# Patient Record
Sex: Female | Born: 1965 | Race: White | Hispanic: No | Marital: Single | Smoking: Never smoker
Health system: Southern US, Community
[De-identification: ages and names within clinical notes are randomized; demographics above are authoritative.]

## PROBLEM LIST (undated history)

## (undated) DIAGNOSIS — F419 Anxiety disorder, unspecified: Secondary | ICD-10-CM

## (undated) DIAGNOSIS — D649 Anemia, unspecified: Secondary | ICD-10-CM

## (undated) DIAGNOSIS — N289 Disorder of kidney and ureter, unspecified: Secondary | ICD-10-CM

## (undated) DIAGNOSIS — C539 Malignant neoplasm of cervix uteri, unspecified: Secondary | ICD-10-CM

## (undated) DIAGNOSIS — I10 Essential (primary) hypertension: Secondary | ICD-10-CM

## (undated) DIAGNOSIS — I82409 Acute embolism and thrombosis of unspecified deep veins of unspecified lower extremity: Secondary | ICD-10-CM

## (undated) DIAGNOSIS — Z87442 Personal history of urinary calculi: Secondary | ICD-10-CM

## (undated) HISTORY — PX: TUBAL LIGATION: SHX77

## (undated) HISTORY — PX: OTHER SURGICAL HISTORY: SHX169

## (undated) HISTORY — PX: TONSILLECTOMY: SUR1361

---

## 2013-01-04 DIAGNOSIS — T83098A Other mechanical complication of other indwelling urethral catheter, initial encounter: Secondary | ICD-10-CM | POA: Insufficient documentation

## 2013-01-04 DIAGNOSIS — I10 Essential (primary) hypertension: Secondary | ICD-10-CM | POA: Insufficient documentation

## 2013-01-04 DIAGNOSIS — C569 Malignant neoplasm of unspecified ovary: Secondary | ICD-10-CM | POA: Insufficient documentation

## 2013-01-04 DIAGNOSIS — N99528 Other complication of other external stoma of urinary tract: Secondary | ICD-10-CM | POA: Insufficient documentation

## 2014-01-25 DIAGNOSIS — R809 Proteinuria, unspecified: Secondary | ICD-10-CM | POA: Insufficient documentation

## 2014-12-26 DIAGNOSIS — IMO0002 Reserved for concepts with insufficient information to code with codable children: Secondary | ICD-10-CM | POA: Insufficient documentation

## 2014-12-26 DIAGNOSIS — Z01818 Encounter for other preprocedural examination: Secondary | ICD-10-CM | POA: Insufficient documentation

## 2016-12-03 ENCOUNTER — Encounter (HOSPITAL_COMMUNITY): Payer: Self-pay | Admitting: *Deleted

## 2016-12-03 ENCOUNTER — Inpatient Hospital Stay (HOSPITAL_COMMUNITY)
Admission: EM | Admit: 2016-12-03 | Discharge: 2016-12-14 | DRG: 252 | Disposition: A | Discharge status: Home / Self Care | Payer: Medicare Other | Attending: Internal Medicine | Admitting: Internal Medicine

## 2016-12-03 DIAGNOSIS — Y92239 Unspecified place in hospital as the place of occurrence of the external cause: Secondary | ICD-10-CM | POA: Diagnosis present

## 2016-12-03 DIAGNOSIS — I120 Hypertensive chronic kidney disease with stage 5 chronic kidney disease or end stage renal disease: Secondary | ICD-10-CM | POA: Diagnosis present

## 2016-12-03 DIAGNOSIS — A419 Sepsis, unspecified organism: Secondary | ICD-10-CM

## 2016-12-03 DIAGNOSIS — N99820 Postprocedural hemorrhage and hematoma of a genitourinary system organ or structure following a genitourinary system procedure: Secondary | ICD-10-CM | POA: Diagnosis present

## 2016-12-03 DIAGNOSIS — Z881 Allergy status to other antibiotic agents status: Secondary | ICD-10-CM | POA: Diagnosis not present

## 2016-12-03 DIAGNOSIS — R001 Bradycardia, unspecified: Secondary | ICD-10-CM | POA: Diagnosis present

## 2016-12-03 DIAGNOSIS — D509 Iron deficiency anemia, unspecified: Secondary | ICD-10-CM | POA: Diagnosis present

## 2016-12-03 DIAGNOSIS — T80212A Local infection due to central venous catheter, initial encounter: Principal | ICD-10-CM | POA: Diagnosis present

## 2016-12-03 DIAGNOSIS — Z923 Personal history of irradiation: Secondary | ICD-10-CM

## 2016-12-03 DIAGNOSIS — T827XXD Infection and inflammatory reaction due to other cardiac and vascular devices, implants and grafts, subsequent encounter: Secondary | ICD-10-CM | POA: Diagnosis not present

## 2016-12-03 DIAGNOSIS — E876 Hypokalemia: Secondary | ICD-10-CM | POA: Diagnosis present

## 2016-12-03 DIAGNOSIS — N2581 Secondary hyperparathyroidism of renal origin: Secondary | ICD-10-CM | POA: Diagnosis present

## 2016-12-03 DIAGNOSIS — Y832 Surgical operation with anastomosis, bypass or graft as the cause of abnormal reaction of the patient, or of later complication, without mention of misadventure at the time of the procedure: Secondary | ICD-10-CM | POA: Diagnosis not present

## 2016-12-03 DIAGNOSIS — Z8543 Personal history of malignant neoplasm of ovary: Secondary | ICD-10-CM

## 2016-12-03 DIAGNOSIS — E860 Dehydration: Secondary | ICD-10-CM | POA: Diagnosis present

## 2016-12-03 DIAGNOSIS — Z7901 Long term (current) use of anticoagulants: Secondary | ICD-10-CM | POA: Diagnosis not present

## 2016-12-03 DIAGNOSIS — R7881 Bacteremia: Secondary | ICD-10-CM

## 2016-12-03 DIAGNOSIS — D631 Anemia in chronic kidney disease: Secondary | ICD-10-CM | POA: Diagnosis present

## 2016-12-03 DIAGNOSIS — Z8614 Personal history of Methicillin resistant Staphylococcus aureus infection: Secondary | ICD-10-CM

## 2016-12-03 DIAGNOSIS — T82868A Thrombosis of vascular prosthetic devices, implants and grafts, initial encounter: Secondary | ICD-10-CM

## 2016-12-03 DIAGNOSIS — Z86718 Personal history of other venous thrombosis and embolism: Secondary | ICD-10-CM

## 2016-12-03 DIAGNOSIS — N186 End stage renal disease: Secondary | ICD-10-CM | POA: Diagnosis present

## 2016-12-03 DIAGNOSIS — Z6841 Body Mass Index (BMI) 40.0 and over, adult: Secondary | ICD-10-CM

## 2016-12-03 DIAGNOSIS — Z841 Family history of disorders of kidney and ureter: Secondary | ICD-10-CM

## 2016-12-03 DIAGNOSIS — B9562 Methicillin resistant Staphylococcus aureus infection as the cause of diseases classified elsewhere: Secondary | ICD-10-CM | POA: Diagnosis present

## 2016-12-03 DIAGNOSIS — Y712 Prosthetic and other implants, materials and accessory cardiovascular devices associated with adverse incidents: Secondary | ICD-10-CM | POA: Diagnosis not present

## 2016-12-03 DIAGNOSIS — T8579XA Infection and inflammatory reaction due to other internal prosthetic devices, implants and grafts, initial encounter: Secondary | ICD-10-CM | POA: Diagnosis present

## 2016-12-03 DIAGNOSIS — Z888 Allergy status to other drugs, medicaments and biological substances status: Secondary | ICD-10-CM

## 2016-12-03 DIAGNOSIS — Z9221 Personal history of antineoplastic chemotherapy: Secondary | ICD-10-CM | POA: Diagnosis not present

## 2016-12-03 DIAGNOSIS — L309 Dermatitis, unspecified: Secondary | ICD-10-CM | POA: Diagnosis present

## 2016-12-03 DIAGNOSIS — G8929 Other chronic pain: Secondary | ICD-10-CM | POA: Diagnosis not present

## 2016-12-03 DIAGNOSIS — E8889 Other specified metabolic disorders: Secondary | ICD-10-CM | POA: Diagnosis present

## 2016-12-03 DIAGNOSIS — K56609 Unspecified intestinal obstruction, unspecified as to partial versus complete obstruction: Secondary | ICD-10-CM | POA: Diagnosis present

## 2016-12-03 DIAGNOSIS — K435 Parastomal hernia without obstruction or  gangrene: Secondary | ICD-10-CM | POA: Diagnosis present

## 2016-12-03 DIAGNOSIS — R197 Diarrhea, unspecified: Secondary | ICD-10-CM | POA: Diagnosis present

## 2016-12-03 DIAGNOSIS — Z992 Dependence on renal dialysis: Secondary | ICD-10-CM | POA: Diagnosis not present

## 2016-12-03 DIAGNOSIS — Z5189 Encounter for other specified aftercare: Secondary | ICD-10-CM

## 2016-12-03 DIAGNOSIS — M549 Dorsalgia, unspecified: Secondary | ICD-10-CM | POA: Diagnosis not present

## 2016-12-03 HISTORY — DX: Acute embolism and thrombosis of unspecified deep veins of unspecified lower extremity: I82.409

## 2016-12-03 HISTORY — DX: Disorder of kidney and ureter, unspecified: N28.9

## 2016-12-03 HISTORY — DX: Malignant neoplasm of cervix uteri, unspecified: C53.9

## 2016-12-03 HISTORY — DX: Anxiety disorder, unspecified: F41.9

## 2016-12-03 LAB — CBC WITH DIFFERENTIAL/PLATELET
BASOS ABS: 0 10*3/uL (ref 0.0–0.1)
BASOS PCT: 0 %
Eosinophils Absolute: 0.2 10*3/uL (ref 0.0–0.7)
Eosinophils Relative: 3 %
HEMATOCRIT: 25 % — AB (ref 36.0–46.0)
HEMOGLOBIN: 8 g/dL — AB (ref 12.0–15.0)
Lymphocytes Relative: 14 %
Lymphs Abs: 1.1 10*3/uL (ref 0.7–4.0)
MCH: 31.1 pg (ref 26.0–34.0)
MCHC: 32 g/dL (ref 30.0–36.0)
MCV: 97.3 fL (ref 78.0–100.0)
MONO ABS: 0.5 10*3/uL (ref 0.1–1.0)
Monocytes Relative: 7 %
NEUTROS ABS: 5.9 10*3/uL (ref 1.7–7.7)
NEUTROS PCT: 76 %
Platelets: 314 10*3/uL (ref 150–400)
RBC: 2.57 MIL/uL — AB (ref 3.87–5.11)
RDW: 13.5 % (ref 11.5–15.5)
WBC: 7.7 10*3/uL (ref 4.0–10.5)

## 2016-12-03 LAB — I-STAT TROPONIN, ED: Troponin i, poc: 0.02 ng/mL (ref 0.00–0.08)

## 2016-12-03 LAB — COMPREHENSIVE METABOLIC PANEL
ALT: 21 U/L (ref 14–54)
ANION GAP: 16 — AB (ref 5–15)
AST: 41 U/L (ref 15–41)
Albumin: 3.1 g/dL — ABNORMAL LOW (ref 3.5–5.0)
Alkaline Phosphatase: 137 U/L — ABNORMAL HIGH (ref 38–126)
BUN: 14 mg/dL (ref 6–20)
CHLORIDE: 93 mmol/L — AB (ref 101–111)
CO2: 28 mmol/L (ref 22–32)
Calcium: 8.5 mg/dL — ABNORMAL LOW (ref 8.9–10.3)
Creatinine, Ser: 4.55 mg/dL — ABNORMAL HIGH (ref 0.44–1.00)
GFR, EST AFRICAN AMERICAN: 12 mL/min — AB (ref >60)
GFR, EST NON AFRICAN AMERICAN: 10 mL/min — AB (ref >60)
Glucose, Bld: 94 mg/dL (ref 65–99)
POTASSIUM: 2.8 mmol/L — AB (ref 3.5–5.1)
SODIUM: 137 mmol/L (ref 135–145)
Total Bilirubin: 0.5 mg/dL (ref 0.3–1.2)
Total Protein: 8.1 g/dL (ref 6.5–8.1)

## 2016-12-03 LAB — I-STAT BETA HCG BLOOD, ED (MC, WL, AP ONLY): I-stat hCG, quantitative: <5 m[IU]/mL (ref <5)

## 2016-12-03 LAB — URINALYSIS, ROUTINE W REFLEX MICROSCOPIC
Bilirubin Urine: NEGATIVE
Glucose, UA: NEGATIVE mg/dL
KETONES UR: NEGATIVE mg/dL
Nitrite: NEGATIVE
PH: 9 — AB (ref 5.0–8.0)
Protein, ur: 100 mg/dL — AB
Specific Gravity, Urine: 1.01 (ref 1.005–1.030)

## 2016-12-03 LAB — I-STAT CG4 LACTIC ACID, ED
LACTIC ACID, VENOUS: 2.77 mmol/L — AB (ref 0.5–1.9)
LACTIC ACID, VENOUS: 2.24 mmol/L — AB (ref 0.5–1.9)

## 2016-12-03 LAB — PROTIME-INR
INR: 1.14
Prothrombin Time: 14.5 seconds (ref 11.4–15.2)

## 2016-12-03 MED ORDER — SODIUM CHLORIDE 0.9 % IV BOLUS (SEPSIS)
500.0000 mL | Freq: Once | INTRAVENOUS | Status: AC
  Administered 2016-12-03: 500 mL via INTRAVENOUS

## 2016-12-03 NOTE — ED Triage Notes (Signed)
Pt had dialysis today. Sent here due to +blood cultures, +staph. Pt having fatigue and weakness x 4 days.

## 2016-12-03 NOTE — ED Notes (Signed)
Pt complaining of chest pain in waiting room.  Took back to triage for reassessment and EKG

## 2016-12-03 NOTE — ED Provider Notes (Signed)
[facility] EMERGENCY DEPARTMENT Provider Note   CSN: [id number] Arrival date & time: 12/03/16  1745     History   Chief Complaint Chief Complaint  Patient presents with  . Abnormal Lab    HPI Judy Kelley is a 51 y.o. female.  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  End-stage renal disease on dialysis.  Patient just moved here from [place].  Patient had a hemodialysis catheter placed in the right upper chest wall.  While her graft is maturing.  Her catheter recently became red, inflamed.  They drew positive blood cultures from it on 11/28, 2 days ago.  Patient has been feeling febrile, fatigued, below her dry weight for the last 5 days.  Patient appears clinically dehydrated, with signs and symptoms of infection.  Past Medical History:  Diagnosis Date  . Anxiety   . DVT (deep venous thrombosis) ([place])   . Renal disorder     There are no active problems to display for this patient.   History reviewed. No pertinent surgical history.  OB History    No data available       Home Medications    Prior to Admission medications   Medication Sig Start Date End Date Taking? Authorizing Provider  acetaminophen (TYLENOL) 500 MG tablet Take 1,000 mg by mouth every 6 (six) hours as needed for mild pain.   Yes [provider]  apixaban (ELIQUIS) 5 MG TABS tablet Take 5 mg by mouth 2 (two) times daily.   Yes [provider]  calcium acetate (PHOSLO) 667 MG capsule Take 667-1,334 mg by mouth See admin instructions. Takes 1334 with meals and 667 with snacks   Yes [provider]  cinacalcet (SENSIPAR) 90 MG tablet Take 90 mg by mouth daily.   Yes [provider]  oxycodone (OXY-IR) 5 MG capsule Take 5 mg by mouth every 4 (four) hours as needed.   Yes [provider]  promethazine (PHENERGAN) 25 MG tablet Take 25  mg by mouth every 6 (six) hours as needed for nausea or vomiting.   Yes [provider]  sertraline (ZOLOFT) 50 MG tablet Take 50 mg by mouth daily.   Yes [provider]    Family History History reviewed. No pertinent family history.  Social History Social History   Tobacco Use  . Smoking status: Never Smoker  Substance Use Topics  . Alcohol use: No    Frequency: Never  . Drug use: No     Allergies   Erythromycin; Ciprofloxacin; Chlorhexidine; and Other   Review of Systems Review of Systems  Constitutional: Positive for activity change and fatigue.  Respiratory: Negative for shortness of breath.   Cardiovascular: Negative for chest pain.  Gastrointestinal: Negative for abdominal pain.     Physical Exam Updated Vital Signs BP 135/75   Pulse 74   Temp 99.2 F (37.3 C) (Oral)   Resp 12   Ht 5\' 7"  (1.702 m)   Wt 116 kg (255 lb 11.7 oz)   SpO2 100%   BMI 40.05 kg/m   Physical Exam  Constitutional: She is oriented to person, place, and time. She appears well-developed.  HENT:  Head: Normocephalic and atraumatic.  Patient has very dry mucous membranes.  Eyes: Right eye exhibits no discharge. Left eye exhibits no discharge.  Cardiovascular: Normal rate and regular rhythm.  Pulmonary/Chest: Effort normal and breath sounds normal.  She has no wheezes. She has no rales.  Site of previous catheter withdrawal in the chest right chest upper wall  Abdominal: Soft. She exhibits no distension. There is no tenderness.  Musculoskeletal:  Graft left upper arm.  Neurological: She is oriented to person, place, and time.  Skin: Skin is warm and dry. She is not diaphoretic.  Psychiatric: She has a normal mood and affect.  Nursing note and vitals reviewed.    ED Treatments / Results  Labs (all labs ordered are listed, but only abnormal results are displayed) Labs Reviewed  COMPREHENSIVE METABOLIC PANEL - Abnormal; Notable for the following components:       Result Value   Potassium 2.8 (*)    Chloride 93 (*)    Creatinine, Ser 4.55 (*)    Calcium 8.5 (*)    Albumin 3.1 (*)    Alkaline Phosphatase 137 (*)    GFR calc non Af Amer 10 (*)    GFR calc Af Amer 12 (*)    Anion gap 16 (*)    All other components within normal limits  CBC WITH DIFFERENTIAL/PLATELET - Abnormal; Notable for the following components:   RBC 2.57 (*)    Hemoglobin 8.0 (*)    HCT 25.0 (*)    All other components within normal limits  URINALYSIS, ROUTINE W REFLEX MICROSCOPIC - Abnormal; Notable for the following components:   APPearance HAZY (*)    pH 9.0 (*)    Hgb urine dipstick SMALL (*)    Protein, ur 100 (*)    Leukocytes, UA LARGE (*)    Bacteria, UA RARE (*)    Squamous Epithelial / LPF 0-5 (*)    Non Squamous Epithelial 0-5 (*)    All other components within normal limits  I-STAT CG4 LACTIC ACID, ED - Abnormal; Notable for the following components:   Lactic Acid, Venous 2.77 (*)    All other components within normal limits  I-STAT CG4 LACTIC ACID, ED - Abnormal; Notable for the following components:   Lactic Acid, Venous 2.24 (*)    All other components within normal limits  CULTURE, BLOOD (ROUTINE X 2)  CULTURE, BLOOD (ROUTINE X 2)  PROTIME-INR  I-STAT BETA HCG BLOOD, ED ([facility], [facility], [facility] ONLY)  I-STAT TROPONIN, ED    EKG  EKG Interpretation None       Radiology No results found.  Procedures Procedures (including critical care time)  Medications Ordered in ED Medications - No data to display   Initial Impression / Assessment and Plan / ED Course  I have reviewed the triage vital signs and the nursing notes.  Pertinent labs & imaging results that were available during my care of the patient were reviewed by me and considered in my medical decision making (see chart for details).     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  End-stage renal  disease on dialysis.  Patient just moved here from [place].  Patient had a hemodialysis catheter placed in the right upper chest wall.  While her graft is maturing.  Her catheter recently became red, inflamed.  They drew positive blood cultures from it on 11/28, 2 days ago.  Patient has been feeling febrile, fatigued, below her dry weight for the last 5 days.  Patient appears clinically dehydrated, with signs and symptoms of infection.  11:13 PM Essentially patient was sent here by Veneta Penton from [facility] for hospitalizations  for positive cultures taken off a catheter 1128.  Blood cultures were redrawn today.  Patient has received 2 doses of Vancomycin (11/27 loading dose, 1,000 mg on 11/30)  but patient is having extreme fatigue, and decreased dry weight.  We will give gentle fluid bolus, 500 cc.  Admit.  Final Clinical Impressions(s) / ED Diagnoses   Final diagnoses:  None    ED Discharge Orders    None       Macarthur Critchley, MD 12/04/16 [phone]

## 2016-12-03 NOTE — ED Notes (Signed)
Santiago Glad @ [facility] notified of abnormal CG-4

## 2016-12-04 ENCOUNTER — Encounter (HOSPITAL_COMMUNITY): Payer: Self-pay | Admitting: Internal Medicine

## 2016-12-04 ENCOUNTER — Other Ambulatory Visit: Payer: Self-pay

## 2016-12-04 DIAGNOSIS — Z8543 Personal history of malignant neoplasm of ovary: Secondary | ICD-10-CM

## 2016-12-04 DIAGNOSIS — Z9221 Personal history of antineoplastic chemotherapy: Secondary | ICD-10-CM

## 2016-12-04 DIAGNOSIS — E876 Hypokalemia: Secondary | ICD-10-CM | POA: Diagnosis present

## 2016-12-04 DIAGNOSIS — N186 End stage renal disease: Secondary | ICD-10-CM

## 2016-12-04 DIAGNOSIS — Z833 Family history of diabetes mellitus: Secondary | ICD-10-CM

## 2016-12-04 DIAGNOSIS — Z992 Dependence on renal dialysis: Secondary | ICD-10-CM

## 2016-12-04 DIAGNOSIS — Z888 Allergy status to other drugs, medicaments and biological substances status: Secondary | ICD-10-CM

## 2016-12-04 DIAGNOSIS — Z841 Family history of disorders of kidney and ureter: Secondary | ICD-10-CM

## 2016-12-04 DIAGNOSIS — G8929 Other chronic pain: Secondary | ICD-10-CM

## 2016-12-04 DIAGNOSIS — Z881 Allergy status to other antibiotic agents status: Secondary | ICD-10-CM

## 2016-12-04 DIAGNOSIS — M549 Dorsalgia, unspecified: Secondary | ICD-10-CM

## 2016-12-04 DIAGNOSIS — Z8041 Family history of malignant neoplasm of ovary: Secondary | ICD-10-CM

## 2016-12-04 LAB — C DIFFICILE QUICK SCREEN W PCR REFLEX
C DIFFICILE (CDIFF) INTERP: NOT DETECTED
C DIFFICILE (CDIFF) TOXIN: NEGATIVE
C Diff antigen: NEGATIVE

## 2016-12-04 LAB — CBC
HCT: 22.1 % — ABNORMAL LOW (ref 36.0–46.0)
Hemoglobin: 7.1 g/dL — ABNORMAL LOW (ref 12.0–15.0)
MCH: 31.4 pg (ref 26.0–34.0)
MCHC: 32.1 g/dL (ref 30.0–36.0)
MCV: 97.8 fL (ref 78.0–100.0)
PLATELETS: 226 10*3/uL (ref 150–400)
RBC: 2.26 MIL/uL — ABNORMAL LOW (ref 3.87–5.11)
RDW: 13.7 % (ref 11.5–15.5)
WBC: 5.4 10*3/uL (ref 4.0–10.5)

## 2016-12-04 LAB — BASIC METABOLIC PANEL
Anion gap: 14 (ref 5–15)
BUN: 20 mg/dL (ref 6–20)
CALCIUM: 8.2 mg/dL — AB (ref 8.9–10.3)
CO2: 28 mmol/L (ref 22–32)
CREATININE: 6.24 mg/dL — AB (ref 0.44–1.00)
Chloride: 96 mmol/L — ABNORMAL LOW (ref 101–111)
GFR calc Af Amer: 8 mL/min — ABNORMAL LOW (ref >60)
GFR, EST NON AFRICAN AMERICAN: 7 mL/min — AB (ref >60)
GLUCOSE: 94 mg/dL (ref 65–99)
Potassium: 3 mmol/L — ABNORMAL LOW (ref 3.5–5.1)
SODIUM: 138 mmol/L (ref 135–145)

## 2016-12-04 LAB — HIV ANTIBODY (ROUTINE TESTING W REFLEX): HIV SCREEN 4TH GENERATION: NONREACTIVE

## 2016-12-04 LAB — VANCOMYCIN, RANDOM: VANCOMYCIN RM: 20

## 2016-12-04 LAB — MRSA PCR SCREENING: MRSA BY PCR: POSITIVE — AB

## 2016-12-04 LAB — VANCOMYCIN, TROUGH: VANCOMYCIN TR: 19 ug/mL (ref 15–20)

## 2016-12-04 MED ORDER — CALCIUM ACETATE (PHOS BINDER) 667 MG PO CAPS: 667.0000 mg | ORAL_CAPSULE | ORAL | Status: DC | PRN

## 2016-12-04 MED ORDER — MUPIROCIN 2 % EX OINT
1.0000 "application " | TOPICAL_OINTMENT | Freq: Two times a day (BID) | CUTANEOUS | Status: AC
  Administered 2016-12-04 – 2016-12-08 (×10): 1 via NASAL
  Filled 2016-12-04 (×3): qty 22

## 2016-12-04 MED ORDER — PROMETHAZINE HCL 25 MG PO TABS: 25.0000 mg | ORAL_TABLET | Freq: Four times a day (QID) | ORAL | Status: DC | PRN

## 2016-12-04 MED ORDER — APIXABAN 5 MG PO TABS
5.0000 mg | ORAL_TABLET | Freq: Two times a day (BID) | ORAL | Status: DC
  Administered 2016-12-04 – 2016-12-05 (×4): 5 mg via ORAL
  Filled 2016-12-04 (×5): qty 1

## 2016-12-04 MED ORDER — OXYCODONE HCL 5 MG PO TABS
5.0000 mg | ORAL_TABLET | ORAL | Status: DC | PRN
  Administered 2016-12-04 – 2016-12-14 (×25): 5 mg via ORAL
  Filled 2016-12-04 (×25): qty 1

## 2016-12-04 MED ORDER — CALCITRIOL 0.5 MCG PO CAPS: 1.0000 ug | ORAL_CAPSULE | ORAL | Status: DC

## 2016-12-04 MED ORDER — CALCITRIOL 0.5 MCG PO CAPS
1.0000 ug | ORAL_CAPSULE | ORAL | Status: DC
  Administered 2016-12-06 – 2016-12-10 (×3): 1 ug via ORAL
  Filled 2016-12-04: qty 2

## 2016-12-04 MED ORDER — ACETAMINOPHEN 325 MG PO TABS
650.0000 mg | ORAL_TABLET | Freq: Four times a day (QID) | ORAL | Status: DC | PRN
  Filled 2016-12-04: qty 2

## 2016-12-04 MED ORDER — CALCIUM ACETATE (PHOS BINDER) 667 MG PO CAPS
1334.0000 mg | ORAL_CAPSULE | Freq: Three times a day (TID) | ORAL | Status: DC
  Administered 2016-12-04 – 2016-12-14 (×19): 1334 mg via ORAL
  Filled 2016-12-04 (×21): qty 2

## 2016-12-04 MED ORDER — ONDANSETRON HCL 4 MG PO TABS
4.0000 mg | ORAL_TABLET | Freq: Four times a day (QID) | ORAL | Status: DC | PRN
  Administered 2016-12-05 – 2016-12-12 (×2): 4 mg via ORAL
  Filled 2016-12-04 (×2): qty 1

## 2016-12-04 MED ORDER — RENA-VITE PO TABS
1.0000 | ORAL_TABLET | Freq: Every day | ORAL | Status: DC
  Administered 2016-12-04 – 2016-12-13 (×10): 1 via ORAL
  Filled 2016-12-04 (×10): qty 1

## 2016-12-04 MED ORDER — POTASSIUM CHLORIDE CRYS ER 20 MEQ PO TBCR
40.0000 meq | EXTENDED_RELEASE_TABLET | Freq: Once | ORAL | Status: AC
  Administered 2016-12-04: 40 meq via ORAL
  Filled 2016-12-04: qty 2

## 2016-12-04 MED ORDER — VANCOMYCIN HCL IN DEXTROSE 1-5 GM/200ML-% IV SOLN
1000.0000 mg | INTRAVENOUS | Status: DC
  Administered 2016-12-06: 1000 mg via INTRAVENOUS
  Filled 2016-12-04 (×2): qty 200

## 2016-12-04 MED ORDER — ACETAMINOPHEN 650 MG RE SUPP: 650.0000 mg | Freq: Four times a day (QID) | RECTAL | Status: DC | PRN

## 2016-12-04 MED ORDER — CINACALCET HCL 30 MG PO TABS
90.0000 mg | ORAL_TABLET | Freq: Every day | ORAL | Status: DC
  Administered 2016-12-04 – 2016-12-14 (×10): 90 mg via ORAL
  Filled 2016-12-04 (×11): qty 3

## 2016-12-04 MED ORDER — SERTRALINE HCL 50 MG PO TABS
50.0000 mg | ORAL_TABLET | Freq: Every day | ORAL | Status: DC
  Administered 2016-12-04 – 2016-12-14 (×11): 50 mg via ORAL
  Filled 2016-12-04 (×11): qty 1

## 2016-12-04 MED ORDER — ONDANSETRON HCL 4 MG/2ML IJ SOLN
4.0000 mg | Freq: Four times a day (QID) | INTRAMUSCULAR | Status: DC | PRN
Start: 2016-12-04 — End: 2016-12-14
  Administered 2016-12-04 – 2016-12-11 (×4): 4 mg via INTRAVENOUS
  Filled 2016-12-04 (×4): qty 2

## 2016-12-04 NOTE — Progress Notes (Signed)
Pt is draining a moderate amount of blood from abdomen incision where there is a "non-functioning drain" per pt. Dressing has been changed twice.   Eleanora Neighbor, RN

## 2016-12-04 NOTE — Progress Notes (Signed)
Initial Nutrition Assessment  DOCUMENTATION CODES:   Morbid obesity  INTERVENTION:  - Continue to encourage PO intakes. - RD will continue to monitor for needs and will provide interventions, if warranted, at follow-up.   NUTRITION DIAGNOSIS:   Inadequate oral intake related to acute illness, nausea, vomiting, diarrhea as evidenced by per patient/family report, meal completion < 25%.  GOAL:   Patient will meet greater than or equal to 90% of their needs  MONITOR:   PO intake, Weight trends, Labs, Skin  REASON FOR ASSESSMENT:   Malnutrition Screening Tool  ASSESSMENT:   51 y.o. female with history of ESRD on hemodialysis, history of DVT, previous history of ovarian cancer in remission who had just recently moved from [place] was found to have redness around her right subclavian dialysis catheter 3 days ago and had associated fever and chills.  Blood cultures were obtained at dialysis center and patient was empirically started on vancomycin.  Patient had received her second dose of vancomycin today at dialysis center.  Cultures came back as staph aureus today and was instructed to come to the ER.  Over the last 2 days patient also has been having poor appetite with nausea vomiting and diarrhea.  Denies any abdominal pain but has chronic pain around the right flank area where her nephrostomy tube is.  Patient's dialysis catheter was removed 2 days ago by vascular surgeon.  BMI indicates morbid obesity. No intakes documented since admission. Breakfast tray at bedside and pt had consumed ~50% of a small cup of grits and other items untouched. Pt reports that for the past 1 week she has felt unwell and that she has felt worse each day until admission. Because of this, she had a decreased appetite and intakes (RD unable to elicit meal or snack intakes PTA at this time). Pt states that she is beginning to feel better and is not experiencing nausea this AM. She wants to "go slow" and not push  herself with eating, but is confident that she will soon be eating at baseline as she feels that antibiotic is working very well for her.  Physical assessment showed no signs of muscle or fat wasting. Pt reports usual dry weight of 120 kg and she feels she last weighed this 1 week PTA. No previous weight hx available in the chart. Per pt report and current weight, she has lost 4 kg/9 lbs (3.4% body weight) in the past 1 week. This is significant for time frame. Unable to state malnutrition at this time as unable to confirm a second criterion for identification.  Medications reviewed; Phoslo TID with meals and PRN with snacks, 90 mg Sensipar with breakfast.  Labs reviewed; K: 3 mmol/L, Cl: 96 mmol/L, creatinine: 6.24 mg/dL, Ca: 8.2 mg/dL, GFR: 7 mL/min.     NUTRITION - FOCUSED PHYSICAL EXAM:  Completed; no findings of muscle or fat wasting at this time.   Diet Order:  Diet renal with fluid restriction Fluid restriction: 1200 mL Fluid; Room service appropriate? Yes; Fluid consistency: Thin  EDUCATION NEEDS:   No education needs have been identified at this time  Skin:  Skin Assessment: Skin Integrity Issues: Skin Integrity Issues:: Incisions Incisions: R abdominal incision 11/30  Last BM:  11/30  Height:   Ht Readings from Last 1 Encounters:  12/04/16 5\' 7"  (1.702 m)    Weight:   Wt Readings from Last 1 Encounters:  12/04/16 256 lb 2.8 oz (116.2 kg)    Ideal Body Weight:  61.36 kg  BMI:  Body mass index is 40.12 kg/m.  Estimated Nutritional Needs:   Kcal:  2100-2300  Protein:  115-130 grams  Fluid:  1 L + UOP/day     Jarome Matin, MS, RD, LDN, [facility] Inpatient Clinical Dietitian Pager # [phone] After hours/weekend pager # [phone]

## 2016-12-04 NOTE — Progress Notes (Signed)
PROGRESS NOTE    Judy Kelley  BJS:[id number] DOB: 24-Mar-1965 DOA: 12/03/2016 PCP: System, Pcp Not In   Chief Complaint  Patient presents with  . Abnormal Lab    Brief Narrative:  HPI on 12/04/2016 by Dr. Gean Birchwood Judy Kelley is a 51 y.o. female with history of ESRD on hemodialysis, history of DVT, previous history of ovarian cancer in remission who had just recently moved from [place] was found to have redness around her right subclavian dialysis catheter 3 days ago and had associated fever and chills.  Blood cultures were obtained at dialysis center and patient was empirically started on vancomycin.  Patient had received her second dose of vancomycin today at dialysis center.  Cultures came back as staph aureus today and was instructed to come to the ER.  Over the last 2 days patient also has been having poor appetite with nausea vomiting and diarrhea.  Denies any abdominal pain but has chronic pain around the right flank area where her nephrostomy tube is.  Patient's dialysis catheter was removed 2 days ago by vascular surgeon.  Assessment & Plan   MRSA bacteremia -Likely secondary to dialysis catheter (right subclavian) that was removed a few days ago prior to admission -Placed on vancomycin -Initial blood cultures obtained as an outpatient, per nephrology showed to be MRSA -Infectious disease consulted and appreciated -Echocardiogram pending  ESRD -Nephrology consulted and appreciated  Hypokalemia -Likely secondary to GI losses, patient did have diarrhea prior to admission -Continue to replace and monitor BMP -will obtain magnesium level  Diarrhea -Subsided prior to admission -C. difficile negative  DVT -Continue Eliquis  History of ovarian cancer -Currently in remission  Normocytic normochromic anemia/anemia of chronic renal disease -Hemoglobin currently 7.1, will transfuse one PRBC  -Patient did have some blood loss from abdominal wound  -continue to  monitor CBC  DVT Prophylaxis  Eliquis  Code Status: Full  Family Communication: None at bedside  Disposition Plan: admitted  Consultants Nephrology Infectious disease  Procedures  None  Antibiotics   Anti-infectives (From admission, onward)   None      Subjective:   Judy Kelley seen and examined today.  Has no complaints today. Denies any further diarrhea. Denies any current chest pain, shortness breath, abdominal pain, nausea or vomiting, dizziness or headache.  Objective:   Vitals:   12/04/16 0015 12/04/16 0030 12/04/16 0117 12/04/16 0925  BP:  112/70 133/86 (!) 104/52  Pulse: 76 78 80 69  Resp: 14 11 19 18   Temp:   98.4 F (36.9 C) 98.6 F (37 C)  TempSrc:   Oral Oral  SpO2: 95% 92% 98% 96%  Weight:   116.2 kg (256 lb 2.8 oz)   Height:   5\' 7"  (1.702 m)     Intake/Output Summary (Last 24 hours) at 12/04/2016 1542 Last data filed at 12/04/2016 1437 Gross per 24 hour  Intake 720 ml  Output 750 ml  Net -30 ml   Filed Weights   12/03/16 2255 12/04/16 0117  Weight: 116 kg (255 lb 11.7 oz) 116.2 kg (256 lb 2.8 oz)    Exam  General: Well developed, well nourished, NAD, appears stated age  HEENT: NCAT, mucous membranes moist.   Cardiovascular: S1 S2 auscultated, no rubs, murmurs or gallops. Regular rate and rhythm.  Respiratory: Clear to auscultation bilaterally with equal chest rise  Abdomen: Soft, obese, nontender, nondistended, + bowel sounds, abdominal wound with dressing in place  Extremities: warm dry without cyanosis clubbing or edema  Neuro: AAOx3,  nonfocal  Psych: Normal affect and demeanor with intact judgement and insight   Data Reviewed: I have personally reviewed following labs and imaging studies  CBC: Recent Labs  Lab 12/03/16 1818 12/04/16 0611  WBC 7.7 5.4  NEUTROABS 5.9  --   HGB 8.0* 7.1*  HCT 25.0* 22.1*  MCV 97.3 97.8  PLT 314 175   Basic Metabolic Panel: Recent Labs  Lab 12/03/16 1818 12/04/16 0611  NA 137  138  K 2.8* 3.0*  CL 93* 96*  CO2 28 28  GLUCOSE 94 94  BUN 14 20  CREATININE 4.55* 6.24*  CALCIUM 8.5* 8.2*   GFR: Estimated Creatinine Clearance: 13.9 mL/min (A) (by C-G formula based on SCr of 6.24 mg/dL (H)). Liver Function Tests: Recent Labs  Lab 12/03/16 1818  AST 41  ALT 21  ALKPHOS 137*  BILITOT 0.5  PROT 8.1  ALBUMIN 3.1*   No results for input(s): LIPASE, AMYLASE in the last 168 hours. No results for input(s): AMMONIA in the last 168 hours. Coagulation Profile: Recent Labs  Lab 12/03/16 1818  INR 1.14   Cardiac Enzymes: No results for input(s): CKTOTAL, CKMB, CKMBINDEX, TROPONINI in the last 168 hours. BNP (last 3 results) No results for input(s): PROBNP in the last 8760 hours. HbA1C: No results for input(s): HGBA1C in the last 72 hours. CBG: No results for input(s): GLUCAP in the last 168 hours. Lipid Profile: No results for input(s): CHOL, HDL, LDLCALC, TRIG, CHOLHDL, LDLDIRECT in the last 72 hours. Thyroid Function Tests: No results for input(s): TSH, T4TOTAL, FREET4, T3FREE, THYROIDAB in the last 72 hours. Anemia Panel: No results for input(s): VITAMINB12, FOLATE, FERRITIN, TIBC, IRON, RETICCTPCT in the last 72 hours. Urine analysis:    Component Value Date/Time   COLORURINE YELLOW 12/03/2016 1955   APPEARANCEUR HAZY (A) 12/03/2016 1955   LABSPEC 1.010 12/03/2016 1955   PHURINE 9.0 (H) 12/03/2016 1955   GLUCOSEU NEGATIVE 12/03/2016 1955   HGBUR SMALL (A) 12/03/2016 [place] NEGATIVE 12/03/2016 [place] NEGATIVE 12/03/2016 1955   PROTEINUR 100 (A) 12/03/2016 1955   NITRITE NEGATIVE 12/03/2016 1955   LEUKOCYTESUR LARGE (A) 12/03/2016 1955   Sepsis Labs: @LABRCNTIP (procalcitonin:4,lacticidven:4)  ) Recent Results (from the past 240 hour(s))  Culture, blood (Routine x 2)     Status: None (Preliminary result)   Collection Time: 12/03/16  6:20 PM  Result Value Ref Range Status   Specimen Description BLOOD LEFT HAND  Final    Special Requests   Final    Blood Culture adequate volume BOTTLES DRAWN AEROBIC AND ANAEROBIC   Culture NO GROWTH < 24 HOURS  Final   Report Status PENDING  Incomplete  Culture, blood (Routine x 2)     Status: None (Preliminary result)   Collection Time: 12/03/16  9:28 PM  Result Value Ref Range Status   Specimen Description BLOOD LEFT ANTECUBITAL  Final   Special Requests   Final    Blood Culture adequate volume BOTTLES DRAWN AEROBIC ONLY   Culture NO GROWTH < 24 HOURS  Final   Report Status PENDING  Incomplete  MRSA PCR Screening     Status: Abnormal   Collection Time: 12/04/16  1:51 AM  Result Value Ref Range Status   MRSA by PCR POSITIVE (A) NEGATIVE Final    Comment:        The GeneXpert MRSA Assay (FDA approved for NASAL specimens only), is one component of a comprehensive MRSA colonization surveillance program. It is not intended to diagnose  MRSA infection nor to guide or monitor treatment for MRSA infections. RESULT CALLED TO, READ BACK BY AND VERIFIED WITH: Fulton Reek RN [phone] 12/04/16 A BROWNING   C difficile quick scan w PCR reflex     Status: None   Collection Time: 12/04/16 10:45 AM  Result Value Ref Range Status   C Diff antigen NEGATIVE NEGATIVE Final   C Diff toxin NEGATIVE NEGATIVE Final   C Diff interpretation No C. difficile detected.  Final      Radiology Studies: No results found.   Scheduled Meds: . apixaban  5 mg Oral BID  . [START ON 12/07/2016] calcitRIOL  1 mcg Oral Q T,Th,Sa-HD  . calcium acetate  1,334 mg Oral TID WC  . cinacalcet  90 mg Oral Q breakfast  . multivitamin  1 tablet Oral QHS  . mupirocin ointment  1 application Nasal BID  . sertraline  50 mg Oral Daily   Continuous Infusions:   LOS: 1 day   Time Spent in minutes   30 minutes  Alizee Maple D.O. on 12/04/2016 at 3:42 PM  Between 7am to 7pm - Pager - [phone]  After 7pm go to [email or web address] - password [email or web address]  And look for the night coverage person covering for me after  hours  [facility] Office  [phone]

## 2016-12-04 NOTE — Progress Notes (Signed)
Pt is positive for MRSA in the nares. Did not order CHG wipes d/t an allergy. Did put in order for Bactroban per positive standing orders.   Eleanora Neighbor, RN

## 2016-12-04 NOTE — Progress Notes (Signed)
New Admission Note:  Arrival Method: By bed from ED around 0130 Mental Orientation: Alert and oriented Telemetry: Box 08, CCMD notified Assessment: Completed Skin: Completed, refer to flowsheets IV: Right AC Pain: 8/10, right lower back pain (chronic) Tubes: Right Lower back nephrostomy, Right chest HD cath Safety Measures: Safety Fall Prevention Plan was given, discussed  Admission: Completed [facility] Orientation: Patient has been orientated to the room, unit and the staff. Family: None  Orders have been reviewed and implemented. Will continue to monitor the patient. Call light has been placed within reach.  Perry Mount, RN  Phone Number: [phone]

## 2016-12-04 NOTE — Consult Note (Signed)
[place] for Infectious Disease       Reason for Consult: MRSA bacteremia    Referring Physician: Dr. Ree Kida  Principal Problem:   Bacteremia Active Problems:   ESRD on dialysis [facility])   Hypokalemia   History of ovarian cancer   . apixaban  5 mg Oral BID  . calcium acetate  1,334 mg Oral TID WC  . cinacalcet  90 mg Oral Q breakfast  . mupirocin ointment  1 application Nasal BID  . sertraline  50 mg Oral Daily    Recommendations: Continue vancomycin with dialysis for 6 weeks through January 10th TTE (ordered) Repeat blood cultures (ordered)  Assessment: She has a report of MRSA blood cultures from the dialysis unit and had a right subclavian dialysis catheter that was removed due to redness, positive blood cultures and she is in a line holiday now.  Graft on right arm without erythema, warmth.    Antibiotics: Vancomycin day 2  HPI: Judy Kelley is a 51 y.o. female with ESRD from reflux disease following remote chemotherapy for ovarian cancer who was sent from dialysis center for above.  Was having fever, fatigue, chills.  Catheter removed.  Poor po, n/v.  No new pain.  Has chronic back pain.  No fever since admission.  WBC wnl.  No associated rash.    Review of Systems:  Constitutional: negative for chills Gastrointestinal: negative for diarrhea Integument/breast: negative for rash All other systems reviewed and are negative    Past Medical History:  Diagnosis Date  . Anxiety   . DVT (deep venous thrombosis) ([facility])   . Renal disorder     Social History   Tobacco Use  . Smoking status: Never Smoker  . Smokeless tobacco: Never Used  Substance Use Topics  . Alcohol use: No    Frequency: Never  . Drug use: No    Family History  Problem Relation Age of Onset  . Diabetes Mellitus II Mother   . Kidney disease Mother   . Diabetes Mellitus II Father   . Ovarian cancer Maternal Aunt     Allergies  Allergen Reactions  . Erythromycin Anaphylaxis  .  Ciprofloxacin     IV only-burning to her veins, can take PO  . Chlorhexidine Rash  . Other Rash    chloraprep    Physical Exam: Constitutional: in no apparent distress and alert  Vitals:   12/04/16 0117 12/04/16 0925  BP: 133/86 (!) 104/52  Pulse: 80 69  Resp: 19 18  Temp: 98.4 F (36.9 C) 98.6 F (37 C)  SpO2: 98% 96%   EYES: anicteric ENMT: no thrush Cardiovascular: Cor RRR Respiratory: CTA B; normal respiratory effort GI: Bowel sounds are normal, liver is not enlarged, spleen is not enlarged Musculoskeletal: no pedal edema noted Skin: negatives: no rash Hematologic: no cervical lad GU: + nephrostomy tubes  Lab Results  Component Value Date   WBC 5.4 12/04/2016   HGB 7.1 (L) 12/04/2016   HCT 22.1 (L) 12/04/2016   MCV 97.8 12/04/2016   PLT 226 12/04/2016    Lab Results  Component Value Date   CREATININE 6.24 (H) 12/04/2016   BUN 20 12/04/2016   NA 138 12/04/2016   K 3.0 (L) 12/04/2016   CL 96 (L) 12/04/2016   CO2 28 12/04/2016    Lab Results  Component Value Date   ALT 21 12/03/2016   AST 41 12/03/2016   ALKPHOS 137 (H) 12/03/2016     Microbiology: Recent Results (from the  past 240 hour(s))  Culture, blood (Routine x 2)     Status: None (Preliminary result)   Collection Time: 12/03/16  6:20 PM  Result Value Ref Range Status   Specimen Description BLOOD LEFT HAND  Final   Special Requests   Final    Blood Culture adequate volume BOTTLES DRAWN AEROBIC AND ANAEROBIC   Culture NO GROWTH < 24 HOURS  Final   Report Status PENDING  Incomplete  Culture, blood (Routine x 2)     Status: None (Preliminary result)   Collection Time: 12/03/16  9:28 PM  Result Value Ref Range Status   Specimen Description BLOOD LEFT ANTECUBITAL  Final   Special Requests   Final    Blood Culture adequate volume BOTTLES DRAWN AEROBIC ONLY   Culture NO GROWTH < 24 HOURS  Final   Report Status PENDING  Incomplete  MRSA PCR Screening     Status: Abnormal   Collection Time:  12/04/16  1:51 AM  Result Value Ref Range Status   MRSA by PCR POSITIVE (A) NEGATIVE Final    Comment:        The GeneXpert MRSA Assay (FDA approved for NASAL specimens only), is one component of a comprehensive MRSA colonization surveillance program. It is not intended to diagnose MRSA infection nor to guide or monitor treatment for MRSA infections. RESULT CALLED TO, READ BACK BY AND VERIFIED WITH: Fulton Reek RN [phone] 12/04/16 A BROWNING   C difficile quick scan w PCR reflex     Status: None   Collection Time: 12/04/16 10:45 AM  Result Value Ref Range Status   C Diff antigen NEGATIVE NEGATIVE Final   C Diff toxin NEGATIVE NEGATIVE Final   C Diff interpretation No C. difficile detected.  Final    Antawan Mchugh, Herbie [place], [place] for Infectious Disease [place] [email or web address] [phone] pager  [phone] cell 12/04/2016, 2:23 PM

## 2016-12-04 NOTE — H&P (Signed)
History and Physical    [name] IBB:[id number] DOB: 1965/03/30 DOA: 12/03/2016  PCP: System, Pcp Not In  Patient coming from: Home.  Chief Complaint: Blood cultures were positive.  HPI: Judy Kelley is a 51 y.o. female with history of ESRD on hemodialysis, history of DVT, previous history of ovarian cancer in remission who had just recently moved from [place] was found to have redness around her right subclavian dialysis catheter 3 days ago and had associated fever and chills.  Blood cultures were obtained at dialysis center and patient was empirically started on vancomycin.  Patient had received her second dose of vancomycin today at dialysis center.  Cultures came back as staph aureus today and was instructed to come to the ER.  Over the last 2 days patient also has been having poor appetite with nausea vomiting and diarrhea.  Denies any abdominal pain but has chronic pain around the right flank area where her nephrostomy tube is.  Patient's dialysis catheter was removed 2 days ago by vascular surgeon.  ED Course: In the ER patient had repeat labs done including blood cultures.  Patient had brought a paper from her dialysis center stating that her culture has grown staph aureus.  Labs revealed hypokalemia and normocytic normochromic anemia.  No other labs to compare.  Patient was started on vancomycin and admitted for further management of staph aureus bacteremia.  Review of Systems: As per HPI, rest all negative.   Past Medical History:  Diagnosis Date  . Anxiety   . DVT (deep venous thrombosis) ([place])   . Renal disorder     Past Surgical History:  Procedure Laterality Date  . ileal conduit    . nephrosotomy    . TONSILLECTOMY    . TUBAL LIGATION       reports that  has never smoked. she has never used smokeless tobacco. She reports that she does not drink alcohol or use drugs.  Allergies  Allergen Reactions  . Erythromycin Anaphylaxis  . Ciprofloxacin     IV  only-burning to her veins, can take PO  . Chlorhexidine Rash  . Other Rash    chloraprep    Family History  Problem Relation Age of Onset  . Diabetes Mellitus II Mother   . Kidney disease Mother   . Diabetes Mellitus II Father   . Ovarian cancer Maternal Aunt     Prior to Admission medications   Medication Sig Start Date End Date Taking? Authorizing Provider  acetaminophen (TYLENOL) 500 MG tablet Take 1,000 mg by mouth every 6 (six) hours as needed for mild pain.   Yes [provider]  apixaban (ELIQUIS) 5 MG TABS tablet Take 5 mg by mouth 2 (two) times daily.   Yes [provider]  calcium acetate (PHOSLO) 667 MG capsule Take 667-1,334 mg by mouth See admin instructions. Takes 1334 with meals and 667 with snacks   Yes [provider]  cinacalcet (SENSIPAR) 90 MG tablet Take 90 mg by mouth daily.   Yes [provider]  oxycodone (OXY-IR) 5 MG capsule Take 5 mg by mouth every 4 (four) hours as needed.   Yes [provider]  promethazine (PHENERGAN) 25 MG tablet Take 25 mg by mouth every 6 (six) hours as needed for nausea or vomiting.   Yes [provider]  sertraline (ZOLOFT) 50 MG tablet Take 50 mg by mouth daily.   Yes [provider]    Physical Exam: Vitals:   12/03/16 2241 12/03/16 2252  12/03/16 2254 12/03/16 2255  BP:  135/75    Pulse: 82 74    Resp: 19 12    Temp:      TempSrc:      SpO2: 95% 93% 100%   Weight:    116 kg (255 lb 11.7 oz)  Height:    5\' 7"  (1.702 m)      Constitutional: Moderately built and nourished. Vitals:   12/03/16 2241 12/03/16 2252 12/03/16 2254 12/03/16 2255  BP:  135/75    Pulse: 82 74    Resp: 19 12    Temp:      TempSrc:      SpO2: 95% 93% 100%   Weight:    116 kg (255 lb 11.7 oz)  Height:    5\' 7"  (1.702 m)   Eyes: Anicteric no pallor. ENMT: No discharge from the ears eyes nose or mouth. Neck: No mass felt.  No neck rigidity.  No JVD appreciated. Respiratory: No  rhonchi or crepitations. Cardiovascular: S1-S2 heard no murmurs appreciated. Abdomen: Soft nontender bowel sounds present. Musculoskeletal: No edema.  No joint effusion. Skin: No rash.  Skin appears warm. Neurologic: Alert awake oriented to time place and person.  Moves all extremities. Psychiatric: Appears normal.  Normal affect.   Labs on Admission: I have personally reviewed following labs and imaging studies  CBC: Recent Labs  Lab 12/03/16 1818  WBC 7.7  NEUTROABS 5.9  HGB 8.0*  HCT 25.0*  MCV 97.3  PLT 782   Basic Metabolic Panel: Recent Labs  Lab 12/03/16 1818  NA 137  K 2.8*  CL 93*  CO2 28  GLUCOSE 94  BUN 14  CREATININE 4.55*  CALCIUM 8.5*   GFR: Estimated Creatinine Clearance: 19 mL/min (A) (by C-G formula based on SCr of 4.55 mg/dL (H)). Liver Function Tests: Recent Labs  Lab 12/03/16 1818  AST 41  ALT 21  ALKPHOS 137*  BILITOT 0.5  PROT 8.1  ALBUMIN 3.1*   No results for input(s): LIPASE, AMYLASE in the last 168 hours. No results for input(s): AMMONIA in the last 168 hours. Coagulation Profile: Recent Labs  Lab 12/03/16 1818  INR 1.14   Cardiac Enzymes: No results for input(s): CKTOTAL, CKMB, CKMBINDEX, TROPONINI in the last 168 hours. BNP (last 3 results) No results for input(s): PROBNP in the last 8760 hours. HbA1C: No results for input(s): HGBA1C in the last 72 hours. CBG: No results for input(s): GLUCAP in the last 168 hours. Lipid Profile: No results for input(s): CHOL, HDL, LDLCALC, TRIG, CHOLHDL, LDLDIRECT in the last 72 hours. Thyroid Function Tests: No results for input(s): TSH, T4TOTAL, FREET4, T3FREE, THYROIDAB in the last 72 hours. Anemia Panel: No results for input(s): VITAMINB12, FOLATE, FERRITIN, TIBC, IRON, RETICCTPCT in the last 72 hours. Urine analysis:    Component Value Date/Time   COLORURINE YELLOW 12/03/2016 1955   APPEARANCEUR HAZY (A) 12/03/2016 1955   LABSPEC 1.010 12/03/2016 1955   PHURINE 9.0 (H)  12/03/2016 1955   GLUCOSEU NEGATIVE 12/03/2016 1955   HGBUR SMALL (A) 12/03/2016 [place] NEGATIVE 12/03/2016 [place] NEGATIVE 12/03/2016 1955   PROTEINUR 100 (A) 12/03/2016 1955   NITRITE NEGATIVE 12/03/2016 1955   LEUKOCYTESUR LARGE (A) 12/03/2016 1955   Sepsis Labs: @LABRCNTIP (procalcitonin:4,lacticidven:4) )No results found for this or any previous visit (from the past 240 hour(s)).   Radiological Exams on Admission: No results found.  EKG: Independently reviewed.  Normal sinus rhythm.  Assessment/Plan Principal Problem:   Bacteremia Active Problems:  ESRD on dialysis ([place])   Hypokalemia   History of ovarian cancer    1. Staph aureus bacteremia -likely source could be dialysis catheter.  That has been already removed.  Patient is presently on vancomycin.  Check 2D echo.  Repeat blood cultures have been obtained.  Consult infectious disease in a.m. 2. ESRD on hemodialysis on Monday Wednesday and Friday has had dialysis today.  Consult nephrology for dialysis. 3. Hypokalemia likely from diarrhea -follow metabolic panel. 4. Diarrhea -patient has had diarrhea with poor appetite last 2 days.  Abdomen appears benign.  Check stool for C. Difficile. 5. History of DVT on apixaban. 6. History of ovarian cancer in remission. 7. Normocytic normochromic anemia likely from renal disease no old labs to compare follow CBC.   DVT prophylaxis: Apixaban. Code Status: Full code. Family Communication: Discussed with patient. Disposition Plan: Home. Consults called: None. Admission status: Inpatient.   Rise Patience MD [facility] Pager [phone].  If 7PM-7AM, please contact night-coverage [email or web address] Password [email or web address]  12/04/2016, 12:07 AM

## 2016-12-04 NOTE — Consult Note (Signed)
[facility] Renal Consultation Note    Indication for Consultation:  Management of ESRD/hemodialysis; anemia, hypertension/volume and secondary hyperparathyroidism Referring physician: [facility]  HPI: Judy Kelley is a 51 y.o. 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  She moved to [place] last month from [place] and has been dialyzing at [place].  Her PMHx is significatn for HTN, obesity, hx ileal conduit 2002, right neprhostomy tube, left neprectomy/cystectomy, CAD, prior DVT on Eliquis, hx ovarian cancer and radiation who has been receiving Vancomycin at her outpatient HD unit for MRSA bacteremia 11/27 culture  thought secondary to catheter sepsis.  The catheter has been removed (had pus (also MRSA +) coming from the exit and the staff has been dialyzing with her [place].  She missed 11/29 HD due to feeling badly and came 11/30.  She was below her EDW and felt pre-syncopal/dizzy.Her catheter was removed 11/28.  She was kept even on HD 11/30 .  Her post wt was 116 (EDW 117.5).  EDW had been lowered from 120 11/27.  She was 99.5 pre HD and ran her full treatment. She had been on chronic Bactrim DS 1/2 tab per day for UTI suppression.  She is still anorexic with nausea and diarrhea.  There is no SOB, CP, chills.  She has had bloody drainage from old nonfunctional ileal conduit site that is nonfunctional. States this comes and goes.  She  Also has a ventral hernia.  She has a right nephrostomy tube and has an initial visit scheduled with urology 12/5 (her tube was being changed every two weeks. She tells me she had MRSA about 9 years ago but does not recall the source.  This was prior to starting HD.  Evaluation since admissions showed neg C diff Hgb 7.1 Vanc 19, MRSA pcr + urine culture pending WBC 7.1 with normal diff, PLTS 226, K 3 this am (had HD yesterday).  Past Medical History:  Diagnosis Date  . Anxiety   . DVT (deep  venous thrombosis) ([place])   . Renal disorder    Past Surgical History:  Procedure Laterality Date  . ileal conduit    . nephrosotomy    . TONSILLECTOMY    . TUBAL LIGATION     Family History  Problem Relation Age of Onset  . Diabetes Mellitus II Mother   . Kidney disease Mother   . Diabetes Mellitus II Father   . Ovarian cancer Maternal Aunt    Social History:  reports that  has never smoked. she has never used smokeless tobacco. She reports that she does not drink alcohol or use drugs. Allergies  Allergen Reactions  . Erythromycin Anaphylaxis  . Ciprofloxacin     IV only-burning to her veins, can take PO  . Chlorhexidine Rash  . Other Rash    chloraprep   Prior to Admission medications   Medication Sig Start Date End Date Taking? Authorizing Provider  acetaminophen (TYLENOL) 500 MG tablet Take 1,000 mg by mouth every 6 (six) hours as needed for mild pain.   Yes [provider]  apixaban (ELIQUIS) 5 MG TABS tablet Take 5 mg by mouth 2 (two) times daily.   Yes [provider]  calcium acetate (PHOSLO) 667 MG capsule Take 667-1,334 mg by mouth See admin instructions. Takes 1334 with meals and 667 with snacks   Yes [provider]  cinacalcet (SENSIPAR) 90 MG tablet Take 90 mg by mouth daily.   Yes [provider]  oxycodone (OXY-IR) 5 MG capsule Take 5 mg by mouth every 4 (four) hours as needed.   Yes [provider]  promethazine (PHENERGAN) 25 MG tablet Take 25 mg by mouth every 6 (six) hours as needed for nausea or vomiting.   Yes [provider]  sertraline (ZOLOFT) 50 MG tablet Take 50 mg by mouth daily.   Yes [provider]   Current Facility-Administered Medications  Medication Dose Route Frequency Provider Last Rate Last Dose  . acetaminophen (TYLENOL) tablet 650 mg  650 mg Oral Q6H PRN Rise Patience, MD       Or  . acetaminophen (TYLENOL) suppository 650 mg  650 mg Rectal Q6H PRN Rise Patience, MD      . apixaban Arne [facility]) tablet 5 mg  5 mg Oral BID Rise Patience, MD   5 mg at 12/04/16 1039  . calcium acetate (PHOSLO) capsule 1,334 mg  1,334 mg Oral TID WC Rise Patience, MD      . calcium acetate (PHOSLO) capsule 667 mg  667 mg Oral PRN Rise Patience, MD      . cinacalcet (SENSIPAR) tablet 90 mg  90 mg Oral Q breakfast Rise Patience, MD   90 mg at 12/04/16 1039  . mupirocin ointment (BACTROBAN) 2 % 1 application  1 application Nasal BID Rise Patience, MD   1 application at 57/84/69 1117  . ondansetron (ZOFRAN) tablet 4 mg  4 mg Oral Q6H PRN Rise Patience, MD       Or  . ondansetron [facility]) injection 4 mg  4 mg Intravenous Q6H PRN Rise Patience, MD   4 mg at 12/04/16 1322  . oxyCODONE (Oxy IR/ROXICODONE) immediate release tablet 5 mg  5 mg Oral Q4H PRN Rise Patience, MD   5 mg at 12/04/16 1116  . promethazine (PHENERGAN) tablet 25 mg  25 mg Oral Q6H PRN Rise Patience, MD      . sertraline (ZOLOFT) tablet 50 mg  50 mg Oral Daily Rise Patience, MD   50 mg at 12/04/16 1039   Labs: Basic Metabolic Panel: Recent Labs  Lab 12/03/16 1818 12/04/16 0611  NA 137 138  K 2.8* 3.0*  CL 93* 96*  CO2 28 28  GLUCOSE 94 94  BUN 14 20  CREATININE 4.55* 6.24*  CALCIUM 8.5* 8.2*   Liver Function Tests: Recent Labs  Lab 12/03/16 1818  AST 41  ALT 21  ALKPHOS 137*  BILITOT 0.5  PROT 8.1  ALBUMIN 3.1*   CBC: Recent Labs  Lab 12/03/16 1818 12/04/16 0611  WBC 7.7 5.4  NEUTROABS 5.9  --   HGB 8.0* 7.1*  HCT 25.0* 22.1*  MCV 97.3 97.8  PLT 314 226    ROS: As per HPI otherwise negative.   Physical Exam: Vitals:   12/04/16 0015 12/04/16 0030 12/04/16 0117 12/04/16 0925  BP:  112/70 133/86 (!) 104/52  Pulse: 76 78 80 69  Resp: 14 11 19 18   Temp:   98.4 F (36.9 C) 98.6 F (37 C)  TempSrc:   Oral Oral  SpO2: 95% 92% 98% 96%  Weight:   116.2 kg (256 lb 2.8 oz)   Height:   5\' 7"  (1.702 m)       General: pale WF obese NAD  Head: NCAT sclera not icteric MMM Neck: Supple. Old right IJ exit site - healing well Lungs: CTA bilaterally without wheezes, rales, or rhonchi. Breathing is unlabored. Heart:  RRR no rub Back: right nephrostomy tube - no drainage or eythema at exit Abdomen: soft NT + BS; Mid abdominal dressing with bloody drainage from old wound site;ventral hernia Lower extremities:without edema or ischemic changes, no open wounds  Neuro: A & O  X 3. Moves all extremities spontaneously. Psych:  Responds to questions appropriately with a normal affect. Dialysis Access:right upper AVGG + bruit no evidence of infection  Dialysis Orders: [facility] 4 hr EDW 117.5 2 K 2 Ca AVGG heparin 5000 venofer 50 mircera 100 q 2 weeks - last dose 11/27 - calcitriol 1- no Fe right upper AVGG Recent labs: hgb 7.7 dropping 11% sat, no ferritin, no K ipTH 461  Assessment/Plan: 1. MRSA bacteremia thought secondary to catheter sepsis (+ BC and cath exit drainage cultures 11/27).  Received 1.5 gm Vanc post HD 11/27 and another 1 gm 11/30. Vanc level today 19. ID has seen TTE ordered + repeat BC; pharmacy to dose Vanc 2. ESRD -  TTS  K 3 post HD - intake has been poor- losing weight- can defer next HD until Monday and then get back on schedule next week- hold heparin due to bloody abd drainage 3. Hypertension/volume  - not on BP meds - not an issues - losing weight 4. Anemia  - hgb 7.1 Fe def anemia - tsat 11% hgb 10 when she [facility] at [facility] down to 9.2 then 7.7-Mircera not started unit 100 11/27 - hgb likely low due to Fe and ESA deficit. Check FOBT. given bacteremia - likely will need transfusion and then repletion of IV Fe CBC in am 5. Metabolic bone disease -  Continue calcitriol, sensipar 90, calcium acetate  6. Nutrition - add multivitamin - hold on nepro due to diarrhea - not eating much alb 3.1 7. Hx DVT - on Eliquis- she is not sure why she is on this vs coumadin - had been on coumadin a long time  ago. 8. Abdominal wound - old ileal conduit site with blood drainage; also source for bacteria-  eval at least by wound care- have ordered 9. Chronic R nephrostomy tube- has appt with urology 12/5 to establish a relationship since newly moved to the area 10. Hx ovarian cancer s/p prior chemo/radiation/surgery  Myriam Jacobson, PA-C Meredith Kilbride [phone] 12/04/2016, 2:57 PM   Pt seen, examined and agree w A/P as above.  Kelly Splinter MD [employer] pager [phone]   12/04/2016, 4:20 PM

## 2016-12-04 NOTE — Progress Notes (Signed)
Pharmacy Antibiotic Note  Judy Kelley is a 51 y.o. female admitted on 12/03/2016 with bacteremia.  Pharmacy has been consulted for vancomycin dosing.  Patient is ESRD on HD TuThSat. Per written records patient brought from HD: blood cx drawn on 11/27 and catheter tip culture from 11/28 + for staph aureus.   Patient received 1.5g IV 11/27 with HD and 1g IV with HD on 11/30.   Tmax 99.2, WBC 7.7, and LA 2.24. Patient reports her HD sessions have been full sessions.   Plan: Vancomycin random in AM Future doses based on random vancomycin level  Vancomycin trough at SS and PRN (goal pre-HD 15-25 mcg/mL and post-HD 5-15 mcg/mL) Monitor culture data and clinical picture F/u HD schedule and tolerance   Height: 5\' 7"  (170.2 cm) Weight: 255 lb 11.7 oz (116 kg) IBW/kg (Calculated) : 61.6  Temp (24hrs), Avg:98.7 F (37.1 C), Min:98.1 F (36.7 C), Max:99.2 F (37.3 C)  Recent Labs  Lab 12/03/16 1818 12/03/16 1843 12/03/16 2138  WBC 7.7  --   --   CREATININE 4.55*  --   --   LATICACIDVEN  --  2.77* 2.24*    Estimated Creatinine Clearance: 19 mL/min (A) (by C-G formula based on SCr of 4.55 mg/dL (H)).    Allergies  Allergen Reactions  . Erythromycin Anaphylaxis  . Ciprofloxacin     IV only-burning to her veins, can take PO  . Chlorhexidine Rash  . Other Rash    chloraprep    Antimicrobials this admission: 11/27 Vanc >>  Microbiology results: 11/27 (OSH) BCx: GPCs in clusters/staph aureus  11/28 (OSH) cath tip culture: MRSA   Lavonda Jumbo, PharmD Clinical Pharmacist 12/04/16 1:12 AM

## 2016-12-05 ENCOUNTER — Encounter (HOSPITAL_COMMUNITY): Payer: Self-pay | Admitting: Urology

## 2016-12-05 ENCOUNTER — Other Ambulatory Visit (HOSPITAL_COMMUNITY): Payer: Medicare Other

## 2016-12-05 ENCOUNTER — Inpatient Hospital Stay (HOSPITAL_COMMUNITY): Payer: Medicare Other

## 2016-12-05 DIAGNOSIS — Z992 Dependence on renal dialysis: Secondary | ICD-10-CM

## 2016-12-05 LAB — CBC
HCT: 21.4 % — ABNORMAL LOW (ref 36.0–46.0)
HEMOGLOBIN: 6.8 g/dL — AB (ref 12.0–15.0)
MCH: 31.2 pg (ref 26.0–34.0)
MCHC: 31.8 g/dL (ref 30.0–36.0)
MCV: 98.2 fL (ref 78.0–100.0)
Platelets: 336 10*3/uL (ref 150–400)
RBC: 2.18 MIL/uL — AB (ref 3.87–5.11)
RDW: 13.6 % (ref 11.5–15.5)
WBC: 5.6 10*3/uL (ref 4.0–10.5)

## 2016-12-05 LAB — HEPARIN LEVEL (UNFRACTIONATED)

## 2016-12-05 LAB — URINE CULTURE: Culture: NO GROWTH

## 2016-12-05 LAB — BASIC METABOLIC PANEL
Anion gap: 13 (ref 5–15)
BUN: 32 mg/dL — AB (ref 6–20)
CO2: 27 mmol/L (ref 22–32)
Calcium: 8.7 mg/dL — ABNORMAL LOW (ref 8.9–10.3)
Chloride: 97 mmol/L — ABNORMAL LOW (ref 101–111)
Creatinine, Ser: 7.95 mg/dL — ABNORMAL HIGH (ref 0.44–1.00)
GFR calc Af Amer: 6 mL/min — ABNORMAL LOW (ref >60)
GFR calc non Af Amer: 5 mL/min — ABNORMAL LOW (ref >60)
Glucose, Bld: 79 mg/dL (ref 65–99)
POTASSIUM: 3.8 mmol/L (ref 3.5–5.1)
SODIUM: 137 mmol/L (ref 135–145)

## 2016-12-05 LAB — PREPARE RBC (CROSSMATCH)

## 2016-12-05 LAB — MAGNESIUM: Magnesium: 2.3 mg/dL (ref 1.7–2.4)

## 2016-12-05 LAB — APTT: APTT: 40 s — AB (ref 24–36)

## 2016-12-05 LAB — ABO/RH: ABO/RH(D): A POS

## 2016-12-05 MED ORDER — IOPAMIDOL (ISOVUE-300) INJECTION 61%
INTRAVENOUS | Status: AC
  Administered 2016-12-05: 100 mL
  Filled 2016-12-05: qty 100

## 2016-12-05 MED ORDER — SODIUM CHLORIDE 0.9 % IV SOLN
Freq: Once | INTRAVENOUS | Status: AC
  Administered 2016-12-05: 11:00:00 via INTRAVENOUS

## 2016-12-05 MED ORDER — HEPARIN (PORCINE) IN NACL 100-0.45 UNIT/ML-% IJ SOLN
1900.0000 [IU]/h | INTRAMUSCULAR | Status: DC
  Administered 2016-12-05: 1400 [IU]/h via INTRAVENOUS
  Administered 2016-12-06: 1550 [IU]/h via INTRAVENOUS
  Administered 2016-12-07 – 2016-12-08 (×2): 1900 [IU]/h via INTRAVENOUS
  Filled 2016-12-05 (×4): qty 250

## 2016-12-05 MED ORDER — DIPHENHYDRAMINE HCL 25 MG PO CAPS
25.0000 mg | ORAL_CAPSULE | Freq: Four times a day (QID) | ORAL | Status: DC | PRN
  Administered 2016-12-06 – 2016-12-10 (×2): 25 mg via ORAL
  Filled 2016-12-05 (×2): qty 1

## 2016-12-05 NOTE — Consult Note (Signed)
Subjective: CC: Bleeding from conduit.  Hx: Ms. Fogal is a 51 yo WF who I was asked to see in consultation by Dr. 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   The bleeding started about 5 days and was associated with onset of symptoms from an infected dialysis catheter.   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   She had ureteral fibrosis following treatment and intially was treated with an ileal conduit diversion but 11 years ago she was converted to bilateral percutaneous nephrostomy tubes.   She progressed to ESRD and has been on dialysis for 3 years.   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    She recently moved to the area from [place].   ROS:  Review of Systems  Constitutional: Negative for fever.  Gastrointestinal: Negative for abdominal pain.  All other systems reviewed and are negative.   Allergies  Allergen Reactions  . Erythromycin Anaphylaxis  . Ciprofloxacin     IV only-burning to her veins, can take PO  . Chlorhexidine Rash  . Other Rash    chloraprep    Past Medical History:  Diagnosis Date  . Anxiety   . DVT (deep venous thrombosis) ([place])   . Renal disorder     Past Surgical History:  Procedure Laterality Date  . ileal conduit    . nephrosotomy    . TONSILLECTOMY    . TUBAL LIGATION      Social History   Socioeconomic History  . Marital status: Single    Spouse name: Not on file  . Number of children: Not on file  . Years of education: Not on file  . Highest education level: Not on file  Social Needs  . Financial resource strain: Not on file  . Food insecurity - worry: Not on file  . Food insecurity - inability: Not on file  . Transportation needs - medical: Not on file  . Transportation needs - non-medical: Not on file  Occupational History  . Not on file  Tobacco Use  . Smoking status: Never Smoker  . Smokeless tobacco: Never Used  Substance  and Sexual Activity  . Alcohol use: No    Frequency: Never  . Drug use: No  . Sexual activity: Not on file  Other Topics Concern  . Not on file  Social History Narrative  . Not on file    Family History  Problem Relation Age of Onset  . Diabetes Mellitus II Mother   . Kidney disease Mother   . Diabetes Mellitus II Father   . Ovarian cancer Maternal Aunt     Anti-infectives: Anti-infectives (From admission, onward)   Start     Dose/Rate Route Frequency Ordered Stop   12/06/16 1200  vancomycin (VANCOCIN) IVPB 1000 mg/200 mL premix     1,000 mg 200 mL/hr over 60 Minutes Intravenous Every M-W-F (Hemodialysis) 12/04/16 1708        Current Facility-Administered Medications  Medication Dose Route Frequency Provider Last Rate Last Dose  . 0.9 %  sodium chloride infusion   Intravenous Once Cristal Ford, DO      . acetaminophen (TYLENOL) tablet 650 mg  650 mg Oral Q6H PRN Rise Patience, MD       Or  . acetaminophen (TYLENOL) suppository 650 mg  650 mg Rectal Q6H PRN Rise Patience, MD      . apixaban [facility]) tablet 5 mg  5 mg Oral BID Rise Patience, MD   5 mg at 12/05/16 0941  . [START ON 12/06/2016] calcitRIOL (ROCALTROL) capsule 1 mcg  1 mcg Oral Q M,W,F-HD Mikhail, Velta Addison, DO      . calcium acetate (PHOSLO) capsule 1,334 mg  1,334 mg Oral TID WC Rise Patience, MD   1,334 mg at 12/05/16 0941  . calcium acetate (PHOSLO) capsule 667 mg  667 mg Oral PRN Rise Patience, MD      . cinacalcet [facility]) tablet 90 mg  90 mg Oral Q breakfast Rise Patience, MD   90 mg at 12/05/16 0940  . multivitamin (RENA-VIT) tablet 1 tablet  1 tablet Oral QHS Alric Seton, PA-C   1 tablet at 12/04/16 2102  . mupirocin ointment (BACTROBAN) 2 % 1 application  1 application Nasal BID Rise Patience, MD   1 application at 29/47/65 [phone]  . ondansetron (ZOFRAN) tablet 4 mg  4 mg Oral Q6H PRN Rise Patience, MD   4 mg at 12/05/16 3546   Or  .  ondansetron (ZOFRAN) injection 4 mg  4 mg Intravenous Q6H PRN Rise Patience, MD   4 mg at 12/04/16 1322  . oxyCODONE (Oxy IR/ROXICODONE) immediate release tablet 5 mg  5 mg Oral Q4H PRN Rise Patience, MD   5 mg at 12/05/16 5681  . promethazine (PHENERGAN) tablet 25 mg  25 mg Oral Q6H PRN Rise Patience, MD      . sertraline (ZOLOFT) tablet 50 mg  50 mg Oral Daily Rise Patience, MD   50 mg at 12/05/16 0941  . [START ON 12/06/2016] vancomycin (VANCOCIN) IVPB 1000 mg/200 mL premix  1,000 mg Intravenous Q M,W,F-HD Mikhail, Velta Addison, DO         Objective: Vital signs in last 24 hours: Temp:  [97.5 F (36.4 C)-98.4 F (36.9 C)] 97.5 F (36.4 C) (12/02 0910) Pulse Rate:  [55-60] 55 (12/02 0910) Resp:  [14-18] 16 (12/02 0910) BP: (96-116)/(52-61) 98/61 (12/02 0910) SpO2:  [91 %-97 %] 96 % (12/02 0910)  Intake/Output from previous day: 12/01 0701 - 12/02 0700 In: 1320 [P.O.:1320] Out: 700 [Urine:300; Stool:400] Intake/Output this shift: No intake/output data recorded.   Physical Exam  Constitutional: She is oriented to person, place, and time.  Cardiovascular: Normal rate and regular rhythm.  Pulmonary/Chest: Effort normal. No respiratory distress.  Abdominal: Soft. There is no tenderness.  RLQ stoma with stenosis and small amount of fresh blood on a covering pad.   Musculoskeletal: Normal range of motion. She exhibits no edema.  Neurological: She is alert and oriented to person, place, and time.  Skin: Skin is warm and dry.  Psychiatric: Mood and affect normal.  Vitals reviewed.   Lab Results:  Recent Labs    12/04/16 0611 12/05/16 0502  WBC 5.4 5.6  HGB 7.1* 6.8*  HCT 22.1* 21.4*  PLT 226 336   BMET Recent Labs    12/04/16 0611 12/05/16 0502  NA 138 137  K 3.0* 3.8  CL 96* 97*  CO2 28 27  GLUCOSE 94 79  BUN 20 32*  CREATININE 6.24* 7.95*  CALCIUM 8.2* 8.7*   PT/INR Recent Labs    12/03/16 1818  LABPROT 14.5  INR 1.14   ABG No  results for input(s): PHART, HCO3 in the last 72 hours.  Invalid input(s): PCO2, PO2  Studies/Results: No results found.   Assessment: Hemorrhage from defunctionalized ileal conduit.  This could be related to her recent  infection but she needs more complete evaluation.  She needs a CT AP w/wo IV contrast and my need endoscopy of the conduit.   It would be best to hold Eliquis until the bleeding stops.    We discussed possible removal of the conduit but I don't think that is indicated urgently and can be readdress after further evaluation.  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      CC: Dr. Rhodia Albright 12/05/2016 [phone]

## 2016-12-05 NOTE — Progress Notes (Addendum)
[facility] for Infectious Disease   Reason for visit: Follow up on MRSA bacteremia  Interval History: repeat blood cultures ngtd. WBC wnl, no new issues.  Day 2 vancomycin  Physical Exam: Constitutional:  Vitals:   12/05/16 1045 12/05/16 1100  BP: 99/63 (!) 100/59  Pulse: 60 60  Resp: 16 16  Temp: (!) 97.5 F (36.4 C) (!) 97.4 F (36.3 C)  SpO2: 95% 92%   patient appears in NAD Respiratory: Normal respiratory effort; CTA B Cardiovascular: RRR GI: soft, nt, nd  Review of Systems: Constitutional: negative for fevers and chills Gastrointestinal: negative for diarrhea  Lab Results  Component Value Date   WBC 5.6 12/05/2016   HGB 6.8 (LL) 12/05/2016   HCT 21.4 (L) 12/05/2016   MCV 98.2 12/05/2016   PLT 336 12/05/2016    Lab Results  Component Value Date   CREATININE 7.95 (H) 12/05/2016   BUN 32 (H) 12/05/2016   NA 137 12/05/2016   K 3.8 12/05/2016   CL 97 (L) 12/05/2016   CO2 27 12/05/2016    Lab Results  Component Value Date   ALT 21 12/03/2016   AST 41 12/03/2016   ALKPHOS 137 (H) 12/03/2016     Microbiology: Recent Results (from the past 240 hour(s))  Culture, blood (Routine x 2)     Status: None (Preliminary result)   Collection Time: 12/03/16  6:20 PM  Result Value Ref Range Status   Specimen Description BLOOD LEFT HAND  Final   Special Requests   Final    Blood Culture adequate volume BOTTLES DRAWN AEROBIC AND ANAEROBIC   Culture NO GROWTH < 24 HOURS  Final   Report Status PENDING  Incomplete  Culture, blood (Routine x 2)     Status: None (Preliminary result)   Collection Time: 12/03/16  9:28 PM  Result Value Ref Range Status   Specimen Description BLOOD LEFT ANTECUBITAL  Final   Special Requests   Final    Blood Culture adequate volume BOTTLES DRAWN AEROBIC ONLY   Culture NO GROWTH < 24 HOURS  Final   Report Status PENDING  Incomplete  MRSA PCR Screening     Status: Abnormal   Collection Time: 12/04/16  1:51 AM  Result Value Ref Range  Status   MRSA by PCR POSITIVE (A) NEGATIVE Final    Comment:        The GeneXpert MRSA Assay (FDA approved for NASAL specimens only), is one component of a comprehensive MRSA colonization surveillance program. It is not intended to diagnose MRSA infection nor to guide or monitor treatment for MRSA infections. RESULT CALLED TO, READ BACK BY AND VERIFIED WITH: Fulton Reek RN [phone] 12/04/16 A BROWNING   C difficile quick scan w PCR reflex     Status: None   Collection Time: 12/04/16 10:45 AM  Result Value Ref Range Status   C Diff antigen NEGATIVE NEGATIVE Final   C Diff toxin NEGATIVE NEGATIVE Final   C Diff interpretation No C. difficile detected.  Final  Culture, Urine     Status: None   Collection Time: 12/04/16 10:46 AM  Result Value Ref Range Status   Specimen Description URINE, RANDOM  Final   Special Requests NONE  Final   Culture NO GROWTH  Final   Report Status 12/05/2016 FINAL  Final    Impression/Plan:  1. MRSA bacteremia - continue vancomycin after dialysis.  TTE pending.  2. Line - line holiday now, dialysis to resume tomorrow.    Dr.  Hatcher to follow up tomorrow.

## 2016-12-05 NOTE — Progress Notes (Signed)
[facility] for heparin while apixaban on hold  Indication: hx of DVT  Allergies  Allergen Reactions  . Erythromycin Anaphylaxis  . Ciprofloxacin     IV only-burning to her veins, can take PO  . Chlorhexidine Rash  . Other Rash    chloraprep    Patient Measurements: Heparin Dosing Weight: 88 kg  Vital Signs: Temp: 97.4 F (36.3 C) (12/02 1100) Temp Source: Oral (12/02 1100) BP: 100/59 (12/02 1100) Pulse Rate: 60 (12/02 1100)  Labs: Recent Labs    12/03/16 1818 12/04/16 0611 12/05/16 0502  HGB 8.0* 7.1* 6.8*  HCT 25.0* 22.1* 21.4*  PLT 314 226 336  LABPROT 14.5  --   --   INR 1.14  --   --   CREATININE 4.55* 6.24* 7.95*     Medical History: Past Medical History:  Diagnosis Date  . Anxiety   . Cervical cancer ([place])   . DVT (deep venous thrombosis) ([place])   . Renal disorder     Assessment: 51 yo F on apixaban PTA for hx of DVT admitted 12/1 with MRSA bacteremia and concern for bleeding/hemorrhage from defunctionalized ileal conduit. Transitioning to heparin gtt while w/u ongoing. Last dose of apixaban 12/2 am. Hgb 6.8, PLTc 336. Will use aPTT to guide therapy due to apixaban's lab interference with heparin levels.     Goal of Therapy:  Heparin level 0.3-0.7 units/ml aPTT 66-102 seconds Monitor platelets by anticoagulation protocol: Yes   Plan:  1. Begin heparin infusion at 1400 units/hr ar 22:00 this evening (12 hours after previous apixaban dose) 2. Obtain aPTT and heparin level 8 hours after starting heparin infusion  3. Await workup for bleeding and switch back to oral a/c when feasible   Vincenza Hews, PharmD, BCPS 12/05/2016, 11:23 AM

## 2016-12-05 NOTE — Progress Notes (Signed)
This nurse noticed a rash on both sides of pt's abdomen. Left side of abdomen is worse than the right. Pt states that it itches a little bit. Messaged NP on call to see if we can get an order for Benadryl.

## 2016-12-05 NOTE — Progress Notes (Signed)
CRITICAL VALUE ALERT  Critical Value:  HGB 6.8  Date & Time Notied:  0644  12/05/2016  Provider Notified: Kennon Holter  Orders Received/Actions taken: Received orders to transfuse 1 unit PRBC. On coming RN made aware. Type and Screen was needed, order was put in.

## 2016-12-05 NOTE — Progress Notes (Addendum)
[facility] Progress Note   Dialysis Orders: [facility] 4 hr EDW 117.5 2 K 2 Ca AVGG heparin 5000 venofer 50 mircera 100 q 2 weeks - last dose 11/27 - calcitriol 1- no Fe right upper AVGG Recent labs: hgb 7.7 dropping 11% sat, no ferritin, no K ipTH 461   Assessment/Plan: 1. MRSA bacteremia thought secondary to catheter sepsis (+ BC and cath exit drainage cultures 11/27).  Received 1.5 gm Vanc post HD 11/27 and another 1 gm 11/30. Vanc level 12/1 19. ID has seen TTE ordered + repeat BC no growth pending; pharmacy to dose Vanc  2. ESRD -  TTS  K 3> 3.8  - intake has been poor- losing weight- can defer next HD until Monday and then get back on schedule- hold heparin due to bloody abd drainage 3. Hypertension/volume  - not on BP meds - not an issue - losing weight 4. Anemia  - hgb 7.1 > 6.8  Fe def anemia - tsat 11% hgb 10 when she started at [facility] down to 9.2 then 7.7-Mircera last dose 100 11/27 - hgb likely low due to Fe and ESA deficit. Check FOBT. given bacteremia - Plan transfusion and then repletion of IV Fe  5. Metabolic bone disease -  Continue calcitriol, sensipar 90, calcium acetate  6. Nutrition - add multivitamin - hold on nepro due to diarrhea - not eating much alb 3.1 7. Hx DVT - on Eliquis- she is not sure why she is on this vs coumadin - had been on coumadin a long time ago. 8. Abdominal wound - old ileal conduit site with blood drainage; also source for bacteria-  Dr. Jeffie Pollock rec holding Eliquis and getting an abdominal CT 9. Chronic R nephrostomy tube- has appt with urology 12/5 to establish a relationship since newly moved to [place] 10. Hx ovarian vs cervical cancer s/p prior chemo/radiation but not surgery- "cancer free since 2000" no records available 11. Nausea - ? Etiology vague on onset - somewhat worrisome secondary to prior ovarian cancer history and unintentional weight loss; medical care has been somewhat disjointed. Abd CT to be done   Myriam Jacobson,  PA-C [facility] Beeper [phone] 12/05/2016,10:24 AM  LOS: 2 days   Pt seen, examined and agree w A/P as above.  Kelly Splinter MD [employer] pager [phone]   12/05/2016, 2:12 PM    Subjective:   For transfusion of 1 unit today. Abdominal bleeding better, but still nausea and diarrhea "when she eats" trying to nibble on food  Objective Vitals:   12/04/16 1816 12/04/16 2203 12/05/16 0548 12/05/16 0910  BP: 116/60 (!) 105/59 (!) 96/52 98/61  Pulse: (!) 57 60 (!) 57 (!) 55  Resp: 18 14 16 16   Temp: 98.4 F (36.9 C) 98.3 F (36.8 C) 97.9 F (36.6 C) (!) 97.5 F (36.4 C)  TempSrc: Oral Oral Oral Oral  SpO2: 97% 91% 95% 96%  Weight:      Height:       Physical Exam General: NAD  Heart: RRR Lungs: no rales Abdomen: soft nt obese less bleeding from mid abd wound Back: right nephrostomy tube Extremities: no sig edema Dialysis Access:  Right upper AVGG + bruit   Additional Objective Labs: Basic Metabolic Panel: Recent Labs  Lab 12/03/16 1818 12/04/16 0611 12/05/16 0502  NA 137 138 137  K 2.8* 3.0* 3.8  CL 93* 96* 97*  CO2 28 28 27   GLUCOSE 94 94 79  BUN 14 20  32*  CREATININE 4.55* 6.24* 7.95*  CALCIUM 8.5* 8.2* 8.7*   Liver Function Tests: Recent Labs  Lab 12/03/16 1818  AST 41  ALT 21  ALKPHOS 137*  BILITOT 0.5  PROT 8.1  ALBUMIN 3.1*   No results for input(s): LIPASE, AMYLASE in the last 168 hours. CBC: Recent Labs  Lab 12/03/16 1818 12/04/16 0611 12/05/16 0502  WBC 7.7 5.4 5.6  NEUTROABS 5.9  --   --   HGB 8.0* 7.1* 6.8*  HCT 25.0* 22.1* 21.4*  MCV 97.3 97.8 98.2  PLT 314 226 336   Blood Culture    Component Value Date/Time   SDES URINE, RANDOM 12/04/2016 1046   SPECREQUEST NONE 12/04/2016 1046   CULT NO GROWTH 12/04/2016 1046   REPTSTATUS 12/05/2016 FINAL 12/04/2016 1046    Cardiac Enzymes: No results for input(s): CKTOTAL, CKMB, CKMBINDEX, TROPONINI in the last 168 hours. CBG: No results for  input(s): GLUCAP in the last 168 hours. Iron Studies: No results for input(s): IRON, TIBC, TRANSFERRIN, FERRITIN in the last 72 hours. Lab Results  Component Value Date   INR 1.14 12/03/2016   Studies/Results: No results found. Medications: . sodium chloride    . [START ON 12/06/2016] vancomycin     . apixaban  5 mg Oral BID  . [START ON 12/06/2016] calcitRIOL  1 mcg Oral Q M,W,F-HD  . calcium acetate  1,334 mg Oral TID WC  . cinacalcet  90 mg Oral Q breakfast  . multivitamin  1 tablet Oral QHS  . mupirocin ointment  1 application Nasal BID  . sertraline  50 mg Oral Daily

## 2016-12-05 NOTE — Progress Notes (Addendum)
PROGRESS NOTE    Judy Kelley  TTS:[id number] DOB: 27-Feb-1965 DOA: 12/03/2016 PCP: System, Pcp Not In   Chief Complaint  Patient presents with  . Abnormal Lab    Brief Narrative:  HPI on 12/04/2016 by Dr. Gean Birchwood Brit Carbonell is a 51 y.o. female with history of ESRD on hemodialysis, history of DVT, previous history of ovarian cancer in remission who had just recently moved from [place] was found to have redness around her right subclavian dialysis catheter 3 days ago and had associated fever and chills.  Blood cultures were obtained at dialysis center and patient was empirically started on vancomycin.  Patient had received her second dose of vancomycin today at dialysis center.  Cultures came back as staph aureus today and was instructed to come to the ER.  Over the last 2 days patient also has been having poor appetite with nausea vomiting and diarrhea.  Denies any abdominal pain but has chronic pain around the right flank area where her nephrostomy tube is.  Patient's dialysis catheter was removed 2 days ago by vascular surgeon.  Assessment & Plan   MRSA bacteremia -Likely secondary to dialysis catheter (right subclavian) that was removed a few days ago prior to admission -Placed on vancomycin -Initial blood cultures obtained as an outpatient, per nephrology showed to be MRSA -blood cultures (12/03/2016) currently show no growth to date -Infectious disease consulted and appreciated -Echocardiogram pending  ESRD -Nephrology consulted and appreciated  Hypokalemia -Resolved with replacement -Likely secondary to GI losses, patient did have diarrhea prior to admission -Continue to monitor BMP -Magnesium 2.3  Diarrhea -Subsided prior to admission -C. 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 (back in early 2000s)- currently with bleeding from stoma -urology consulted and appreciated, recommended CT abd/pelvis w and w/o contrast  DVT Prophylaxis  Eliquis  Code Status: Full  Family Communication: None at bedside  Disposition Plan: admitted  Consultants Nephrology Infectious disease Urology  Procedures  None  Antibiotics   Anti-infectives (From admission, onward)   Start     Dose/Rate Route Frequency Ordered Stop   12/06/16 1200  vancomycin (VANCOCIN) IVPB 1000 mg/200 mL premix     1,000 mg 200 mL/hr over 60 Minutes Intravenous Every M-W-F (Hemodialysis) 12/04/16 1708        Subjective:   Jannet Askew seen and examined today.  Denies chest pain, shortness of breath, abdominal pain, current nausea, vomiting. Continues to have some bleeding from stoma, has slowed down.   Objective:   Vitals:   12/04/16 1816 12/04/16 2203 12/05/16 0548 12/05/16 0910  BP: 116/60 (!) 105/59 (!) 96/52 98/61  Pulse: (!) 57 60 (!) 57 (!) 55  Resp: 18 14 16 16   Temp: 98.4 F (36.9 C) 98.3 F (36.8 C) 97.9 F (36.6 C) (!) 97.5 F (36.4 C)  TempSrc: Oral Oral Oral Oral  SpO2: 97% 91% 95% 96%  Weight:      Height:        Intake/Output Summary (Last 24 hours) at 12/05/2016 1031 Last data filed at 12/05/2016 0923 Gross per 24 hour  Intake 1080 ml  Output 550 ml  Net 530 ml   [employer]  12/03/16 2255 12/04/16 0117  Weight: 116 kg (255 lb 11.7 oz) 116.2 kg (256 lb 2.8 oz)   Exam  General: Well developed, well nourished, NAD, appears stated age  HEENT: NCAT,  mucous membranes moist. Poor dentition  Cardiovascular: S1 S2 auscultated, no rubs, murmurs or gallops. Regular rate and rhythm.  Respiratory: Clear to auscultation bilaterally with equal chest  rise  Abdomen: Soft, nontender, nondistended, + bowel sounds, Left sided nephrostomy tube. Bleeding from stoma.  Extremities: warm dry without cyanosis clubbing or edema  Neuro: AAOx3, nonfocal  Psych: Normal affect and demeanor   Data Reviewed: I have personally reviewed following labs and imaging studies  CBC: Recent Labs  Lab 12/03/16 1818 12/04/16 0611 12/05/16 0502  WBC 7.7 5.4 5.6  NEUTROABS 5.9  --   --   HGB 8.0* 7.1* 6.8*  HCT 25.0* 22.1* 21.4*  MCV 97.3 97.8 98.2  PLT 314 226 160   Basic Metabolic Panel: Recent Labs  Lab 12/03/16 1818 12/04/16 0611 12/05/16 0502  NA 137 138 137  K 2.8* 3.0* 3.8  CL 93* 96* 97*  CO2 28 28 27   GLUCOSE 94 94 79  BUN 14 20 32*  CREATININE 4.55* 6.24* 7.95*  CALCIUM 8.5* 8.2* 8.7*  MG  --   --  2.3   GFR: Estimated Creatinine Clearance: 10.9 mL/min (A) (by C-G formula based on SCr of 7.95 mg/dL (H)). Liver Function Tests: Recent Labs  Lab 12/03/16 1818  AST 41  ALT 21  ALKPHOS 137*  BILITOT 0.5  PROT 8.1  ALBUMIN 3.1*   No results for input(s): LIPASE, AMYLASE in the last 168 hours. No results for input(s): AMMONIA in the last 168 hours. Coagulation Profile: Recent Labs  Lab 12/03/16 1818  INR 1.14   Cardiac Enzymes: No results for input(s): CKTOTAL, CKMB, CKMBINDEX, TROPONINI in the last 168 hours. BNP (last 3 results) No results for input(s): PROBNP in the last 8760 hours. HbA1C: No results for input(s): HGBA1C in the last 72 hours. CBG: No results for input(s): GLUCAP in the last 168 hours. Lipid Profile: No results for input(s): CHOL, HDL, LDLCALC, TRIG, CHOLHDL, LDLDIRECT in the last 72 hours. Thyroid Function Tests: No results for input(s): TSH, T4TOTAL, FREET4, T3FREE, THYROIDAB in the last 72 hours. Anemia Panel: No results for input(s): VITAMINB12, FOLATE, FERRITIN, TIBC, IRON, RETICCTPCT in the last 72 hours. Urine analysis:    Component Value Date/Time   COLORURINE YELLOW 12/03/2016 1955    APPEARANCEUR HAZY (A) 12/03/2016 1955   LABSPEC 1.010 12/03/2016 1955   PHURINE 9.0 (H) 12/03/2016 1955   GLUCOSEU NEGATIVE 12/03/2016 1955   HGBUR SMALL (A) 12/03/2016 [place] NEGATIVE 12/03/2016 [place] NEGATIVE 12/03/2016 1955   PROTEINUR 100 (A) 12/03/2016 1955   NITRITE NEGATIVE 12/03/2016 1955   LEUKOCYTESUR LARGE (A) 12/03/2016 1955   Sepsis Labs: @LABRCNTIP (procalcitonin:4,lacticidven:4)  ) Recent Results (from the past 240 hour(s))  Culture, blood (Routine x 2)     Status: None (Preliminary result)   Collection Time: 12/03/16  6:20 PM  Result Value Ref Range Status   Specimen Description BLOOD LEFT HAND  Final   Special Requests   Final    Blood Culture adequate volume BOTTLES DRAWN AEROBIC AND ANAEROBIC   Culture NO GROWTH < 24 HOURS  Final   Report Status PENDING  Incomplete  Culture, blood (Routine x 2)     Status: None (Preliminary result)   Collection Time: 12/03/16  9:28 PM  Result Value Ref Range  Status   Specimen Description BLOOD LEFT ANTECUBITAL  Final   Special Requests   Final    Blood Culture adequate volume BOTTLES DRAWN AEROBIC ONLY   Culture NO GROWTH < 24 HOURS  Final   Report Status PENDING  Incomplete  MRSA PCR Screening     Status: Abnormal   Collection Time: 12/04/16  1:51 AM  Result Value Ref Range Status   MRSA by PCR POSITIVE (A) NEGATIVE Final    Comment:        The GeneXpert MRSA Assay (FDA approved for NASAL specimens only), is one component of a comprehensive MRSA colonization surveillance program. It is not intended to diagnose MRSA infection nor to guide or monitor treatment for MRSA infections. RESULT CALLED TO, READ BACK BY AND VERIFIED WITH: Fulton Reek RN [phone] 12/04/16 A BROWNING   C difficile quick scan w PCR reflex     Status: None   Collection Time: 12/04/16 10:45 AM  Result Value Ref Range Status   C Diff antigen NEGATIVE NEGATIVE Final   C Diff toxin NEGATIVE NEGATIVE Final   C Diff interpretation  No C. difficile detected.  Final  Culture, Urine     Status: None   Collection Time: 12/04/16 10:46 AM  Result Value Ref Range Status   Specimen Description URINE, RANDOM  Final   Special Requests NONE  Final   Culture NO GROWTH  Final   Report Status 12/05/2016 FINAL  Final      Radiology Studies: No results found.   Scheduled Meds: . apixaban  5 mg Oral BID  . [START ON 12/06/2016] calcitRIOL  1 mcg Oral Q M,W,F-HD  . calcium acetate  1,334 mg Oral TID WC  . cinacalcet  90 mg Oral Q breakfast  . multivitamin  1 tablet Oral QHS  . mupirocin ointment  1 application Nasal BID  . sertraline  50 mg Oral Daily   Continuous Infusions: . sodium chloride    . [START ON 12/06/2016] vancomycin       LOS: 2 days   Time Spent in minutes   45 minutes  Kortney Potvin D.O. on 12/05/2016 at 10:31 AM  Between 7am to 7pm - Pager - [phone]  After 7pm go to [email or web address] - password [email or web address]  And look for the night coverage person covering for me after hours  [facility] Office  [phone]

## 2016-12-06 ENCOUNTER — Inpatient Hospital Stay (HOSPITAL_COMMUNITY): Payer: Medicare Other

## 2016-12-06 DIAGNOSIS — R7881 Bacteremia: Secondary | ICD-10-CM

## 2016-12-06 DIAGNOSIS — Z86718 Personal history of other venous thrombosis and embolism: Secondary | ICD-10-CM

## 2016-12-06 DIAGNOSIS — Z8543 Personal history of malignant neoplasm of ovary: Secondary | ICD-10-CM

## 2016-12-06 DIAGNOSIS — Y712 Prosthetic and other implants, materials and accessory cardiovascular devices associated with adverse incidents: Secondary | ICD-10-CM

## 2016-12-06 DIAGNOSIS — N186 End stage renal disease: Secondary | ICD-10-CM

## 2016-12-06 DIAGNOSIS — T827XXD Infection and inflammatory reaction due to other cardiac and vascular devices, implants and grafts, subsequent encounter: Secondary | ICD-10-CM

## 2016-12-06 LAB — CBC
HEMATOCRIT: 23.9 % — AB (ref 36.0–46.0)
HEMOGLOBIN: 7.6 g/dL — AB (ref 12.0–15.0)
MCH: 31 pg (ref 26.0–34.0)
MCHC: 31.8 g/dL (ref 30.0–36.0)
MCV: 97.6 fL (ref 78.0–100.0)
Platelets: 290 10*3/uL (ref 150–400)
RBC: 2.45 MIL/uL — AB (ref 3.87–5.11)
RDW: 14.6 % (ref 11.5–15.5)
WBC: 5.2 10*3/uL (ref 4.0–10.5)

## 2016-12-06 LAB — BASIC METABOLIC PANEL
ANION GAP: 13 (ref 5–15)
BUN: 41 mg/dL — ABNORMAL HIGH (ref 6–20)
CALCIUM: 8.2 mg/dL — AB (ref 8.9–10.3)
CHLORIDE: 97 mmol/L — AB (ref 101–111)
CO2: 26 mmol/L (ref 22–32)
Creatinine, Ser: 9.58 mg/dL — ABNORMAL HIGH (ref 0.44–1.00)
GFR calc non Af Amer: 4 mL/min — ABNORMAL LOW (ref >60)
GFR, EST AFRICAN AMERICAN: 5 mL/min — AB (ref >60)
GLUCOSE: 82 mg/dL (ref 65–99)
POTASSIUM: 3.5 mmol/L (ref 3.5–5.1)
Sodium: 136 mmol/L (ref 135–145)

## 2016-12-06 LAB — APTT
APTT: 39 s — AB (ref 24–36)
aPTT: 40 seconds — ABNORMAL HIGH (ref 24–36)

## 2016-12-06 LAB — BPAM RBC
BLOOD PRODUCT EXPIRATION DATE: 201812222359
ISSUE DATE / TIME: 201812021043
UNIT TYPE AND RH: 6200

## 2016-12-06 LAB — TYPE AND SCREEN
ABO/RH(D): A POS
Antibody Screen: NEGATIVE
Unit division: 0

## 2016-12-06 LAB — HEPATITIS B SURFACE ANTIGEN: HEP B S AG: NEGATIVE

## 2016-12-06 LAB — ECHOCARDIOGRAM COMPLETE
Height: 67 in
Weight: 4098.79 oz

## 2016-12-06 LAB — HEPARIN LEVEL (UNFRACTIONATED)

## 2016-12-06 MED ORDER — SODIUM CHLORIDE 0.9 % IV SOLN: 100.0000 mL | INTRAVENOUS | Status: DC | PRN

## 2016-12-06 MED ORDER — HEPARIN SODIUM (PORCINE) 1000 UNIT/ML DIALYSIS: 20.0000 [IU]/kg | INTRAMUSCULAR | Status: DC | PRN

## 2016-12-06 MED ORDER — LIDOCAINE-PRILOCAINE 2.5-2.5 % EX CREA: 1.0000 "application " | TOPICAL_CREAM | CUTANEOUS | Status: DC | PRN

## 2016-12-06 MED ORDER — CALCITRIOL 0.5 MCG PO CAPS
ORAL_CAPSULE | ORAL | Status: AC
Start: 2016-12-06 — End: 2016-12-07
  Filled 2016-12-06: qty 2

## 2016-12-06 MED ORDER — ALTEPLASE 2 MG IJ SOLR: 2.0000 mg | Freq: Once | INTRAMUSCULAR | Status: DC | PRN

## 2016-12-06 MED ORDER — LIDOCAINE HCL (PF) 1 % IJ SOLN: 5.0000 mL | INTRAMUSCULAR | Status: DC | PRN

## 2016-12-06 MED ORDER — PENTAFLUOROPROP-TETRAFLUOROETH EX AERO: 1.0000 "application " | INHALATION_SPRAY | CUTANEOUS | Status: DC | PRN

## 2016-12-06 MED ORDER — VANCOMYCIN HCL IN DEXTROSE 1-5 GM/200ML-% IV SOLN
INTRAVENOUS | Status: AC
  Filled 2016-12-06: qty 200

## 2016-12-06 MED ORDER — NYSTATIN 100000 UNIT/GM EX POWD
Freq: Three times a day (TID) | CUTANEOUS | Status: DC
  Administered 2016-12-06 – 2016-12-13 (×16): via TOPICAL
  Filled 2016-12-06: qty 15

## 2016-12-06 MED ORDER — HEPARIN SODIUM (PORCINE) 1000 UNIT/ML DIALYSIS: 1000.0000 [IU] | INTRAMUSCULAR | Status: DC | PRN

## 2016-12-06 NOTE — Progress Notes (Signed)
Assessment and Plan: Bleeding from stoma:  Bleeding has improved.  CT shows an atretic conduit without obvious mass, but the final read wasn't back.   She has a large parastomal hernia.   She will need to have the conduit scoped at some point as an outpatient.    Subjective: Judy Kelley reports reduced bleeding from the stoma and has no associated signs or symptoms.   ROS:  Review of Systems  Constitutional: Negative for fever.  Gastrointestinal: Negative for abdominal pain.    Anti-infectives: Anti-infectives (From admission, onward)   Start     Dose/Rate Route Frequency Ordered Stop   12/06/16 1200  vancomycin (VANCOCIN) IVPB 1000 mg/200 mL premix     1,000 mg 200 mL/hr over 60 Minutes Intravenous Every M-W-F (Hemodialysis) 12/04/16 1708        Current Facility-Administered Medications  Medication Dose Route Frequency Provider Last Rate Last Dose  . acetaminophen (TYLENOL) tablet 650 mg  650 mg Oral Q6H PRN Rise Patience, MD       Or  . acetaminophen (TYLENOL) suppository 650 mg  650 mg Rectal Q6H PRN Rise Patience, MD      . calcitRIOL (ROCALTROL) capsule 1 mcg  1 mcg Oral Q M,W,F-HD Mikhail, Nambe, DO      . calcium acetate (PHOSLO) capsule 1,334 mg  1,334 mg Oral TID WC Rise Patience, MD   1,334 mg at 12/05/16 1737  . calcium acetate (PHOSLO) capsule 667 mg  667 mg Oral PRN Rise Patience, MD      . cinacalcet [facility]) tablet 90 mg  90 mg Oral Q breakfast Rise Patience, MD   90 mg at 12/05/16 0940  . diphenhydrAMINE (BENADRYL) capsule 25 mg  25 mg Oral Q6H PRN Lovey Newcomer T, NP   25 mg at 12/06/16 0012  . heparin ADULT infusion 100 units/mL (25000 units/247mL sodium chloride 0.45%)  1,400 Units/hr Intravenous Continuous Cristal Ford, DO 14 mL/hr at 12/05/16 2237 1,400 Units/hr at 12/05/16 2237  . multivitamin (RENA-VIT) tablet 1 tablet  1 tablet Oral QHS Alric Seton, PA-C   1 tablet at 12/05/16 2237  . mupirocin ointment  (BACTROBAN) 2 % 1 application  1 application Nasal BID Rise Patience, MD   1 application at 16/10/96 2237  . ondansetron (ZOFRAN) tablet 4 mg  4 mg Oral Q6H PRN Rise Patience, MD   4 mg at 12/05/16 0454   Or  . ondansetron (ZOFRAN) injection 4 mg  4 mg Intravenous Q6H PRN Rise Patience, MD   4 mg at 12/04/16 1322  . oxyCODONE (Oxy IR/ROXICODONE) immediate release tablet 5 mg  5 mg Oral Q4H PRN Rise Patience, MD   5 mg at 12/06/16 0012  . promethazine (PHENERGAN) tablet 25 mg  25 mg Oral Q6H PRN Rise Patience, MD      . sertraline (ZOLOFT) tablet 50 mg  50 mg Oral Daily Rise Patience, MD   50 mg at 12/05/16 0941  . vancomycin (VANCOCIN) IVPB 1000 mg/200 mL premix  1,000 mg Intravenous Q M,W,F-HD Mikhail, Velta Addison, DO         Objective: Vital signs in last 24 hours: Temp:  [97.4 F (36.3 C)-98.1 F (36.7 C)] 97.4 F (36.3 C) (12/03 0515) Pulse Rate:  [51-60] 55 (12/03 0515) Resp:  [16-18] 18 (12/03 0515) BP: (98-130)/(59-66) 102/64 (12/03 0515) SpO2:  [92 %-100 %] 98 % (12/03 0515)  Intake/Output from previous day: 12/02 0701 - 12/03 0700 In:  1185 [P.O.:850; Blood:335] Out: 250 [Urine:250] Intake/Output this shift: Total I/O In: 120 [P.O.:120] Out: -    Physical Exam  Lab Results:  Recent Labs    12/05/16 0502 12/06/16 0612  WBC 5.6 5.2  HGB 6.8* 7.6*  HCT 21.4* 23.9*  PLT 336 290   BMET Recent Labs    12/04/16 0611 12/05/16 0502  NA 138 137  K 3.0* 3.8  CL 96* 97*  CO2 28 27  GLUCOSE 94 79  BUN 20 32*  CREATININE 6.24* 7.95*  CALCIUM 8.2* 8.7*   PT/INR Recent Labs    12/03/16 1818  LABPROT 14.5  INR 1.14   ABG No results for input(s): PHART, HCO3 in the last 72 hours.  Invalid input(s): PCO2, PO2  Studies/Results: No results found.   CT films reviewed.       LOS: 3 days    Irine Seal 12/06/2016 [phone] ID: Jannet Askew, female   DOB: Aug 11, 1965, 51 y.o.   MRN: [id number]

## 2016-12-06 NOTE — Progress Notes (Signed)
Subjective:  Just finished TTE , Feel better than past few days Not back 100% , for hd today then tomor  Back on schedule   Objective Vital signs in last 24 hours: Vitals:   12/05/16 1711 12/05/16 2212 12/06/16 0515 12/06/16 1002  BP: 113/60 111/62 102/64 (!) 107/53  Pulse: (!) 51 (!) 57 (!) 55 (!) 50  Resp: 16 17 18 18   Temp: (!) 97.5 F (36.4 C) 97.9 F (36.6 C) (!) 97.4 F (36.3 C) 97.6 F (36.4 C)  TempSrc: Oral Oral Oral Oral  SpO2: 94% 100% 98% 95%  Weight:      Height:       Weight change:   Physical Exam: General: alert , pleasant Obese , wf  ,OX3  Heart: RRR ,no m,r,g Lungs: CTA Abdomen: Obese , Bs +, soft < NT, ND , Left sided Nephrostomy tube dried blood at site Extremities: Dialysis Access: Pos bruit RUA AVGG  Dialysis Orders: [facility] [place] 4 hr EDW 117.5 2 K 2 Ca AVGG heparin 5000 venofer 50 mircera 100 q 2 weeks - last dose 11/27 - calcitriol 1- no Fe right upper AVGG Recent labs: hgb 7.7 dropping 11% sat, no ferritin, no K ipTH 461   Problem/Plan: 1. MRSA bacteremia thought secondary to catheter sepsis (+ BC and cath exit drainage cultures 11/27). R IJ Perm cath removed 11/30/1/8  / On IV Vanco. ID seeing has  TTE ordered ( just done)+ repeat BC no growth pending. 2. ESRD- TTS K 3> 3.8>3.5   - intake has been poor- losing weight- deferred SatHD her  Last  HD until  Today and then back on schedule- hold HD  heparin due to bloody abd drainage 3. Hypertension/volume- BP 102/64 this am  not on BP meds  - losing weight/ last wt 116.2  On 12/04/16  4. Anemia- hgb 7.1 > 6.8 > yester. 1 u PRBCS 7.6 this am / Fe def anemia - tsat 11% OP  hgb 10 when she started at [place] last dose 100 11/27 - hgb likely low due to Fe and ESA deficit. Check FOBT./ hold fe load given bacteremia -  5. Metabolic bone disease- Continue calcitriol, sensipar 90, calcium acetate 6. Nutrition- alb 3.1 old on nepro due to diarrhea - not eating much  But asking for tray now  7. Hx DVT  - on Eliquis on admit / had been on coumadin in past Abdominal wound- old ileal conduit site with blood drainage; also source for bacteria-  Dr. Jeffie Pollock seeing rec holding Eliquis/  abdominal CT 8. Chronic R nephrostomy tube- has appt with urology 12/5 to establish a relationship since newly moved to [place] 9. Hx ovarian vs cervical cancers/p prior chemo/radiation( In [place]. ) but not surgery- "cancer free since 2000" no records available   Ernest Haber, PA-C [facility] Beeper [phone] 12/06/2016,11:45 AM  LOS: 3 days   Labs: Basic Metabolic Panel: Recent Labs  Lab 12/04/16 0611 12/05/16 0502 12/06/16 0612  NA 138 137 136  K 3.0* 3.8 3.5  CL 96* 97* 97*  CO2 28 27 26   GLUCOSE 94 79 82  BUN 20 32* 41*  CREATININE 6.24* 7.95* 9.58*  CALCIUM 8.2* 8.7* 8.2*   Liver Function Tests: Recent Labs  Lab 12/03/16 1818  AST 41  ALT 21  ALKPHOS 137*  BILITOT 0.5  PROT 8.1  ALBUMIN 3.1*   CBC: Recent Labs  Lab 12/03/16 1818 12/04/16 0611 12/05/16 0502 12/06/16 0612  WBC 7.7 5.4 5.6 5.2  NEUTROABS 5.9  --   --   --   HGB 8.0* 7.1* 6.8* 7.6*  HCT 25.0* 22.1* 21.4* 23.9*  MCV 97.3 97.8 98.2 97.6  PLT 314 226 336 290    Medications: . heparin 1,550 Units/hr (12/06/16 1000)  . vancomycin     . calcitRIOL  1 mcg Oral Q M,W,F-HD  . calcium acetate  1,334 mg Oral TID WC  . cinacalcet  90 mg Oral Q breakfast  . multivitamin  1 tablet Oral QHS  . mupirocin ointment  1 application Nasal BID  . nystatin   Topical TID  . sertraline  50 mg Oral Daily

## 2016-12-06 NOTE — Progress Notes (Signed)
  Echocardiogram 2D Echocardiogram has been performed.  Judy Kelley 12/06/2016, 11:49 AM

## 2016-12-06 NOTE — Progress Notes (Signed)
PROGRESS NOTE    Judy Kelley  XLK:[id number] DOB: 19-Sep-1965 DOA: 12/03/2016 PCP: System, Pcp Not In   Chief Complaint  Patient presents with  . Abnormal Lab    Brief Narrative:  HPI on 12/04/2016 by Dr. Gean Birchwood Judy Kelley is a 51 y.o. female with history of ESRD on hemodialysis, history of DVT, previous history of ovarian cancer in remission who had just recently moved from [place] was found to have redness around her right subclavian dialysis catheter 3 days ago and had associated fever and chills.  Blood cultures were obtained at dialysis center and patient was empirically started on vancomycin.  Patient had received her second dose of vancomycin today at dialysis center.  Cultures came back as staph aureus today and was instructed to come to the ER.  Over the last 2 days patient also has been having poor appetite with nausea vomiting and diarrhea.  Denies any abdominal pain but has chronic pain around the right flank area where her nephrostomy tube is.  Patient's dialysis catheter was removed 2 days ago by vascular surgeon.  Assessment & Plan   MRSA bacteremia -Likely secondary to dialysis catheter (right subclavian) that was removed a few days ago prior to admission -Placed on vancomycin -Initial blood cultures obtained as an outpatient, per nephrology showed to be MRSA -blood cultures (12/03/2016) currently show no growth to date -Infectious disease consulted and appreciated -Echocardiogram pending  ESRD -Nephrology consulted and appreciated -dialyzes TTS  Hypokalemia -Resolved with replacement -Likely secondary to GI losses, patient did have diarrhea prior to admission -Continue to monitor BMP -Magnesium 2.3  Diarrhea -Subsided prior to admission -C. 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 (back in early 2000s)- currently with bleeding from stoma -urology consulted and appreciated, recommended CT abd/pelvis w and w/o contrast -CT showed right nephrostomy in place without hydronephrosis  Dermatitis/rash -appears to be more fungal, only on flanks and B/L -will add on nystatin powder and continue to monitor   Morbid obesity  -BMI 40 -will need to discuss weight management with PCP on discharge  DVT Prophylaxis  Eliquis --> heparin  Code Status: Full  Family Communication: None at bedside  Disposition Plan: admitted  Consultants Nephrology Infectious disease Urology  Procedures  None  Antibiotics   Anti-infectives (From admission, onward)   Start     Dose/Rate Route Frequency Ordered Stop   12/06/16 1200  vancomycin (VANCOCIN) IVPB 1000 mg/200 mL premix     1,000 mg 200 mL/hr over 60 Minutes Intravenous Every M-W-F (Hemodialysis) 12/04/16 1708        Subjective:   Judy Kelley seen and examined today.  Denies chest pain, shortness of breath, abdominal pain, current nausea, vomiting. Feels bleeding from her stoma has slowed down.  Objective:   Vitals:   12/05/16 1711 12/05/16 2212 12/06/16 0515 12/06/16 1002  BP: 113/60 111/62 102/64 (!) 107/53  Pulse: (!) 51 (!) 57 (!) 55 (!) 50  Resp: 16 17 18 18   Temp: (!) 97.5 F (36.4 C) 97.9 F (  36.6 C) (!) 97.4 F (36.3 C) 97.6 F (36.4 C)  TempSrc: Oral Oral Oral Oral  SpO2: 94% 100% 98% 95%  Weight:      Height:        Intake/Output Summary (Last 24 hours) at 12/06/2016 1340 Last data filed at 12/06/2016 1149 Gross per 24  hour  Intake 1185 ml  Output 250 ml  Net 935 ml   Filed Weights   12/03/16 2255 12/04/16 0117  Weight: 116 kg (255 lb 11.7 oz) 116.2 kg (256 lb 2.8 oz)   Exam  General: Well developed, well nourished, NAD, appears stated age  HEENT: NCAT,  mucous membranes moist. Poor dentition  Cardiovascular: S1 S2 auscultated, RRR, no murmurs  Respiratory: Clear to auscultation bilaterally with equal chest rise  Abdomen: Soft, obese, nontender, nondistended, + bowel sounds, Left sided nephrostomy tube. Bleeding from stoma- slowed  Extremities: warm dry without cyanosis clubbing or edema  Neuro: AAOx3, nonfocal  Psych: Normal affect and demeanor, pleasant    Data Reviewed: I have personally reviewed following labs and imaging studies  CBC: Recent Labs  Lab 12/03/16 1818 12/04/16 0611 12/05/16 0502 12/06/16 0612  WBC 7.7 5.4 5.6 5.2  NEUTROABS 5.9  --   --   --   HGB 8.0* 7.1* 6.8* 7.6*  HCT 25.0* 22.1* 21.4* 23.9*  MCV 97.3 97.8 98.2 97.6  PLT 314 226 336 671   Basic Metabolic Panel: Recent Labs  Lab 12/03/16 1818 12/04/16 0611 12/05/16 0502 12/06/16 0612  NA 137 138 137 136  K 2.8* 3.0* 3.8 3.5  CL 93* 96* 97* 97*  CO2 28 28 27 26   GLUCOSE 94 94 79 82  BUN 14 20 32* 41*  CREATININE 4.55* 6.24* 7.95* 9.58*  CALCIUM 8.5* 8.2* 8.7* 8.2*  MG  --   --  2.3  --    GFR: Estimated Creatinine Clearance: 9 mL/min (A) (by C-G formula based on SCr of 9.58 mg/dL (H)). Liver Function Tests: Recent Labs  Lab 12/03/16 1818  AST 41  ALT 21  ALKPHOS 137*  BILITOT 0.5  PROT 8.1  ALBUMIN 3.1*   No results for input(s): LIPASE, AMYLASE in the last 168 hours. No results for input(s): AMMONIA in the last 168 hours. Coagulation Profile: Recent Labs  Lab 12/03/16 1818  INR 1.14   Cardiac Enzymes: No results for input(s): CKTOTAL, CKMB, CKMBINDEX, TROPONINI in the last 168 hours. BNP (last 3 results) No results for input(s): PROBNP in the last 8760 hours. HbA1C: No  results for input(s): HGBA1C in the last 72 hours. CBG: No results for input(s): GLUCAP in the last 168 hours. Lipid Profile: No results for input(s): CHOL, HDL, LDLCALC, TRIG, CHOLHDL, LDLDIRECT in the last 72 hours. Thyroid Function Tests: No results for input(s): TSH, T4TOTAL, FREET4, T3FREE, THYROIDAB in the last 72 hours. Anemia Panel: No results for input(s): VITAMINB12, FOLATE, FERRITIN, TIBC, IRON, RETICCTPCT in the last 72 hours. Urine analysis:    Component Value Date/Time   COLORURINE YELLOW 12/03/2016 1955   APPEARANCEUR HAZY (A) 12/03/2016 1955   LABSPEC 1.010 12/03/2016 1955   PHURINE 9.0 (H) 12/03/2016 1955   GLUCOSEU NEGATIVE 12/03/2016 1955   HGBUR SMALL (A) 12/03/2016 [place] NEGATIVE 12/03/2016 [place] NEGATIVE 12/03/2016 1955   PROTEINUR 100 (A) 12/03/2016 1955   NITRITE NEGATIVE 12/03/2016 1955   LEUKOCYTESUR LARGE (A) 12/03/2016 1955   Sepsis Labs: @LABRCNTIP (procalcitonin:4,lacticidven:4)  ) Recent Results (from the past 240 hour(s))  Culture, blood (Routine x  2)     Status: None (Preliminary result)   Collection Time: 12/03/16  6:20 PM  Result Value Ref Range Status   Specimen Description BLOOD LEFT HAND  Final   Special Requests   Final    Blood Culture adequate volume BOTTLES DRAWN AEROBIC AND ANAEROBIC   Culture NO GROWTH 3 DAYS  Final   Report Status PENDING  Incomplete  Culture, blood (Routine x 2)     Status: None (Preliminary result)   Collection Time: 12/03/16  9:28 PM  Result Value Ref Range Status   Specimen Description BLOOD LEFT ANTECUBITAL  Final   Special Requests   Final    Blood Culture adequate volume BOTTLES DRAWN AEROBIC ONLY   Culture NO GROWTH 3 DAYS  Final   Report Status PENDING  Incomplete  MRSA PCR Screening     Status: Abnormal   Collection Time: 12/04/16  1:51 AM  Result Value Ref Range Status   MRSA by PCR POSITIVE (A) NEGATIVE Final    Comment:        The GeneXpert MRSA Assay (FDA approved for  NASAL specimens only), is one component of a comprehensive MRSA colonization surveillance program. It is not intended to diagnose MRSA infection nor to guide or monitor treatment for MRSA infections. RESULT CALLED TO, READ BACK BY AND VERIFIED WITH: Fulton Reek RN [phone] 12/04/16 A BROWNING   C difficile quick scan w PCR reflex     Status: None   Collection Time: 12/04/16 10:45 AM  Result Value Ref Range Status   C Diff antigen NEGATIVE NEGATIVE Final   C Diff toxin NEGATIVE NEGATIVE Final   C Diff interpretation No C. difficile detected.  Final  Culture, Urine     Status: None   Collection Time: 12/04/16 10:46 AM  Result Value Ref Range Status   Specimen Description URINE, RANDOM  Final   Special Requests NONE  Final   Culture NO GROWTH  Final   Report Status 12/05/2016 FINAL  Final      Radiology Studies: Ct Abdomen Pelvis W Wo Contrast  Result Date: 12/06/2016 CLINICAL DATA:  Bladder disorder. EXAM: CT ABDOMEN AND PELVIS WITHOUT AND WITH CONTRAST TECHNIQUE: Multidetector CT imaging of the abdomen and pelvis was performed following the standard protocol before and following the bolus administration of intravenous contrast. CONTRAST:  157mL ISOVUE-300 IOPAMIDOL (ISOVUE-300) INJECTION 61% COMPARISON:  None. FINDINGS: Lower chest: Lung bases show dependent atelectasis bilaterally. Heart is mildly enlarged. No pericardial or pleural effusion. Distal periesophageal lymph nodes are subcentimeter in short axis size. Distal esophagus is grossly unremarkable. Hepatobiliary: Subcentimeter low-attenuation lesion in the posterior right hepatic lobe is too small to characterize. Liver and gallbladder are otherwise unremarkable. No biliary ductal dilatation. Pancreas: Negative. Spleen: Negative. Adrenals/Urinary Tract: Adrenal glands are unremarkable. No urinary stones. Percutaneous nephrostomy tube in the right kidney which appears somewhat atrophic and scarred. No right hydronephrosis. No residual left  renal parenchyma visualized. Residual dilated collecting system in the left renal fossa measures 5.1 x 6.3 cm. Minimal excretion of contrast from the right kidney. Patient reportedly has an ileal conduit. Stomach/Bowel: Stomach is unremarkable. Postoperative changes in the distal small bowel. Large midline ventral abdominal wall hernia contains unobstructed colon. Colon is otherwise unremarkable. Vascular/Lymphatic: Atherosclerotic calcification of the arterial vasculature without abdominal aortic aneurysm. No pathologically enlarged lymph nodes. Reproductive: Uterus is visualized.  No adnexal mass. Other: Presacral thickening. No pleural fluid. There are 2 midline ventral abdominal wall hernias, a smaller and more superiorly located hernia  measures 4.1 x 5.8 cm and contains only fat, and a larger hernia that measures 8.4 x 17.8 cm and contains unobstructed colon. Musculoskeletal: No worrisome lytic or sclerotic lesions. Degenerative changes in the spine. IMPRESSION: 1. Right nephrostomy in place without hydronephrosis. No visible left renal parenchyma with a residual dilated collecting system in the left renal fossa. Patient reportedly has an ileal conduit. 2. Midline ventral abdominal wall hernias, the largest of which contains unobstructed colon. 3.  Aortic atherosclerosis (ICD10-170.0). Electronically Signed   By: Lorin Picket M.D.   On: 12/06/2016 08:21     Scheduled Meds: . calcitRIOL  1 mcg Oral Q M,W,F-HD  . calcium acetate  1,334 mg Oral TID WC  . cinacalcet  90 mg Oral Q breakfast  . multivitamin  1 tablet Oral QHS  . mupirocin ointment  1 application Nasal BID  . nystatin   Topical TID  . sertraline  50 mg Oral Daily   Continuous Infusions: . heparin 1,550 Units/hr (12/06/16 1000)  . vancomycin       LOS: 3 days   Time Spent in minutes   45 minutes  Caitland Porchia D.O. on 12/06/2016 at 1:40 PM  Between 7am to 7pm - Pager - [phone]  After 7pm go to [email or web address] - password  [email or web address]  And look for the night coverage person covering for me after hours  [facility] Office  [phone]

## 2016-12-06 NOTE — Procedures (Signed)
Patient seen and examined on Hemodialysis. QB 300 mL/ min LUE AVG, UF goal 1.5L.  Tolerating treatment well.  Treatment adjusted as needed.  Madelon Lips MD [facility] pgr [phone] 4:46 PM

## 2016-12-06 NOTE — Consult Note (Addendum)
[place] Nurse wound consult note Reason for Consult: Consult requested for previous urostomy site to right abd.  Refer to consult note from urology today for assessment and plan of care. Wound type: Previous stoma site is a full thickness wound; approx 1X1X.1cm, pink and moist.  Pt had mod amt bleeding this weekend until earlier this am.  Currently, there is a small amt old dried blood on the dressing since blood thinners were stopped. Dressing procedure/placement/frequency: Continue present plan of care with gauze dressing and ABD pad to protect from further injury and promote healing. Pt plans to follow-up with urology for a scope procedure after discharge, according to the EMR. Please re-consult if further assistance is needed.  Thank-you,  Julien Girt MSN, RN, CWOCN, CWCN-AP, CNS

## 2016-12-06 NOTE — Progress Notes (Signed)
[place] for heparin while apixaban on hold  Indication: hx of DVT  Allergies  Allergen Reactions  . Erythromycin Anaphylaxis  . Ciprofloxacin     IV only-burning to her veins, can take PO  . Chlorhexidine Rash  . Other Rash    chloraprep    Patient Measurements: Heparin Dosing Weight: 88 kg  Vital Signs: Temp: 97.4 F (36.3 C) (12/03 0515) Temp Source: Oral (12/03 0515) BP: 102/64 (12/03 0515) Pulse Rate: 55 (12/03 0515)  Labs: Recent Labs    12/03/16 1818 12/04/16 0611 12/05/16 0502 12/05/16 1749 12/06/16 0612  HGB 8.0* 7.1* 6.8*  --  7.6*  HCT 25.0* 22.1* 21.4*  --  23.9*  PLT 314 226 336  --  290  APTT  --   --   --  40* 40*  LABPROT 14.5  --   --   --   --   INR 1.14  --   --   --   --   HEPARINUNFRC  --   --   --  >2.20* >2.20*  CREATININE 4.55* 6.24* 7.95*  --  9.58*    Assessment: 51 yo F on apixaban PTA for hx of DVT admitted 12/1 with MRSA bacteremia and concern for bleeding/hemorrhage from defunctionalized ileal conduit. Transitioning to heparin gtt while w/u ongoing. Last dose of apixaban 12/2AM.  Heparin level this morning >2.2 (reflective of Eliquis) and aPTT low at 40 seconds. No bleeding/oozing noticed by RN.  Hgb 7.6, plts 290.  Goal of Therapy:  Heparin level 0.3-0.7 units/ml aPTT 66-102 seconds Monitor platelets by anticoagulation protocol: Yes   Plan:  Increase heparin to 1550 units/hr APTT in 8h after increase Daily heparin level and aPTT until correlating; daily CBC Follow  workup for bleeding and switch back to oral agent when feasible   Kaysea Raya D. Detrick Dani, PharmD, BCPS Clinical Pharmacist Clinical Phone for 12/06/2016 until 3:30pm: [phone] If after 3:30pm, please call main pharmacy at [phone] 12/06/2016 9:59 AM

## 2016-12-06 NOTE — Progress Notes (Addendum)
INFECTIOUS DISEASE PROGRESS NOTE  ID: Judy Kelley is a 51 y.o. female with  Principal Problem:   Bacteremia Active Problems:   ESRD on dialysis ([place])   Hypokalemia   History of ovarian cancer  Subjective: Without complaints On HD  Abtx:  Anti-infectives (From admission, onward)   Start     Dose/Rate Route Frequency Ordered Stop   12/06/16 1200  vancomycin (VANCOCIN) IVPB 1000 mg/200 mL premix     1,000 mg 200 mL/hr over 60 Minutes Intravenous Every M-W-F (Hemodialysis) 12/04/16 1708        Medications:  Scheduled: . calcitRIOL  1 mcg Oral Q M,W,F-HD  . calcium acetate  1,334 mg Oral TID WC  . cinacalcet  90 mg Oral Q breakfast  . multivitamin  1 tablet Oral QHS  . mupirocin ointment  1 application Nasal BID  . nystatin   Topical TID  . sertraline  50 mg Oral Daily    Objective: Vital signs in last 24 hours: Temp:  [97.4 F (36.3 C)-97.9 F (36.6 C)] 97.6 F (36.4 C) (12/03 1002) Pulse Rate:  [50-57] 50 (12/03 1002) Resp:  [16-18] 18 (12/03 1002) BP: (102-113)/(53-64) 107/53 (12/03 1002) SpO2:  [94 %-100 %] 95 % (12/03 1002)   General appearance: alert, cooperative and no distress Resp: clear to auscultation bilaterally Cardio: regular rate and rhythm GI: normal findings: bowel sounds normal and soft, non-tender Extremities: AVG RUE  Lab Results Recent Labs    12/05/16 0502 12/06/16 0612  WBC 5.6 5.2  HGB 6.8* 7.6*  HCT 21.4* 23.9*  NA 137 136  K 3.8 3.5  CL 97* 97*  CO2 27 26  BUN 32* 41*  CREATININE 7.95* 9.58*   Liver Panel Recent Labs    12/03/16 1818  PROT 8.1  ALBUMIN 3.1*  AST 41  ALT 21  ALKPHOS 137*  BILITOT 0.5   Sedimentation Rate No results for input(s): ESRSEDRATE in the last 72 hours. C-Reactive Protein No results for input(s): CRP in the last 72 hours.  Microbiology: Recent Results (from the past 240 hour(s))  Culture, blood (Routine x 2)     Status: None (Preliminary result)   Collection Time: 12/03/16  6:20 PM    Result Value Ref Range Status   Specimen Description BLOOD LEFT HAND  Final   Special Requests   Final    Blood Culture adequate volume BOTTLES DRAWN AEROBIC AND ANAEROBIC   Culture NO GROWTH 3 DAYS  Final   Report Status PENDING  Incomplete  Culture, blood (Routine x 2)     Status: None (Preliminary result)   Collection Time: 12/03/16  9:28 PM  Result Value Ref Range Status   Specimen Description BLOOD LEFT ANTECUBITAL  Final   Special Requests   Final    Blood Culture adequate volume BOTTLES DRAWN AEROBIC ONLY   Culture NO GROWTH 3 DAYS  Final   Report Status PENDING  Incomplete  MRSA PCR Screening     Status: Abnormal   Collection Time: 12/04/16  1:51 AM  Result Value Ref Range Status   MRSA by PCR POSITIVE (A) NEGATIVE Final    Comment:        The GeneXpert MRSA Assay (FDA approved for NASAL specimens only), is one component of a comprehensive MRSA colonization surveillance program. It is not intended to diagnose MRSA infection nor to guide or monitor treatment for MRSA infections. RESULT CALLED TO, READ BACK BY AND VERIFIED WITH: Fulton Reek RN [phone] 12/04/16 A BROWNING  C difficile quick scan w PCR reflex     Status: None   Collection Time: 12/04/16 10:45 AM  Result Value Ref Range Status   C Diff antigen NEGATIVE NEGATIVE Final   C Diff toxin NEGATIVE NEGATIVE Final   C Diff interpretation No C. difficile detected.  Final  Culture, Urine     Status: None   Collection Time: 12/04/16 10:46 AM  Result Value Ref Range Status   Specimen Description URINE, RANDOM  Final   Special Requests NONE  Final   Culture NO GROWTH  Final   Report Status 12/05/2016 FINAL  Final    Studies/Results: Ct Abdomen Pelvis W Wo Contrast  Result Date: 12/06/2016 CLINICAL DATA:  Bladder disorder. EXAM: CT ABDOMEN AND PELVIS WITHOUT AND WITH CONTRAST TECHNIQUE: Multidetector CT imaging of the abdomen and pelvis was performed following the standard protocol before and following the bolus  administration of intravenous contrast. CONTRAST:  153mL ISOVUE-300 IOPAMIDOL (ISOVUE-300) INJECTION 61% COMPARISON:  None. FINDINGS: Lower chest: Lung bases show dependent atelectasis bilaterally. Heart is mildly enlarged. No pericardial or pleural effusion. Distal periesophageal lymph nodes are subcentimeter in short axis size. Distal esophagus is grossly unremarkable. Hepatobiliary: Subcentimeter low-attenuation lesion in the posterior right hepatic lobe is too small to characterize. Liver and gallbladder are otherwise unremarkable. No biliary ductal dilatation. Pancreas: Negative. Spleen: Negative. Adrenals/Urinary Tract: Adrenal glands are unremarkable. No urinary stones. Percutaneous nephrostomy tube in the right kidney which appears somewhat atrophic and scarred. No right hydronephrosis. No residual left renal parenchyma visualized. Residual dilated collecting system in the left renal fossa measures 5.1 x 6.3 cm. Minimal excretion of contrast from the right kidney. Patient reportedly has an ileal conduit. Stomach/Bowel: Stomach is unremarkable. Postoperative changes in the distal small bowel. Large midline ventral abdominal wall hernia contains unobstructed colon. Colon is otherwise unremarkable. Vascular/Lymphatic: Atherosclerotic calcification of the arterial vasculature without abdominal aortic aneurysm. No pathologically enlarged lymph nodes. Reproductive: Uterus is visualized.  No adnexal mass. Other: Presacral thickening. No pleural fluid. There are 2 midline ventral abdominal wall hernias, a smaller and more superiorly located hernia measures 4.1 x 5.8 cm and contains only fat, and a larger hernia that measures 8.4 x 17.8 cm and contains unobstructed colon. Musculoskeletal: No worrisome lytic or sclerotic lesions. Degenerative changes in the spine. IMPRESSION: 1. Right nephrostomy in place without hydronephrosis. No visible left renal parenchyma with a residual dilated collecting system in the left  renal fossa. Patient reportedly has an ileal conduit. 2. Midline ventral abdominal wall hernias, the largest of which contains unobstructed colon. 3.  Aortic atherosclerosis (ICD10-170.0). Electronically Signed   By: Lorin Picket M.D.   On: 12/06/2016 08:21     Assessment/Plan: MRSA bacteremia HD line infection ESRD due to  DVT Previous Ovarian CA  Total days of antibiotics: 3 vanco  Repeat BCx 11-30 pending/ngtd tte pending, ngtd.  BCx previously done at [facility] HD [phone])   11-27 MRSA- 2/2 (S- vanco/tet/clinda /gent/rifampin/linezolid/tygacil) Provided her TTE is negative and her repeat BCx are negative, would recc 2 weeks of vanco with HD. Re-implant HD line, if needed, when repeat BCx are negative.     Bobby Rumpf MD, FACP Infectious Diseases (pager) [phone] [email or web address] 12/06/2016, 4:31 PM  LOS: 3 days

## 2016-12-07 ENCOUNTER — Inpatient Hospital Stay (HOSPITAL_COMMUNITY): Payer: Medicare Other

## 2016-12-07 DIAGNOSIS — A419 Sepsis, unspecified organism: Secondary | ICD-10-CM

## 2016-12-07 DIAGNOSIS — E876 Hypokalemia: Secondary | ICD-10-CM

## 2016-12-07 LAB — BASIC METABOLIC PANEL WITH GFR
Anion gap: 12 (ref 5–15)
BUN: 18 mg/dL (ref 6–20)
CO2: 26 mmol/L (ref 22–32)
Calcium: 8.2 mg/dL — ABNORMAL LOW (ref 8.9–10.3)
Chloride: 98 mmol/L — ABNORMAL LOW (ref 101–111)
Creatinine, Ser: 5.81 mg/dL — ABNORMAL HIGH (ref 0.44–1.00)
GFR calc Af Amer: 9 mL/min — ABNORMAL LOW
GFR calc non Af Amer: 8 mL/min — ABNORMAL LOW
Glucose, Bld: 85 mg/dL (ref 65–99)
Potassium: 3.8 mmol/L (ref 3.5–5.1)
Sodium: 136 mmol/L (ref 135–145)

## 2016-12-07 LAB — CBC
HCT: 27.1 % — ABNORMAL LOW (ref 36.0–46.0)
Hemoglobin: 8.7 g/dL — ABNORMAL LOW (ref 12.0–15.0)
MCH: 31.8 pg (ref 26.0–34.0)
MCHC: 32.1 g/dL (ref 30.0–36.0)
MCV: 98.9 fL (ref 78.0–100.0)
Platelets: 343 10*3/uL (ref 150–400)
RBC: 2.74 MIL/uL — ABNORMAL LOW (ref 3.87–5.11)
RDW: 14.8 % (ref 11.5–15.5)
WBC: 7.6 10*3/uL (ref 4.0–10.5)

## 2016-12-07 LAB — APTT: aPTT: 79 s — ABNORMAL HIGH (ref 24–36)

## 2016-12-07 LAB — HEPARIN LEVEL (UNFRACTIONATED): Heparin Unfractionated: 1.48 [IU]/mL — ABNORMAL HIGH (ref 0.30–0.70)

## 2016-12-07 LAB — HEPATITIS B SURFACE ANTIGEN: Hepatitis B Surface Ag: NEGATIVE

## 2016-12-07 MED ORDER — CAMPHOR-MENTHOL 0.5-0.5 % EX LOTN
TOPICAL_LOTION | CUTANEOUS | Status: DC | PRN
  Filled 2016-12-07: qty 222

## 2016-12-07 NOTE — Progress Notes (Signed)
Subjective:  No cos , tolerated HD yest   Objective Vital signs in last 24 hours: Vitals:   12/06/16 2040 12/06/16 2318 12/07/16 0524 12/07/16 1002  BP: 103/60 (!) 109/50 (!) 95/51 (!) 96/51  Pulse: (!) 52 62 (!) 58 (!) 57  Resp: 18 18 18 18   Temp: 98 F (36.7 C) 97.8 F (36.6 C) 98.4 F (36.9 C) 97.6 F (36.4 C)  TempSrc: Oral Oral Oral Oral  SpO2: 98% 100% 96% 95%  Weight: 115.2 kg (253 lb 15.5 oz) 115.2 kg (253 lb 15.5 oz)    Height:       Weight change:   Physical Exam: General: alert , pleasant Obese  ,OX3  Heart: RRR ,no m,r,g Lungs: CTA Abdomen: Obese , Bs +, soft < NT, ND , Left sided Nephrostomy tube site with  dried blood at site Extremities: Dialysis Access: Pos bruit RUA AVGG  Dialysis Orders: [facility] [place] 4 hr EDW 117.5 2 K 2 Ca AVGG heparin 5000 venofer 50 mircera 100 q 2 weeks - last dose 11/27 - calcitriol 1- no Fe right upper AVGG Recent labs: hgb 7.7 dropping 11% sat, no ferritin, no K ipTH 461   Problem/Plan: 1. MRSA bacteremia thought secondary to catheter sepsis (+ BC and perm cath exit drainage cultures 11/27). R IJ Perm cath removed 11/30/1/8  / On IV Vanco. ID seeing has  TTE yest prelim  No vegs  Seen  /repeat BC no growth so far. 2. ESRD- TTS K 3> 3.8>3.5> 3.8 this am  - intake improving  losing weight- deferred SatHD her / Had hd yest. and back on schedule-today  hold HD  heparin due to bloody abd drainage 3. Hypertension/volume- BP 96/51 asymp  this am  not on BP meds  - losing weight/ last wt 115.2    4. Anemia- hgb 7.1> 6.8> 7.6>8.7  12/2given. 1 u PRBCS /Fe def anemia - tsat 11% OP hold fe lod sec Bacteremia  -Mircera last dose100 11/30/16  - next would be scheduled 12/14/16 5. Metabolic bone disease- Continue calcitriol, sensipar 90, calcium acetate 6. Nutrition- alb 3.1 holding  nepro due to diarrhea - now eating  Better   7. 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 Jeffie Pollock seeing rec holding Eliquis/ noted "for Endoscopy of conduit this week if possible " 9. Hx ovarianvs cervicalcancers/p prior chemo/radiation( In [place]. )but not surgery- "cancer free since 2000" no records available   Ernest Haber, PA-C [facility] Beeper [phone] 12/07/2016,11:39 AM  LOS: 4 days   Labs: Basic Metabolic Panel: Recent Labs  Lab 12/05/16 0502 12/06/16 0612 12/07/16 0509  NA 137 136 136  K 3.8 3.5 3.8  CL 97* 97* 98*  CO2 27 26 26   GLUCOSE 79 82 85  BUN 32* 41* 18  CREATININE 7.95* 9.58* 5.81*  CALCIUM 8.7* 8.2* 8.2*   Liver Function Tests: Recent Labs  Lab 12/03/16 1818  AST 41  ALT 21  ALKPHOS 137*  BILITOT 0.5  PROT 8.1  ALBUMIN 3.1*   CBC: Recent Labs  Lab 12/03/16 1818 12/04/16 0611 12/05/16 0502 12/06/16 0612 12/07/16 0509  WBC 7.7 5.4 5.6 5.2 7.6  NEUTROABS 5.9  --   --   --   --   HGB 8.0* 7.1* 6.8* 7.6* 8.7*  HCT  25.0* 22.1* 21.4* 23.9* 27.1*  MCV 97.3 97.8 98.2 97.6 98.9  PLT 314 226 336 290 343    Medications: . heparin 1,900 Units/hr (12/07/16 0928)  . vancomycin Stopped (12/06/16 2311)   . calcitRIOL  1 mcg Oral Q M,W,F-HD  . calcium acetate  1,334 mg Oral TID WC  . cinacalcet  90 mg Oral Q breakfast  . multivitamin  1 tablet Oral QHS  . mupirocin ointment  1 application Nasal BID  . nystatin   Topical TID  . sertraline  50 mg Oral Daily

## 2016-12-07 NOTE — Progress Notes (Signed)
[place] for heparin while apixaban on hold  Indication: hx of DVT  Allergies  Allergen Reactions  . Erythromycin Anaphylaxis  . Ciprofloxacin     IV only-burning to her veins, can take PO  . Chlorhexidine Rash  . Other Rash    chloraprep    Patient Measurements: Heparin Dosing Weight: 88 kg  Vital Signs: Temp: 97.6 F (36.4 C) (12/04 1002) Temp Source: Oral (12/04 1002) BP: 96/51 (12/04 1002) Pulse Rate: 57 (12/04 1002)  Labs: Recent Labs    12/05/16 0502  12/05/16 1749 12/06/16 0612 12/06/16 2305 12/07/16 0509 12/07/16 0814  HGB 6.8*  --   --  7.6*  --  8.7*  --   HCT 21.4*  --   --  23.9*  --  27.1*  --   PLT 336  --   --  290  --  343  --   APTT  --    < > 40* 40* 39*  --  79*  HEPARINUNFRC  --   --  >2.20* >2.20*  --   --   --   CREATININE 7.95*  --   --  9.58*  --  5.81*  --    < > = values in this interval not displayed.    Assessment: 51 yo F on apixaban PTA for hx of DVT admitted 12/1 with MRSA bacteremia and concern for bleeding/hemorrhage from defunctionalized ileal conduit. Transitioning to heparin gtt while w/u ongoing. Last dose of apixaban 12/2AM.  Heparin level this morning still elevated at 1.48units/mL (reflective of Eliquis) and aPTT in range at 79 seconds- not yet correlating, will continue to use aPTTs to adjust heparin rate. No bleeding/oozing noticed.  Hgb 8.7, plts 343- no bleeding noted.  Noted plans by urology to potentially take for endoscopy of conduit this week.   Goal of Therapy:  Heparin level 0.3-0.7 units/ml aPTT 66-102 seconds Monitor platelets by anticoagulation protocol: Yes   Plan:  Continue heparin at 1900 units/hr Daily heparin level and aPTT until correlating; daily CBC Follow switch back to oral agent when feasible   Aleiya Rye D. Tilman Mcclaren, PharmD, BCPS Clinical Pharmacist Clinical Phone for 12/07/2016 until 3:30pm: [phone] If after 3:30pm, please call main pharmacy at  [place] 12/07/2016 10:12 AM

## 2016-12-07 NOTE — Procedures (Signed)
Patient seen and examined on Hemodialysis. QB 400 R UE AVG UF goal 1.5L.   Tolerating treatment well.  No complaints today.  Treatment adjusted as needed.  Madelon Lips MD [facility] pgr [phone] 3:41 PM

## 2016-12-07 NOTE — Progress Notes (Signed)
INFECTIOUS DISEASE PROGRESS NOTE  ID: Judy Kelley is a 51 y.o. female with  Principal Problem:   Bacteremia Active Problems:   ESRD on dialysis ([place])   Hypokalemia   History of ovarian cancer   MRSA bacteremia  Subjective: Complains of back pain. She atttributes this to her nephrostomy. It is unchanged recently.   Abtx:  Anti-infectives (From admission, onward)   Start     Dose/Rate Route Frequency Ordered Stop   12/06/16 1736  vancomycin (VANCOCIN) 1-5 GM/200ML-% IVPB    Comments:  Cherylann Banas   : cabinet override      12/06/16 1736 12/07/16 0544   12/06/16 1200  vancomycin (VANCOCIN) IVPB 1000 mg/200 mL premix     1,000 mg 200 mL/hr over 60 Minutes Intravenous Every M-W-F (Hemodialysis) 12/04/16 1708        Medications:  Scheduled: . calcitRIOL  1 mcg Oral Q M,W,F-HD  . calcium acetate  1,334 mg Oral TID WC  . cinacalcet  90 mg Oral Q breakfast  . multivitamin  1 tablet Oral QHS  . mupirocin ointment  1 application Nasal BID  . nystatin   Topical TID  . sertraline  50 mg Oral Daily    Objective: Vital signs in last 24 hours: Temp:  [97.6 F (36.4 C)-98.4 F (36.9 C)] 97.6 F (36.4 C) (12/04 1002) Pulse Rate:  [46-62] 57 (12/04 1002) Resp:  [18] 18 (12/04 1002) BP: (84-109)/(50-63) 96/51 (12/04 1002) SpO2:  [95 %-100 %] 95 % (12/04 1002) Weight:  [115.2 kg (253 lb 15.5 oz)-116.2 kg (256 lb 2.8 oz)] 115.2 kg (253 lb 15.5 oz) (12/03 2318)   General appearance: alert, cooperative and no distress Resp: clear to auscultation bilaterally Chest wall: no tenderness, induration at previous line site.  Cardio: regular rate and rhythm GI: normal findings: bowel sounds normal and soft, non-tender Extremities: edema trace BLE  Lab Results Recent Labs    12/06/16 0612 12/07/16 0509  WBC 5.2 7.6  HGB 7.6* 8.7*  HCT 23.9* 27.1*  NA 136 136  K 3.5 3.8  CL 97* 98*  CO2 26 26  BUN 41* 18  CREATININE 9.58* 5.81*   Liver Panel No results for input(s): PROT,  ALBUMIN, AST, ALT, ALKPHOS, BILITOT, BILIDIR, IBILI in the last 72 hours. Sedimentation Rate No results for input(s): ESRSEDRATE in the last 72 hours. C-Reactive Protein No results for input(s): CRP in the last 72 hours.  Microbiology: Recent Results (from the past 240 hour(s))  Culture, blood (Routine x 2)     Status: None (Preliminary result)   Collection Time: 12/03/16  6:20 PM  Result Value Ref Range Status   Specimen Description BLOOD LEFT HAND  Final   Special Requests   Final    Blood Culture adequate volume BOTTLES DRAWN AEROBIC AND ANAEROBIC   Culture NO GROWTH 4 DAYS  Final   Report Status PENDING  Incomplete  Culture, blood (Routine x 2)     Status: None (Preliminary result)   Collection Time: 12/03/16  9:28 PM  Result Value Ref Range Status   Specimen Description BLOOD LEFT ANTECUBITAL  Final   Special Requests   Final    Blood Culture adequate volume BOTTLES DRAWN AEROBIC ONLY   Culture NO GROWTH 4 DAYS  Final   Report Status PENDING  Incomplete  MRSA PCR Screening     Status: Abnormal   Collection Time: 12/04/16  1:51 AM  Result Value Ref Range Status   MRSA by PCR POSITIVE (A) NEGATIVE  Final    Comment:        The GeneXpert MRSA Assay (FDA approved for NASAL specimens only), is one component of a comprehensive MRSA colonization surveillance program. It is not intended to diagnose MRSA infection nor to guide or monitor treatment for MRSA infections. RESULT CALLED TO, READ BACK BY AND VERIFIED WITH: Fulton Reek RN [phone] 12/04/16 A BROWNING   C difficile quick scan w PCR reflex     Status: None   Collection Time: 12/04/16 10:45 AM  Result Value Ref Range Status   C Diff antigen NEGATIVE NEGATIVE Final   C Diff toxin NEGATIVE NEGATIVE Final   C Diff interpretation No C. difficile detected.  Final  Culture, Urine     Status: None   Collection Time: 12/04/16 10:46 AM  Result Value Ref Range Status   Specimen Description URINE, RANDOM  Final   Special Requests  NONE  Final   Culture NO GROWTH  Final   Report Status 12/05/2016 FINAL  Final    Studies/Results: Ct Abdomen Pelvis W Wo Contrast  Result Date: 12/06/2016 CLINICAL DATA:  Bladder disorder. EXAM: CT ABDOMEN AND PELVIS WITHOUT AND WITH CONTRAST TECHNIQUE: Multidetector CT imaging of the abdomen and pelvis was performed following the standard protocol before and following the bolus administration of intravenous contrast. CONTRAST:  153mL ISOVUE-300 IOPAMIDOL (ISOVUE-300) INJECTION 61% COMPARISON:  None. FINDINGS: Lower chest: Lung bases show dependent atelectasis bilaterally. Heart is mildly enlarged. No pericardial or pleural effusion. Distal periesophageal lymph nodes are subcentimeter in short axis size. Distal esophagus is grossly unremarkable. Hepatobiliary: Subcentimeter low-attenuation lesion in the posterior right hepatic lobe is too small to characterize. Liver and gallbladder are otherwise unremarkable. No biliary ductal dilatation. Pancreas: Negative. Spleen: Negative. Adrenals/Urinary Tract: Adrenal glands are unremarkable. No urinary stones. Percutaneous nephrostomy tube in the right kidney which appears somewhat atrophic and scarred. No right hydronephrosis. No residual left renal parenchyma visualized. Residual dilated collecting system in the left renal fossa measures 5.1 x 6.3 cm. Minimal excretion of contrast from the right kidney. Patient reportedly has an ileal conduit. Stomach/Bowel: Stomach is unremarkable. Postoperative changes in the distal small bowel. Large midline ventral abdominal wall hernia contains unobstructed colon. Colon is otherwise unremarkable. Vascular/Lymphatic: Atherosclerotic calcification of the arterial vasculature without abdominal aortic aneurysm. No pathologically enlarged lymph nodes. Reproductive: Uterus is visualized.  No adnexal mass. Other: Presacral thickening. No pleural fluid. There are 2 midline ventral abdominal wall hernias, a smaller and more superiorly  located hernia measures 4.1 x 5.8 cm and contains only fat, and a larger hernia that measures 8.4 x 17.8 cm and contains unobstructed colon. Musculoskeletal: No worrisome lytic or sclerotic lesions. Degenerative changes in the spine. IMPRESSION: 1. Right nephrostomy in place without hydronephrosis. No visible left renal parenchyma with a residual dilated collecting system in the left renal fossa. Patient reportedly has an ileal conduit. 2. Midline ventral abdominal wall hernias, the largest of which contains unobstructed colon. 3.  Aortic atherosclerosis (ICD10-170.0). Electronically Signed   By: Lorin Picket M.D.   On: 12/06/2016 08:21     Assessment/Plan: MRSA bacteremia HD line infection ESRD due to  DVT Previous Ovarian CA  Total days of antibiotics: 4 vanco  tte does not mention valves  Repeat BCx (11-30) ngtd.  BCx previously done at [facility] HD [phone])              11-27 MRSA- 2/2 (S- vanco/tet/clinda /gent/rifampin/linezolid/tygacil) Would u/s her chest wall  Provided her TTE is negative and  her repeat BCx are negative, would recc 2 weeks of vanco with HD. Re-implant HD line, if needed, when repeat BCx are negative.            Bobby Rumpf MD, FACP Infectious Diseases (pager) [phone] [email or web address] 12/07/2016, 12:50 PM  LOS: 4 days

## 2016-12-07 NOTE — Progress Notes (Signed)
PROGRESS NOTE    Judy Kelley  GMW:[id number] DOB: [birth year] DOA: 12/03/2016 PCP: System, Pcp Not In   Chief Complaint  Patient presents with  . Abnormal Lab    Brief Narrative:  HPI on 12/04/2016 by Dr. Gean Birchwood Judy Kelley is a 51 y.o. female with history of ESRD on hemodialysis, history of DVT, previous history of ovarian cancer in remission who had just recently moved from [place] was found to have redness around her right subclavian dialysis catheter 3 days ago and had associated fever and chills.  Blood cultures were obtained at dialysis center and patient was empirically started on vancomycin.  Patient had received her second dose of vancomycin today at dialysis center.  Cultures came back as staph aureus today and was instructed to come to the ER.  Over the last 2 days patient also has been having poor appetite with nausea vomiting and diarrhea.  Denies any abdominal pain but has chronic pain around the right flank area where her nephrostomy tube is.  Patient's dialysis catheter was removed 2 days ago by vascular surgeon.  Assessment & Plan   MRSA bacteremia -Likely secondary to dialysis catheter (right subclavian) that was removed a few days ago prior to admission -Placed on vancomycin -Initial blood cultures obtained as an outpatient, per nephrology showed to be MRSA -blood cultures (12/03/2016) currently show no growth to date -Infectious disease consulted and appreciated- recommended 2 weeks of IV vanc with HD -Echocardiogram EF 60-65%, grade 2DD.   ESRD -Nephrology consulted and appreciated -dialyzes TTS  Hypokalemia -Resolved with replacement -Likely secondary to GI losses, patient did have diarrhea prior to admission -Continue to monitor BMP -Magnesium 2.3  Diarrhea -Subsided prior to admission -C. 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 (back in early 2000s)- currently with bleeding from stoma -urology consulted and appreciated, plan for endoscopy of the conduit this week if possible -CT showed right nephrostomy in place without hydronephrosis  Dermatitis/rash -appears to be more fungal vs pityriasis rosea (this would be self limited with only symptomatic treatment), only on flanks and B/L -Continue nystatin powder, sarna and continue to monitor   Morbid obesity  -BMI 40 -will need to discuss weight management with PCP on discharge  DVT Prophylaxis  Eliquis --> heparin  Code Status: Full  Family Communication: None at bedside  Disposition Plan: admitted  Consultants Nephrology Infectious disease Urology  Procedures  None  Antibiotics   Anti-infectives (From admission, onward)   Start     Dose/Rate Route Frequency Ordered Stop   12/06/16 1736  vancomycin (VANCOCIN) 1-5 GM/200ML-% IVPB    Comments:  Cherylann Banas   : cabinet override      12/06/16 1736 12/07/16 0544   12/06/16 1200  vancomycin (VANCOCIN) IVPB 1000 mg/200 mL premix     1,000 mg 200 mL/hr over 60 Minutes Intravenous Every M-W-F (Hemodialysis) 12/04/16 1708        Subjective:   Judy Kelley seen and examined today.  Complains of itchiness on her flank and back. Denies  chest pain, shortness of breath, abdominal pain, current nausea, vomiting. Feels bleeding from her stoma has slowed down.  Objective:   Vitals:   12/06/16 2040 12/06/16 2318 12/07/16 0524 12/07/16 1002  BP: 103/60 (!) 109/50 (!) 95/51 (!) 96/51   Pulse: (!) 52 62 (!) 58 (!) 57  Resp: 18 18 18 18   Temp: 98 F (36.7 C) 97.8 F (36.6 C) 98.4 F (36.9 C) 97.6 F (36.4 C)  TempSrc: Oral Oral Oral Oral  SpO2: 98% 100% 96% 95%  Weight: 115.2 kg (253 lb 15.5 oz) 115.2 kg (253 lb 15.5 oz)    Height:        Intake/Output Summary (Last 24 hours) at 12/07/2016 1316 Last data filed at 12/07/2016 0600 Gross per 24 hour  Intake 1167.8 ml  Output 1325 ml  Net -157.2 ml   Filed Weights   12/06/16 1635 12/06/16 2040 12/06/16 2318  Weight: 116.2 kg (256 lb 2.8 oz) 115.2 kg (253 lb 15.5 oz) 115.2 kg (253 lb 15.5 oz)   Exam  General: Well developed, well nourished, NAD, appears stated age  HEENT: NCAT, mucous membranes moist. Poor dentition  Cardiovascular: S1 S2 auscultated, RRR, no murmurs  Respiratory: Clear to auscultation bilaterally with equal chest rise  Abdomen: Soft, obese, nontender, nondistended, + bowel sounds. Stoma with old bood  Extremities: warm dry without cyanosis clubbing. Trace LE edema.  Neuro: AAOx3, nonfocal  Skin: dermatitis- scaly type rash  Psych: Normal affect and demeanor, pleasant  Data Reviewed: I have personally reviewed following labs and imaging studies  CBC: Recent Labs  Lab 12/03/16 1818 12/04/16 0611 12/05/16 0502 12/06/16 0612 12/07/16 0509  WBC 7.7 5.4 5.6 5.2 7.6  NEUTROABS 5.9  --   --   --   --   HGB 8.0* 7.1* 6.8* 7.6* 8.7*  HCT 25.0* 22.1* 21.4* 23.9* 27.1*  MCV 97.3 97.8 98.2 97.6 98.9  PLT 314 226 336 290 294   Basic Metabolic Panel: Recent Labs  Lab 12/03/16 1818 12/04/16 0611 12/05/16 0502 12/06/16 0612 12/07/16 0509  NA 137 138 137 136 136  K 2.8* 3.0* 3.8 3.5 3.8  CL 93* 96* 97* 97* 98*  CO2 28 28 27 26 26   GLUCOSE 94 94 79 82 85  BUN 14 20 32* 41* 18  CREATININE 4.55* 6.24* 7.95* 9.58* 5.81*  CALCIUM 8.5* 8.2* 8.7* 8.2* 8.2*  MG  --   --  2.3  --   --    GFR: Estimated Creatinine Clearance: 14.8 mL/min (A) (by C-G formula based on SCr of 5.81 mg/dL  (H)). Liver Function Tests: Recent Labs  Lab 12/03/16 1818  AST 41  ALT 21  ALKPHOS 137*  BILITOT 0.5  PROT 8.1  ALBUMIN 3.1*   No results for input(s): LIPASE, AMYLASE in the last 168 hours. No results for input(s): AMMONIA in the last 168 hours. Coagulation Profile: Recent Labs  Lab 12/03/16 1818  INR 1.14   Cardiac Enzymes: No results for input(s): CKTOTAL, CKMB, CKMBINDEX, TROPONINI in the last 168 hours. BNP (last 3 results) No results for input(s): PROBNP in the last 8760 hours. HbA1C: No results for input(s): HGBA1C in the last 72 hours. CBG: No results for input(s): GLUCAP in the last 168 hours. Lipid Profile: No results for input(s): CHOL, HDL, LDLCALC, TRIG, CHOLHDL, LDLDIRECT in the last 72 hours. Thyroid Function Tests: No results for input(s): TSH, T4TOTAL, FREET4, T3FREE, THYROIDAB in the last 72 hours. Anemia Panel: No results for input(s): VITAMINB12, FOLATE, FERRITIN, TIBC,  IRON, RETICCTPCT in the last 72 hours. Urine analysis:    Component Value Date/Time   COLORURINE YELLOW 12/03/2016 1955   APPEARANCEUR HAZY (A) 12/03/2016 1955   LABSPEC 1.010 12/03/2016 1955   PHURINE 9.0 (H) 12/03/2016 [place] NEGATIVE 12/03/2016 1955   HGBUR SMALL (A) 12/03/2016 [place] NEGATIVE 12/03/2016 [place] NEGATIVE 12/03/2016 1955   PROTEINUR 100 (A) 12/03/2016 1955   NITRITE NEGATIVE 12/03/2016 1955   LEUKOCYTESUR LARGE (A) 12/03/2016 1955   Sepsis Labs: @LABRCNTIP (procalcitonin:4,lacticidven:4)  ) Recent Results (from the past 240 hour(s))  Culture, blood (Routine x 2)     Status: None (Preliminary result)   Collection Time: 12/03/16  6:20 PM  Result Value Ref Range Status   Specimen Description BLOOD LEFT HAND  Final   Special Requests   Final    Blood Culture adequate volume BOTTLES DRAWN AEROBIC AND ANAEROBIC   Culture NO GROWTH 4 DAYS  Final   Report Status PENDING  Incomplete  Culture, blood (Routine x 2)     Status: None  (Preliminary result)   Collection Time: 12/03/16  9:28 PM  Result Value Ref Range Status   Specimen Description BLOOD LEFT ANTECUBITAL  Final   Special Requests   Final    Blood Culture adequate volume BOTTLES DRAWN AEROBIC ONLY   Culture NO GROWTH 4 DAYS  Final   Report Status PENDING  Incomplete  MRSA PCR Screening     Status: Abnormal   Collection Time: 12/04/16  1:51 AM  Result Value Ref Range Status   MRSA by PCR POSITIVE (A) NEGATIVE Final    Comment:        The GeneXpert MRSA Assay (FDA approved for NASAL specimens only), is one component of a comprehensive MRSA colonization surveillance program. It is not intended to diagnose MRSA infection nor to guide or monitor treatment for MRSA infections. RESULT CALLED TO, READ BACK BY AND VERIFIED WITH: Fulton Reek RN [phone] 12/04/16 A BROWNING   C difficile quick scan w PCR reflex     Status: None   Collection Time: 12/04/16 10:45 AM  Result Value Ref Range Status   C Diff antigen NEGATIVE NEGATIVE Final   C Diff toxin NEGATIVE NEGATIVE Final   C Diff interpretation No C. difficile detected.  Final  Culture, Urine     Status: None   Collection Time: 12/04/16 10:46 AM  Result Value Ref Range Status   Specimen Description URINE, RANDOM  Final   Special Requests NONE  Final   Culture NO GROWTH  Final   Report Status 12/05/2016 FINAL  Final      Radiology Studies: Ct Abdomen Pelvis W Wo Contrast  Result Date: 12/06/2016 CLINICAL DATA:  Bladder disorder. EXAM: CT ABDOMEN AND PELVIS WITHOUT AND WITH CONTRAST TECHNIQUE: Multidetector CT imaging of the abdomen and pelvis was performed following the standard protocol before and following the bolus administration of intravenous contrast. CONTRAST:  143mL ISOVUE-300 IOPAMIDOL (ISOVUE-300) INJECTION 61% COMPARISON:  None. FINDINGS: Lower chest: Lung bases show dependent atelectasis bilaterally. Heart is mildly enlarged. No pericardial or pleural effusion. Distal periesophageal lymph nodes are  subcentimeter in short axis size. Distal esophagus is grossly unremarkable. Hepatobiliary: Subcentimeter low-attenuation lesion in the posterior right hepatic lobe is too small to characterize. Liver and gallbladder are otherwise unremarkable. No biliary ductal dilatation. Pancreas: Negative. Spleen: Negative. Adrenals/Urinary Tract: Adrenal glands are unremarkable. No urinary stones. Percutaneous nephrostomy tube in the right kidney which appears somewhat atrophic and scarred. No  right hydronephrosis. No residual left renal parenchyma visualized. Residual dilated collecting system in the left renal fossa measures 5.1 x 6.3 cm. Minimal excretion of contrast from the right kidney. Patient reportedly has an ileal conduit. Stomach/Bowel: Stomach is unremarkable. Postoperative changes in the distal small bowel. Large midline ventral abdominal wall hernia contains unobstructed colon. Colon is otherwise unremarkable. Vascular/Lymphatic: Atherosclerotic calcification of the arterial vasculature without abdominal aortic aneurysm. No pathologically enlarged lymph nodes. Reproductive: Uterus is visualized.  No adnexal mass. Other: Presacral thickening. No pleural fluid. There are 2 midline ventral abdominal wall hernias, a smaller and more superiorly located hernia measures 4.1 x 5.8 cm and contains only fat, and a larger hernia that measures 8.4 x 17.8 cm and contains unobstructed colon. Musculoskeletal: No worrisome lytic or sclerotic lesions. Degenerative changes in the spine. IMPRESSION: 1. Right nephrostomy in place without hydronephrosis. No visible left renal parenchyma with a residual dilated collecting system in the left renal fossa. Patient reportedly has an ileal conduit. 2. Midline ventral abdominal wall hernias, the largest of which contains unobstructed colon. 3.  Aortic atherosclerosis (ICD10-170.0). Electronically Signed   By: Lorin Picket M.D.   On: 12/06/2016 08:21     Scheduled Meds: . calcitRIOL  1  mcg Oral Q M,W,F-HD  . calcium acetate  1,334 mg Oral TID WC  . cinacalcet  90 mg Oral Q breakfast  . multivitamin  1 tablet Oral QHS  . mupirocin ointment  1 application Nasal BID  . nystatin   Topical TID  . sertraline  50 mg Oral Daily   Continuous Infusions: . heparin 1,900 Units/hr (12/07/16 0928)  . vancomycin Stopped (12/06/16 2311)     LOS: 4 days   Time Spent in minutes   30 minutes  Theoplis Garciagarcia D.O. on 12/07/2016 at 1:16 PM  Between 7am to 7pm - Pager - [phone]  After 7pm go to [email or web address] - password [email or web address]  And look for the night coverage person covering for me after hours  [facility] Office  [phone]

## 2016-12-07 NOTE — Progress Notes (Signed)
Assessment and Plan: Bleeding from stoma:  Bleeding has improved but persists.   I will go ahead and try to arrange for endoscopy of the conduit this week if possible. Subjective: Judy Kelley reports reduced but persistent  bleeding from the stoma and has no associated signs or symptoms.     ROS:  ROS  Anti-infectives: Anti-infectives (From admission, onward)   Start     Dose/Rate Route Frequency Ordered Stop   12/06/16 1736  vancomycin (VANCOCIN) 1-5 GM/200ML-% IVPB    Comments:  Cherylann Banas   : cabinet override      12/06/16 1736 12/07/16 0544   12/06/16 1200  vancomycin (VANCOCIN) IVPB 1000 mg/200 mL premix     1,000 mg 200 mL/hr over 60 Minutes Intravenous Every M-W-F (Hemodialysis) 12/04/16 1708        Current Facility-Administered Medications  Medication Dose Route Frequency Provider Last Rate Last Dose  . acetaminophen (TYLENOL) tablet 650 mg  650 mg Oral Q6H PRN Rise Patience, MD       Or  . acetaminophen (TYLENOL) suppository 650 mg  650 mg Rectal Q6H PRN Rise Patience, MD      . calcitRIOL (ROCALTROL) capsule 1 mcg  1 mcg Oral Q M,W,F-HD Cristal Ford, DO   1 mcg at 12/06/16 1940  . calcium acetate (PHOSLO) capsule 1,334 mg  1,334 mg Oral TID WC Rise Patience, MD   1,334 mg at 12/06/16 1230  . calcium acetate (PHOSLO) capsule 667 mg  667 mg Oral PRN Rise Patience, MD      . cinacalcet [facility]) tablet 90 mg  90 mg Oral Q breakfast Rise Patience, MD   90 mg at 12/06/16 0902  . diphenhydrAMINE (BENADRYL) capsule 25 mg  25 mg Oral Q6H PRN Lovey Newcomer T, NP   25 mg at 12/06/16 0012  . heparin ADULT infusion 100 units/mL (25000 units/21mL sodium chloride 0.45%)  1,900 Units/hr Intravenous Continuous Laren Everts, RPH 19 mL/hr at 12/07/16 0044 1,900 Units/hr at 12/07/16 0044  . multivitamin (RENA-VIT) tablet 1 tablet  1 tablet Oral QHS Alric Seton, PA-C   1 tablet at 12/06/16 2311  . mupirocin ointment (BACTROBAN) 2 % 1  application  1 application Nasal BID Rise Patience, MD   1 application at 40/98/11 2321  . nystatin (MYCOSTATIN/NYSTOP) topical powder   Topical TID Cristal Ford, DO      . ondansetron [facility]) tablet 4 mg  4 mg Oral Q6H PRN Rise Patience, MD   4 mg at 12/05/16 9147   Or  . ondansetron (ZOFRAN) injection 4 mg  4 mg Intravenous Q6H PRN Rise Patience, MD   4 mg at 12/04/16 1322  . oxyCODONE (Oxy IR/ROXICODONE) immediate release tablet 5 mg  5 mg Oral Q4H PRN Rise Patience, MD   5 mg at 12/06/16 2319  . promethazine (PHENERGAN) tablet 25 mg  25 mg Oral Q6H PRN Rise Patience, MD      . sertraline (ZOLOFT) tablet 50 mg  50 mg Oral Daily Rise Patience, MD   50 mg at 12/06/16 0905  . vancomycin (VANCOCIN) IVPB 1000 mg/200 mL premix  1,000 mg Intravenous Q M,W,F-HD Cristal Ford, DO   Stopped at 12/06/16 2311     Objective: Vital signs in last 24 hours: Temp:  [97.6 F (36.4 C)-98.4 F (36.9 C)] 98.4 F (36.9 C) (12/04 0524) Pulse Rate:  [46-62] 58 (12/04 0524) Resp:  [18] 18 (12/04 0524) BP: (  84-109)/(50-63) 95/51 (12/04 0524) SpO2:  [95 %-100 %] 96 % (12/04 0524) Weight:  [115.2 kg (253 lb 15.5 oz)-116.2 kg (256 lb 2.8 oz)] 115.2 kg (253 lb 15.5 oz) (12/03 2318)  Intake/Output from previous day: 12/03 0701 - 12/04 0700 In: 1407.8 [P.O.:720; I.V.:487.8; IV Piggyback:200] Out: 1000  Intake/Output this shift: Total I/O In: 807.8 [P.O.:120; I.V.:487.8; IV Piggyback:200] Out: 1000 [Other:1000]   Physical Exam  Constitutional: She is well-developed, well-nourished, and in no distress.  Abdominal:  [name] is old blood on the stoma dressing in the RLQ.     Lab Results:  Recent Labs    12/06/16 0612 12/07/16 0509  WBC 5.2 7.6  HGB 7.6* 8.7*  HCT 23.9* 27.1*  PLT 290 343   BMET Recent Labs    12/06/16 0612 12/07/16 0509  NA 136 136  K 3.5 3.8  CL 97* 98*  CO2 26 26  GLUCOSE 82 85  BUN 41* 18  CREATININE 9.58* 5.81*  CALCIUM  8.2* 8.2*   PT/INR No results for input(s): LABPROT, INR in the last 72 hours. ABG No results for input(s): PHART, HCO3 in the last 72 hours.  Invalid input(s): PCO2, PO2  Studies/Results: Ct Abdomen Pelvis W Wo Contrast  Result Date: 12/06/2016 CLINICAL DATA:  Bladder disorder. EXAM: CT ABDOMEN AND PELVIS WITHOUT AND WITH CONTRAST TECHNIQUE: Multidetector CT imaging of the abdomen and pelvis was performed following the standard protocol before and following the bolus administration of intravenous contrast. CONTRAST:  163mL ISOVUE-300 IOPAMIDOL (ISOVUE-300) INJECTION 61% COMPARISON:  None. FINDINGS: Lower chest: Lung bases show dependent atelectasis bilaterally. Heart is mildly enlarged. No pericardial or pleural effusion. Distal periesophageal lymph nodes are subcentimeter in short axis size. Distal esophagus is grossly unremarkable. Hepatobiliary: Subcentimeter low-attenuation lesion in the posterior right hepatic lobe is too small to characterize. Liver and gallbladder are otherwise unremarkable. No biliary ductal dilatation. Pancreas: Negative. Spleen: Negative. Adrenals/Urinary Tract: Adrenal glands are unremarkable. No urinary stones. Percutaneous nephrostomy tube in the right kidney which appears somewhat atrophic and scarred. No right hydronephrosis. No residual left renal parenchyma visualized. Residual dilated collecting system in the left renal fossa measures 5.1 x 6.3 cm. Minimal excretion of contrast from the right kidney. Patient reportedly has an ileal conduit. Stomach/Bowel: Stomach is unremarkable. Postoperative changes in the distal small bowel. Large midline ventral abdominal wall hernia contains unobstructed colon. Colon is otherwise unremarkable. Vascular/Lymphatic: Atherosclerotic calcification of the arterial vasculature without abdominal aortic aneurysm. No pathologically enlarged lymph nodes. Reproductive: Uterus is visualized.  No adnexal mass. Other: Presacral thickening. No  pleural fluid. There are 2 midline ventral abdominal wall hernias, a smaller and more superiorly located hernia measures 4.1 x 5.8 cm and contains only fat, and a larger hernia that measures 8.4 x 17.8 cm and contains unobstructed colon. Musculoskeletal: No worrisome lytic or sclerotic lesions. Degenerative changes in the spine. IMPRESSION: 1. Right nephrostomy in place without hydronephrosis. No visible left renal parenchyma with a residual dilated collecting system in the left renal fossa. Patient reportedly has an ileal conduit. 2. Midline ventral abdominal wall hernias, the largest of which contains unobstructed colon. 3.  Aortic atherosclerosis (ICD10-170.0). Electronically Signed   By: Lorin Picket M.D.   On: 12/06/2016 08:21    Labs reviewed.       LOS: 4 days    Irine Seal 12/07/[id number] ID: Judy Kelley, female   DOB: May 13, 1965, 51 y.o.   MRN: [id number]

## 2016-12-07 NOTE — Progress Notes (Signed)
ANTICOAGULATION CONSULT NOTE - Follow Up Consult  Pharmacy Consult for heparin Indication: h/o DVT  Labs: Recent Labs    12/04/16 0611 12/05/16 0502 12/05/16 1749 12/06/16 0612 12/06/16 2305  HGB 7.1* 6.8*  --  7.6*  --   HCT 22.1* 21.4*  --  23.9*  --   PLT 226 336  --  290  --   APTT  --   --  40* 40* 39*  HEPARINUNFRC  --   --  >2.20* >2.20*  --   CREATININE 6.24* 7.95*  --  9.58*  --     Assessment: 51yo female remains subtherapeutic on heparin with lower PTT despite increased rate.  Goal of Therapy:  aPTT 66-102 seconds   Plan:  Will increase heparin gtt by 3-4 units/kg/hr to 1900 units/hr and check PTT in 8hr.  Wynona Neat, PharmD, BCPS  12/07/2016,12:29 AM

## 2016-12-07 NOTE — Progress Notes (Signed)
Pharmacy Antibiotic Note  Judy Kelley is a 51 y.o. female admitted on 12/03/2016 with bacteremia.  Pharmacy has been consulted for vancomycin dosing.  Patient is ESRD on HD MWF. Per written records patient brought from HD: blood cx drawn on 11/27 and catheter tip culture from 11/28 + for staph aureus.   Patient received 1.5g IV 11/27 with HD and 1g IV with HD on 11/30 (both doses PTA) Vancomycin random level upon admission was in therapeutic range at 33mcg/mL.  Last HD session was yesterday- tolerated 1.5h of BFR 358mL/min and 2.5h of BFR 357mL/min- slightly below full session.  Plan: Continue Vancomycin 1g IV qHD-MWF- goal pre-HD level 15-25 mcg/mL and post-HD leve 5-15 mcg/mL F/u HD schedule and tolerance  Follow TTE and repeat BCx for LOT plans- per ID note yesterday, if both negative plan is to treat for 2 weeks (would make last dose due on 12/11)  Height: 5\' 7"  (170.2 cm) Weight: 253 lb 15.5 oz (115.2 kg) IBW/kg (Calculated) : 61.6  Temp (24hrs), Avg:98 F (36.7 C), Min:97.6 F (36.4 C), Max:98.4 F (36.9 C)  Recent Labs  Lab 12/03/16 1818 12/03/16 1843 12/03/16 2138 12/04/16 0611 12/04/16 1113 12/05/16 0502 12/06/16 0612 12/07/16 0509  WBC 7.7  --   --  5.4  --  5.6 5.2 7.6  CREATININE 4.55*  --   --  6.24*  --  7.95* 9.58* 5.81*  LATICACIDVEN  --  2.77* 2.24*  --   --   --   --   --   VANCOTROUGH  --   --   --   --  19  --   --   --   VANCORANDOM  --   --   --  20  --   --   --   --     Estimated Creatinine Clearance: 14.8 mL/min (A) (by C-G formula based on SCr of 5.81 mg/dL (H)).    Allergies  Allergen Reactions  . Erythromycin Anaphylaxis  . Ciprofloxacin     IV only-burning to her veins, can take PO  . Chlorhexidine Rash  . Other Rash    chloraprep    Antimicrobials this admission: Vancomycin 11/27 (PTA) >> 12/1 VR = 20   Microbiology results: 12/1 CDiff: neg for toxin and antigen 12/1: UCx: neg 11/30: BCx: ngtd 11/30 MRSA: PCR positive  11/27  (OSH) BCx: MRSA  11/28 (OSH) cath tip culture: MRSA  Helvi Royals D. Dorinne Graeff, PharmD, BCPS Clinical Pharmacist Clinical Phone for 12/07/2016 until 3:30pm: [phone] If after 3:30pm, please call main pharmacy at [phone] 12/07/2016 11:27 AM

## 2016-12-08 ENCOUNTER — Other Ambulatory Visit: Payer: Self-pay | Admitting: Urology

## 2016-12-08 LAB — CBC
HEMATOCRIT: 27.2 % — AB (ref 36.0–46.0)
HEMOGLOBIN: 8.4 g/dL — AB (ref 12.0–15.0)
MCH: 31.2 pg (ref 26.0–34.0)
MCHC: 30.9 g/dL (ref 30.0–36.0)
MCV: 101.1 fL — ABNORMAL HIGH (ref 78.0–100.0)
Platelets: 351 10*3/uL (ref 150–400)
RBC: 2.69 MIL/uL — ABNORMAL LOW (ref 3.87–5.11)
RDW: 14.8 % (ref 11.5–15.5)
WBC: 7 10*3/uL (ref 4.0–10.5)

## 2016-12-08 LAB — BASIC METABOLIC PANEL
Anion gap: 14 (ref 5–15)
BUN: 9 mg/dL (ref 6–20)
CALCIUM: 8 mg/dL — AB (ref 8.9–10.3)
CO2: 28 mmol/L (ref 22–32)
Chloride: 95 mmol/L — ABNORMAL LOW (ref 101–111)
Creatinine, Ser: 4.47 mg/dL — ABNORMAL HIGH (ref 0.44–1.00)
GFR calc Af Amer: 12 mL/min — ABNORMAL LOW (ref >60)
GFR, EST NON AFRICAN AMERICAN: 10 mL/min — AB (ref >60)
Glucose, Bld: 84 mg/dL (ref 65–99)
POTASSIUM: 3.9 mmol/L (ref 3.5–5.1)
SODIUM: 137 mmol/L (ref 135–145)

## 2016-12-08 LAB — CULTURE, BLOOD (ROUTINE X 2)
CULTURE: NO GROWTH
Culture: NO GROWTH
SPECIAL REQUESTS: ADEQUATE
SPECIAL REQUESTS: ADEQUATE

## 2016-12-08 LAB — APTT: APTT: 77 s — AB (ref 24–36)

## 2016-12-08 LAB — HEPARIN LEVEL (UNFRACTIONATED): HEPARIN UNFRACTIONATED: 1.02 [IU]/mL — AB (ref 0.30–0.70)

## 2016-12-08 MED ORDER — VANCOMYCIN HCL IN DEXTROSE 1-5 GM/200ML-% IV SOLN
1000.0000 mg | Freq: Once | INTRAVENOUS | Status: AC
  Administered 2016-12-08: 1000 mg via INTRAVENOUS
  Filled 2016-12-08: qty 200

## 2016-12-08 MED ORDER — VANCOMYCIN HCL IN DEXTROSE 1-5 GM/200ML-% IV SOLN
1000.0000 mg | INTRAVENOUS | Status: DC
  Filled 2016-12-08 (×2): qty 200

## 2016-12-08 NOTE — Progress Notes (Addendum)
Addendum:  Heparin to be turned off in preparation for ileal conduit endoscopy 12/09/16 @ 0730 per urology.  Heparin infusion and associated labs have been discontinued. Pharmacy will follow for plans post-procedure  L.Ainara Eldridge, PharmD [phone]   [place] for heparin while apixaban on hold  Indication: hx of DVT  Allergies  Allergen Reactions  . Erythromycin Anaphylaxis  . Ciprofloxacin     IV only-burning to her veins, can take PO  . Chlorhexidine Rash  . Other Rash    chloraprep    Patient Measurements: Heparin Dosing Weight: 88 kg  Vital Signs: Temp: 97.9 F (36.6 C) (12/05 0542) BP: 102/56 (12/05 0542) Pulse Rate: 50 (12/05 0542)  Labs: Recent Labs    12/06/16 0612 12/06/16 2305 12/07/16 0509 12/07/16 0814 12/08/16 0734  HGB 7.6*  --  8.7*  --  8.4*  HCT 23.9*  --  27.1*  --  27.2*  PLT 290  --  343  --  351  APTT 40* 39*  --  79* 77*  HEPARINUNFRC >2.20*  --   --  1.48* 1.02*  CREATININE 9.58*  --  5.81*  --  4.47*    Assessment: 51 yo F on apixaban PTA for hx of DVT admitted 12/1 with MRSA bacteremia and concern for bleeding/hemorrhage from defunctionalized ileal conduit. Transitioning to heparin gtt while w/u ongoing. Last dose of apixaban 12/2AM.  Heparin level this morning still elevated at 1.02 units/mL (reflective of Eliquis) and aPTT in range at 77 seconds- not yet correlating, will continue to use aPTTs to adjust heparin rate.  Expected it will take some time for Eliquis to be fully removed given patient's ESRD status.  Hgb 8.4, plts 351- no bleeding noted.  Noted plans by urology to potentially take for endoscopy of conduit this week.   Goal of Therapy:  Heparin level 0.3-0.7 units/ml aPTT 66-102 seconds Monitor platelets by anticoagulation protocol: Yes   Plan:  Continue heparin at 1900 units/hr Daily heparin level and aPTT until correlating; daily CBC Follow switch back to oral agent when feasible    Volney Reierson D. Hutton Pellicane, PharmD, BCPS Clinical Pharmacist Clinical Phone for 12/08/2016 until 3:30pm: [phone] If after 3:30pm, please call main pharmacy at [phone] 12/08/2016 8:58 AM

## 2016-12-08 NOTE — Progress Notes (Addendum)
Patient ID: Judy Kelley, female   DOB: Apr 18, 1965, 51 y.o.   MRN: [id number]  I have [name] scheduled for endoscopy of her ileal conduit on Thursday morning at 730.  I have reviewed over the phone with [name] the risks of bleeding, infection, injury to the conduit and anesthetic complications.  I will make her n.p.o. after midnight.  I have stopped her Heparin pending the procedure.

## 2016-12-08 NOTE — Progress Notes (Signed)
Subjective:  Feeling well, trying to eat some breakfast.  For ileal conduit endoscopy tomorrow  Objective Vital signs in last 24 hours: Vitals:   12/07/16 1816 12/07/16 2133 12/08/16 0542 12/08/16 0859  BP: 111/60 (!) 98/47 (!) 102/56 (!) 102/45  Pulse: (!) 54 60 (!) 50 (!) 50  Resp: 13 18 15 19   Temp: 97.9 F (36.6 C) 98.2 F (36.8 C) 97.9 F (36.6 C) 98 F (36.7 C)  TempSrc: Oral   Oral  SpO2: 95% 93% 92% 93%  Weight: 113.6 kg (250 lb 7.1 oz) 113 kg (249 lb 1.9 oz)    Height:       Weight change: -1.4 kg (-1.4 oz)  Physical Exam: General: alert , pleasant Obese  ,OX3  Heart: RRR ,no m,r,g Lungs: CTA Abdomen: Obese , Bs +, soft < NT, ND , Left sided Nephrostomy tube site with  dried blood at site Extremities: Dialysis Access: Pos bruit RUA AVGG  Dialysis Orders: [facility] [place] 4 hr EDW 117.5 2 K 2 Ca AVGG heparin 5000 venofer 50 mircera 100 q 2 weeks - last dose 11/27 - calcitriol 1- no Fe right upper AVGG Recent labs: hgb 7.7 dropping 11% sat, no ferritin, no K ipTH 461   Problem/Plan: 1. MRSA bacteremia thought secondary to catheter sepsis (+ BC and perm cath exit drainage cultures 11/27). R IJ Perm cath removed 11/30/1/8  / On IV Vanco. ID seeing has  TTE yest prelim  No vegs  Seen  /repeat BC no growth so far.  Chest [place] without abscess in chest wall 2. ESRD- TTS no heparin systemically in dialysis.  Using RUE AVG without difficulty 3. Hypertension/volume- BP 96/51 asymp  this am  not on BP meds  - losing weight/ last wt 115.2    4. Anemia- hgb 7.1> 6.8> 7.6>8.7  12/2given. 1 u PRBCS /Fe def anemia - tsat 11% OP hold fe lod sec Bacteremia  -Mircera last dose100 11/30/16  - next would be scheduled 12/14/16 5. Metabolic bone disease- Continue calcitriol, sensipar 90, calcium acetate 6. Nutrition- alb 3.1 holding  nepro due to diarrhea - now eating  Better   7. 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 Bleeding from ileal conduit site/ abd wound- old ileal conduit site with blood drainage; also source for bacteria-Dr. Jeffie Pollock seeing rec holding Eliquis.  Endoscopy of old ileal conduit tomorrow 12/6. 9. Hx ovarianvs cervicalcancers/p prior chemo/radiation( In [place]. )but not surgery- "cancer free since 2000" no records available   Madelon Lips MD [place] pgr [phone] 12/08/2016,11:50 AM  LOS: 5 days   Labs: Basic Metabolic Panel: Recent Labs  Lab 12/06/16 0612 12/07/16 0509 12/08/16 0734  NA 136 136 137  K 3.5 3.8 3.9  CL 97* 98* 95*  CO2 26 26 28   GLUCOSE 82 85 84  BUN 41* 18 9  CREATININE 9.58* 5.81* 4.47*  CALCIUM 8.2* 8.2* 8.0*   Liver Function Tests: Recent Labs  Lab 12/03/16 1818  AST 41  ALT 21  ALKPHOS 137*  BILITOT 0.5  PROT 8.1  ALBUMIN 3.1*   CBC: Recent Labs  Lab 12/03/16 1818 12/04/16 0611 12/05/16 0502 12/06/16 0612 12/07/16 0509 12/08/16 0734  WBC 7.7 5.4 5.6 5.2 7.6 7.0  NEUTROABS 5.9  --   --   --   --   --   HGB 8.0* 7.1* 6.8* 7.6*  8.7* 8.4*  HCT 25.0* 22.1* 21.4* 23.9* 27.1* 27.2*  MCV 97.3 97.8 98.2 97.6 98.9 101.1*  PLT 314 226 336 290 343 351    Medications: . [START ON 12/09/2016] vancomycin    . vancomycin     . calcitRIOL  1 mcg Oral Q M,W,F-HD  . calcium acetate  1,334 mg Oral TID WC  . cinacalcet  90 mg Oral Q breakfast  . multivitamin  1 tablet Oral QHS  . mupirocin ointment  1 application Nasal BID  . nystatin   Topical TID  . sertraline  50 mg Oral Daily

## 2016-12-08 NOTE — Progress Notes (Signed)
PROGRESS NOTE    Judy Kelley  ELF:[id number] DOB: July 07, 1965 DOA: 12/03/2016 PCP: System, Pcp Not In     Brief Narrative:  Judy Kelley a 51 y.o.femalewithhistory of ESRD on hemodialysis, history of DVT, previous history of ovarian cancer in remission who had just recently moved from [place] was found to have redness around her right subclavian dialysis catheter 3 days prior to admission and had associated fever and chills. Blood cultures were obtained at dialysis center and patient was empirically started on vancomycin. Patient had received her second dose of vancomycin at dialysis center. Cultures came back as staph aureus and was instructed to come to the ER. She was found to have MRSA bacteremia and right subclavian dialysis catheter was removed. ID has been following. She also had bleeding from ileal conduit stoma for which urology has been consulted.   Assessment & Plan:   Principal Problem:   Bacteremia Active Problems:   ESRD on dialysis ([place])   Hypokalemia   History of ovarian cancer   MRSA bacteremia  MRSA bacteremia -Likely secondary to dialysis catheter (right subclavian) that was removed 11/30  -Initial blood cultures obtained 11/27 as an outpatient, per nephrology showed to be MRSA -Blood cultures 11/30 negative at 5 days  -Echocardiogram: EF 60-65%, grade 2DD, no mention of valvular involvement  -[place] chest: No evidence of abscess within the right anterior chest wall -Infectious disease consulted and appreciated- recommended 2 weeks of IV vanc with HD  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 difficile  negative -Improved   History of DVT -Was on Eliquis at home -Given her history of cancer and current state, she is at high risk for further clot burden -Continue heparin gtt pre-operatively   History of ovarian cancer -Currently in remission  Normocytic normochromic anemia/anemia of chronic renal disease -Hemoglobin dropped to 6.8, received 1uPRBC -Hemoglobin now stable   Dermatitis/rash -Appears to be more fungal vs pityriasis rosea (this would be self limited with only symptomatic treatment), only on flanks and B/L -Continue nystatin powder, sarna and continue to monitor   Morbid obesity  -BMI 40 -Discuss weight management with PCP on discharge   DVT prophylaxis: Eliquis --> Heparin gtt Code Status: Full Family Communication: No family at bedside Disposition Plan: pending    Consultants:   Nephrology  ID  Urology  Procedures:   None   Antimicrobials:  Anti-infectives (From admission, onward)   Start     Dose/Rate Route Frequency Ordered Stop   12/09/16 1200  vancomycin (VANCOCIN) IVPB 1000 mg/200 mL premix     1,000 mg 200 mL/hr over 60 Minutes Intravenous Every T-Th-Sa (Hemodialysis) 12/08/16 1136     12/08/16 1230  vancomycin (VANCOCIN) IVPB 1000 mg/200 mL premix     1,000 mg 200 mL/hr over 60 Minutes Intravenous  Once 12/08/16 1136 12/08/16 1353   12/06/16 1736  vancomycin (VANCOCIN) 1-5 GM/200ML-% IVPB    Comments:  Cherylann Banas   : cabinet override      12/06/16 1736 12/07/16 0544   12/06/16 1200  vancomycin (VANCOCIN) IVPB 1000 mg/200 mL premix  Status:  Discontinued     1,000 mg 200 mL/hr over 60 Minutes Intravenous Every M-W-F (Hemodialysis) 12/04/16 1708 12/08/16  1136       Subjective: Doing well this morning, no new complaints. Denies fevers, chills, chest pain, shortness of breath, abdominal pain.  Had some nausea and vomiting which has improved this morning.  Objective: Vitals:   12/07/16 1816 12/07/16 2133 12/08/16 0542 12/08/16  0859  BP: 111/60 (!) 98/47 (!) 102/56 (!) 102/45  Pulse: (!) 54 60 (!) 50 (!) 50  Resp: 13 18 15 19   Temp: 97.9 F (36.6 C) 98.2 F (36.8 C) 97.9 F (36.6 C) 98 F (36.7 C)  TempSrc: Oral   Oral  SpO2: 95% 93% 92% 93%  Weight: 113.6 kg (250 lb 7.1 oz) 113 kg (249 lb 1.9 oz)    Height:        Intake/Output Summary (Last 24 hours) at 12/08/2016 1534 Last data filed at 12/08/2016 1300 Gross per 24 hour  Intake 695.48 ml  Output 1150 ml  Net -454.52 ml   Filed Weights   12/07/16 1410 12/07/16 1816 12/07/16 2133  Weight: 114.8 kg (253 lb 1.4 oz) 113.6 kg (250 lb 7.1 oz) 113 kg (249 lb 1.9 oz)    Examination:  General exam: Appears calm and comfortable  Respiratory system: Clear to auscultation. Respiratory effort normal. Cardiovascular system: S1 & S2 heard, RRR. No JVD, murmurs, rubs, gallops or clicks. No pedal edema. Gastrointestinal system: Abdomen is nondistended, soft and nontender. No organomegaly or masses felt. Normal bowel sounds heard. Central nervous system: Alert and oriented. No focal neurological deficits. Extremities: Symmetric 5 x 5 power. Skin: No rashes, lesions or ulcers, +ileal conduit stoma with minimal bleeding on dressing  Psychiatry: Judgement and insight appear normal. Mood & affect appropriate.   Data Reviewed: I have personally reviewed following labs and imaging studies  CBC: Recent Labs  Lab 12/03/16 1818 12/04/16 0611 12/05/16 0502 12/06/16 0612 12/07/16 0509 12/08/16 0734  WBC 7.7 5.4 5.6 5.2 7.6 7.0  NEUTROABS 5.9  --   --   --   --   --   HGB 8.0* 7.1* 6.8* 7.6* 8.7* 8.4*  HCT 25.0* 22.1* 21.4* 23.9* 27.1* 27.2*  MCV 97.3 97.8 98.2 97.6 98.9 101.1*  PLT 314 226 336 290 343 573   Basic Metabolic Panel: Recent Labs  Lab 12/04/16 0611 12/05/16 0502 12/06/16 0612 12/07/16 0509 12/08/16 0734  NA 138 137 136 136 137  K 3.0* 3.8 3.5 3.8 3.9  CL 96* 97* 97* 98* 95*  CO2 28 27 26 26 28   GLUCOSE 94 79 82 85 84  BUN 20 32* 41* 18 9    CREATININE 6.24* 7.95* 9.58* 5.81* 4.47*  CALCIUM 8.2* 8.7* 8.2* 8.2* 8.0*  MG  --  2.3  --   --   --    GFR: Estimated Creatinine Clearance: 19.1 mL/min (A) (by C-G formula based on SCr of 4.47 mg/dL (H)). Liver Function Tests: Recent Labs  Lab 12/03/16 1818  AST 41  ALT 21  ALKPHOS 137*  BILITOT 0.5  PROT 8.1  ALBUMIN 3.1*   No results for input(s): LIPASE, AMYLASE in the last 168 hours. No results for input(s): AMMONIA in the last 168 hours. Coagulation Profile: Recent Labs  Lab 12/03/16 1818  INR 1.14   Cardiac Enzymes: No results for input(s): CKTOTAL, CKMB, CKMBINDEX, TROPONINI in the last 168 hours. BNP (last 3 results) No results for input(s): PROBNP in the last 8760 hours. HbA1C: No results for input(s): HGBA1C in the last 72 hours. CBG: No results for input(s): GLUCAP in the last 168 hours.  Lipid Profile: No results for input(s): CHOL, HDL, LDLCALC, TRIG, CHOLHDL, LDLDIRECT in the last 72 hours. Thyroid Function Tests: No results for input(s): TSH, T4TOTAL, FREET4, T3FREE, THYROIDAB in the last 72 hours. Anemia Panel: No results for input(s): VITAMINB12, FOLATE, FERRITIN, TIBC, IRON, RETICCTPCT in the last 72 hours. Sepsis Labs: Recent Labs  Lab 12/03/16 1843 12/03/16 2138  LATICACIDVEN 2.77* 2.24*    Recent Results (from the past 240 hour(s))  Culture, blood (Routine x 2)     Status: None   Collection Time: 12/03/16  6:20 PM  Result Value Ref Range Status   Specimen Description BLOOD LEFT HAND  Final   Special Requests   Final    Blood Culture adequate volume BOTTLES DRAWN AEROBIC AND ANAEROBIC   Culture NO GROWTH 5 DAYS  Final   Report Status 12/08/2016 FINAL  Final  Culture, blood (Routine x 2)     Status: None   Collection Time: 12/03/16  9:28 PM  Result Value Ref Range Status   Specimen Description BLOOD LEFT ANTECUBITAL  Final   Special Requests   Final    Blood Culture adequate volume BOTTLES DRAWN AEROBIC ONLY   Culture NO GROWTH 5  DAYS  Final   Report Status 12/08/2016 FINAL  Final  MRSA PCR Screening     Status: Abnormal   Collection Time: 12/04/16  1:51 AM  Result Value Ref Range Status   MRSA by PCR POSITIVE (A) NEGATIVE Final    Comment:        The GeneXpert MRSA Assay (FDA approved for NASAL specimens only), is one component of a comprehensive MRSA colonization surveillance program. It is not intended to diagnose MRSA infection nor to guide or monitor treatment for MRSA infections. RESULT CALLED TO, READ BACK BY AND VERIFIED WITH: Fulton Reek RN [phone] 12/04/16 A BROWNING   C difficile quick scan w PCR reflex     Status: None   Collection Time: 12/04/16 10:45 AM  Result Value Ref Range Status   C Diff antigen NEGATIVE NEGATIVE Final   C Diff toxin NEGATIVE NEGATIVE Final   C Diff interpretation No C. difficile detected.  Final  Culture, Urine     Status: None   Collection Time: 12/04/16 10:46 AM  Result Value Ref Range Status   Specimen Description URINE, RANDOM  Final   Special Requests NONE  Final   Culture NO GROWTH  Final   Report Status 12/05/2016 FINAL  Final       Radiology Studies: [place] Chest Soft Tissue  Result Date: 12/08/2016 CLINICAL DATA:  Post removal of right chest wall dialysis catheter. EXAM: ULTRASOUND OF HEAD/NECK SOFT TISSUES TECHNIQUE: Ultrasound examination of the head and neck soft tissues was performed in the area of clinical concern. COMPARISON:  None. FINDINGS: Static grayscale images of the area of concern in the right anterior chest wall demonstrate diffuse soft tissue swelling and a tract from prior dialysis catheter removal. No localized fluid collection is seen. IMPRESSION: No evidence of abscess within the right anterior chest wall. Electronically Signed   By: Fidela Salisbury M.D.   On: 12/08/2016 03:21      Scheduled Meds: . calcitRIOL  1 mcg Oral Q M,W,F-HD  . calcium acetate  1,334 mg Oral TID WC  . cinacalcet  90 mg Oral Q breakfast  . multivitamin  1 tablet  Oral QHS  . mupirocin ointment  1 application Nasal BID  . nystatin   Topical TID  . sertraline  50 mg Oral  Daily   Continuous Infusions: . [START ON 12/09/2016] vancomycin       LOS: 5 days    Time spent: 40 minutes   Dessa Phi, DO [facility] [email or web address] Password [place] 12/08/2016, 3:34 PM

## 2016-12-08 NOTE — Progress Notes (Signed)
INFECTIOUS DISEASE PROGRESS NOTE  ID: Judy Kelley is a 51 y.o. female with  Principal Problem:   Bacteremia Active Problems:   ESRD on dialysis ([place])   Hypokalemia   History of ovarian cancer   MRSA bacteremia  Subjective: Feeling better today. No new complaints.   Abtx:  Anti-infectives (From admission, onward)   Start     Dose/Rate Route Frequency Ordered Stop   12/06/16 1736  vancomycin (VANCOCIN) 1-5 GM/200ML-% IVPB    Comments:  Cherylann Banas   : cabinet override      12/06/16 1736 12/07/16 0544   12/06/16 1200  vancomycin (VANCOCIN) IVPB 1000 mg/200 mL premix     1,000 mg 200 mL/hr over 60 Minutes Intravenous Every M-W-F (Hemodialysis) 12/04/16 1708        Medications:  Scheduled: . calcitRIOL  1 mcg Oral Q M,W,F-HD  . calcium acetate  1,334 mg Oral TID WC  . cinacalcet  90 mg Oral Q breakfast  . multivitamin  1 tablet Oral QHS  . mupirocin ointment  1 application Nasal BID  . nystatin   Topical TID  . sertraline  50 mg Oral Daily    Objective: Vital signs in last 24 hours: Temp:  [97.2 F (36.2 C)-98.2 F (36.8 C)] 98 F (36.7 C) (12/05 0859) Pulse Rate:  [49-60] 50 (12/05 0859) Resp:  [12-19] 19 (12/05 0859) BP: (93-111)/(45-63) 102/45 (12/05 0859) SpO2:  [92 %-95 %] 93 % (12/05 0859) Weight:  [249 lb 1.9 oz (113 kg)-253 lb 1.4 oz (114.8 kg)] 249 lb 1.9 oz (113 kg) (12/04 2133)   General appearance: alert, cooperative and no distress Resp: clear to auscultation bilaterally Chest wall: no tenderness, right chest with old line site. No drainage. Some induration remains. No pain or erythema.  Cardio: regular rate and rhythm and S1, S2 normal GI: normal findings: bowel sounds normal and soft, non-tender and Open area to lower abdomen with some bloody drainage from previous conduit site. [place].  Extremities: edema trace BLE  RUE AVG with +bruit +thrill. Some blood drainage from yesterday's access   Lab Results Recent Labs    12/07/16 0509  12/08/16 0734  WBC 7.6 7.0  HGB 8.7* 8.4*  HCT 27.1* 27.2*  NA 136 137  K 3.8 3.9  CL 98* 95*  CO2 26 28  BUN 18 9  CREATININE 5.81* 4.47*   Liver Panel No results for input(s): PROT, ALBUMIN, AST, ALT, ALKPHOS, BILITOT, BILIDIR, IBILI in the last 72 hours. Sedimentation Rate No results for input(s): ESRSEDRATE in the last 72 hours. C-Reactive Protein No results for input(s): CRP in the last 72 hours.  Microbiology: Recent Results (from the past 240 hour(s))  Culture, blood (Routine x 2)     Status: None (Preliminary result)   Collection Time: 12/03/16  6:20 PM  Result Value Ref Range Status   Specimen Description BLOOD LEFT HAND  Final   Special Requests   Final    Blood Culture adequate volume BOTTLES DRAWN AEROBIC AND ANAEROBIC   Culture NO GROWTH 4 DAYS  Final   Report Status PENDING  Incomplete  Culture, blood (Routine x 2)     Status: None (Preliminary result)   Collection Time: 12/03/16  9:28 PM  Result Value Ref Range Status   Specimen Description BLOOD LEFT ANTECUBITAL  Final   Special Requests   Final    Blood Culture adequate volume BOTTLES DRAWN AEROBIC ONLY   Culture NO GROWTH 4 DAYS  Final   Report Status  PENDING  Incomplete  MRSA PCR Screening     Status: Abnormal   Collection Time: 12/04/16  1:51 AM  Result Value Ref Range Status   MRSA by PCR POSITIVE (A) NEGATIVE Final    Comment:        The GeneXpert MRSA Assay (FDA approved for NASAL specimens only), is one component of a comprehensive MRSA colonization surveillance program. It is not intended to diagnose MRSA infection nor to guide or monitor treatment for MRSA infections. RESULT CALLED TO, READ BACK BY AND VERIFIED WITH: Fulton Reek RN [phone] 12/04/16 A BROWNING   C difficile quick scan w PCR reflex     Status: None   Collection Time: 12/04/16 10:45 AM  Result Value Ref Range Status   C Diff antigen NEGATIVE NEGATIVE Final   C Diff toxin NEGATIVE NEGATIVE Final   C Diff interpretation No C.  difficile detected.  Final  Culture, Urine     Status: None   Collection Time: 12/04/16 10:46 AM  Result Value Ref Range Status   Specimen Description URINE, RANDOM  Final   Special Requests NONE  Final   Culture NO GROWTH  Final   Report Status 12/05/2016 FINAL  Final    Studies/Results: [place] Chest Soft Tissue  Result Date: 12/08/2016 CLINICAL DATA:  Post removal of right chest wall dialysis catheter. EXAM: ULTRASOUND OF HEAD/NECK SOFT TISSUES TECHNIQUE: Ultrasound examination of the head and neck soft tissues was performed in the area of clinical concern. COMPARISON:  None. FINDINGS: Static grayscale images of the area of concern in the right anterior chest wall demonstrate diffuse soft tissue swelling and a tract from prior dialysis catheter removal. No localized fluid collection is seen. IMPRESSION: No evidence of abscess within the right anterior chest wall. Electronically Signed   By: Fidela Salisbury M.D.   On: 12/08/2016 03:21    Assessment: MRSA bacteremia = - 11-27 BCx [facility] [phone]) - 2/2 (S- vanco/tet/clinda /gent/rifampin/linezolid/tygacil) - BCx 11/30 after line removed NG x 5d  - TTE 12/3 no mention of vegetations  HD line infection --> R IJ perm cath removed 11/30 - U/S of chest wall 12/5 - no evidence of abscess from previous cath site   ESRD =  - AVF present with +bruit/+thrill   Abdominal Wound = - upcoming endoscopy of old ileal conduit site on 12/6 ([place])  DVT = - on Eliquis --> currently held with upcoming procedure   Previous Ovarian CA = - S/P chemo/radiation in [place] "cancer free since 2000"    Plan: Total days of antibiotics: 5 vanco  -Continue Vancomycin x 14d from 11/30  - Could consider TEE during endoscopy procedure tomorrow --> however she has not had persistently positive BCx and likely nidus has been removed. Will discuss with Dr. Johnnye Sima.  - HD through AVG now - no apparent need for re-insertion of line.      Janene Madeira, MSN, NP-C [facility] [place] Cell: [phone] Pager: [phone]  12/08/2016  10:58 AM

## 2016-12-08 NOTE — Anesthesia Preprocedure Evaluation (Addendum)
Anesthesia Evaluation  Patient identified by MRN, date of birth, ID band Patient awake    Reviewed: Allergy & Precautions, H&P , Patient's Chart, lab work & pertinent test results  Airway Mallampati: II  TM Distance: >3 FB Neck ROM: full    Dental no notable dental hx. (+) Poor Dentition, Chipped, Dental Advisory Given   Pulmonary    Pulmonary exam normal breath sounds clear to auscultation       Cardiovascular Exercise Tolerance: Good  Rhythm:regular Rate:Normal     Neuro/Psych    GI/Hepatic   Endo/Other  Morbid obesity  Renal/GU ESRF and DialysisRenal disease     Musculoskeletal   Abdominal   Peds  Hematology  (+) anemia ,   Anesthesia Other Findings   Reproductive/Obstetrics                            Anesthesia Physical Anesthesia Plan  ASA: III  Anesthesia Plan: MAC   Post-op Pain Management:    Induction: Intravenous  PONV Risk Score and Plan:   Airway Management Planned: Mask and Natural Airway  Additional Equipment:   Intra-op Plan:   Post-operative Plan:   Informed Consent: I have reviewed the patients History and Physical, chart, labs and discussed the procedure including the risks, benefits and alternatives for the proposed anesthesia with the patient or authorized representative who has indicated his/her understanding and acceptance.     Plan Discussed with:   Anesthesia Plan Comments:        Anesthesia Quick Evaluation

## 2016-12-08 NOTE — H&P (View-Only) (Signed)
Patient ID: Judy Kelley, female   DOB: [birth year], 51 y.o.   MRN: [id number]  I have Judy Kelley scheduled for endoscopy of her ileal conduit on Thursday morning at 730.  I have reviewed over the phone with Judy Kelley the risks of bleeding, infection, injury to the conduit and anesthetic complications.  I will make her n.p.o. after midnight.  I have stopped her Heparin pending the procedure.

## 2016-12-08 NOTE — Progress Notes (Addendum)
Pharmacy Antibiotic Note  Judy Kelley is a 51 y.o. female admitted on 12/03/2016 with bacteremia.  Pharmacy has been consulted for vancomycin dosing.  Patient is ESRD on HD TTSat. Per written records patient brought from HD: blood cx drawn on 11/27 and catheter tip culture from 11/28 + for staph aureus.   Patient received 1.5g IV 11/27 with HD and 1g IV with HD PTA. Vancomycin random level upon admission was in therapeutic range at 46mcg/mL.  Went to HD on 12/3 and then again yesterday to get back on schedule- had slightly below full session on 12/3, but tolerated full 4h @ BFR 491mL/min on 12/4.   Plan: Vancomycin 1g IV x1 as dose not given yesterday after HD Continue Vancomycin 1g IV qHD-TTSat- goal pre-HD level 15-25 mcg/mL and post-HD leve 5-15 mcg/mL F/u HD schedule and tolerance  Follow TTE and repeat BCx for LOT plans- per ID note yesterday, if both negative plan is to treat for 2 weeks  Height: 5\' 7"  (170.2 cm) Weight: 249 lb 1.9 oz (113 kg) IBW/kg (Calculated) : 61.6  Temp (24hrs), Avg:97.8 F (36.6 C), Min:97.2 F (36.2 C), Max:98.2 F (36.8 C)  Recent Labs  Lab 12/03/16 1843 12/03/16 2138 12/04/16 0611 12/04/16 1113 12/05/16 0502 12/06/16 0612 12/07/16 0509 12/08/16 0734  WBC  --   --  5.4  --  5.6 5.2 7.6 7.0  CREATININE  --   --  6.24*  --  7.95* 9.58* 5.81* 4.47*  LATICACIDVEN 2.77* 2.24*  --   --   --   --   --   --   VANCOTROUGH  --   --   --  19  --   --   --   --   VANCORANDOM  --   --  20  --   --   --   --   --     Estimated Creatinine Clearance: 19.1 mL/min (A) (by C-G formula based on SCr of 4.47 mg/dL (H)).    Allergies  Allergen Reactions  . Erythromycin Anaphylaxis  . Ciprofloxacin     IV only-burning to her veins, can take PO  . Chlorhexidine Rash  . Other Rash    chloraprep    Antimicrobials this admission: Vancomycin 11/27 (PTA) >> 12/1 VR = 20   Microbiology results: 12/1 CDiff: neg for toxin and antigen 12/1: UCx: neg 11/30:  BCx: ngtd 11/30 MRSA: PCR positive  11/27 (OSH) BCx: MRSA  11/28 (OSH) cath tip culture: MRSA  Judy Kelley D. Destiney Sanabia, PharmD, [place] Clinical Pharmacist Clinical Phone for 12/08/2016 until 3:30pm: [phone] If after 3:30pm, please call main pharmacy at [phone] 12/08/2016 11:33 AM

## 2016-12-09 ENCOUNTER — Encounter (HOSPITAL_COMMUNITY)
Admission: EM | Disposition: A | Discharge status: Home / Self Care | Payer: Self-pay | Source: Home / Self Care | Attending: Internal Medicine

## 2016-12-09 ENCOUNTER — Inpatient Hospital Stay (HOSPITAL_COMMUNITY): Payer: Medicare Other | Admitting: Certified Registered Nurse Anesthetist

## 2016-12-09 ENCOUNTER — Encounter (HOSPITAL_COMMUNITY): Payer: Self-pay | Admitting: Certified Registered Nurse Anesthetist

## 2016-12-09 ENCOUNTER — Inpatient Hospital Stay (HOSPITAL_COMMUNITY): Payer: Medicare Other

## 2016-12-09 HISTORY — PX: IR FLUORO GUIDE CV LINE RIGHT: IMG2283

## 2016-12-09 HISTORY — PX: CYSTOSCOPY: SHX5120

## 2016-12-09 HISTORY — PX: IR THROMBECTOMY AV FISTULA W/THROMBOLYSIS/PTA/STENT INC/SHUNT/IMG RT: IMG6120

## 2016-12-09 HISTORY — PX: IR US GUIDE VASC ACCESS RIGHT: IMG2390

## 2016-12-09 LAB — CBC
HCT: 27.8 % — ABNORMAL LOW (ref 36.0–46.0)
HEMOGLOBIN: 8.5 g/dL — AB (ref 12.0–15.0)
MCH: 30.6 pg (ref 26.0–34.0)
MCHC: 30.6 g/dL (ref 30.0–36.0)
MCV: 100 fL (ref 78.0–100.0)
Platelets: 334 10*3/uL (ref 150–400)
RBC: 2.78 MIL/uL — AB (ref 3.87–5.11)
RDW: 14.3 % (ref 11.5–15.5)
WBC: 7 10*3/uL (ref 4.0–10.5)

## 2016-12-09 LAB — BASIC METABOLIC PANEL
ANION GAP: 11 (ref 5–15)
BUN: 18 mg/dL (ref 6–20)
CHLORIDE: 98 mmol/L — AB (ref 101–111)
CO2: 28 mmol/L (ref 22–32)
Calcium: 8.6 mg/dL — ABNORMAL LOW (ref 8.9–10.3)
Creatinine, Ser: 6.74 mg/dL — ABNORMAL HIGH (ref 0.44–1.00)
GFR calc Af Amer: 7 mL/min — ABNORMAL LOW (ref >60)
GFR, EST NON AFRICAN AMERICAN: 6 mL/min — AB (ref >60)
Glucose, Bld: 99 mg/dL (ref 65–99)
POTASSIUM: 3.7 mmol/L (ref 3.5–5.1)
SODIUM: 137 mmol/L (ref 135–145)

## 2016-12-09 SURGERY — CYSTOSCOPY
Anesthesia: Monitor Anesthesia Care | Site: Abdomen

## 2016-12-09 MED ORDER — FENTANYL CITRATE (PF) 100 MCG/2ML IJ SOLN
25.0000 ug | INTRAMUSCULAR | Status: DC | PRN
  Administered 2016-12-09 (×2): 25 ug via INTRAVENOUS

## 2016-12-09 MED ORDER — FENTANYL CITRATE (PF) 100 MCG/2ML IJ SOLN
INTRAMUSCULAR | Status: AC
  Administered 2016-12-09: 25 ug via INTRAVENOUS
  Filled 2016-12-09: qty 2

## 2016-12-09 MED ORDER — IOPAMIDOL (ISOVUE-300) INJECTION 61%
INTRAVENOUS | Status: AC
  Filled 2016-12-09: qty 50

## 2016-12-09 MED ORDER — PROPOFOL 10 MG/ML IV BOLUS
INTRAVENOUS | Status: AC
  Filled 2016-12-09: qty 20

## 2016-12-09 MED ORDER — VANCOMYCIN HCL IN DEXTROSE 1-5 GM/200ML-% IV SOLN
1000.0000 mg | INTRAVENOUS | Status: DC
Start: 2016-12-11 — End: 2016-12-14
  Administered 2016-12-14: 1000 mg via INTRAVENOUS
  Filled 2016-12-09 (×3): qty 200

## 2016-12-09 MED ORDER — CEFAZOLIN SODIUM-DEXTROSE 2-4 GM/100ML-% IV SOLN
INTRAVENOUS | Status: AC
  Administered 2016-12-09: 2000 mg
  Filled 2016-12-09: qty 100

## 2016-12-09 MED ORDER — VANCOMYCIN HCL IN DEXTROSE 1-5 GM/200ML-% IV SOLN
1000.0000 mg | INTRAVENOUS | Status: AC
  Administered 2016-12-10: 1000 mg via INTRAVENOUS
  Filled 2016-12-09: qty 200

## 2016-12-09 MED ORDER — HEPARIN SODIUM (PORCINE) 1000 UNIT/ML IJ SOLN
INTRAMUSCULAR | Status: AC
  Filled 2016-12-09: qty 1

## 2016-12-09 MED ORDER — ALTEPLASE 2 MG IJ SOLR
INTRAMUSCULAR | Status: AC | PRN
  Administered 2016-12-09: 3 mg

## 2016-12-09 MED ORDER — ONDANSETRON HCL 4 MG/2ML IJ SOLN
INTRAMUSCULAR | Status: DC | PRN
Start: 2016-12-09 — End: 2016-12-09
  Administered 2016-12-09: 4 mg via INTRAVENOUS

## 2016-12-09 MED ORDER — DARBEPOETIN ALFA 200 MCG/0.4ML IJ SOSY
200.0000 ug | PREFILLED_SYRINGE | INTRAMUSCULAR | Status: DC
Start: 2016-12-14 — End: 2016-12-14
  Administered 2016-12-14: 200 ug via INTRAVENOUS
  Filled 2016-12-09: qty 0.4

## 2016-12-09 MED ORDER — MIDAZOLAM HCL 2 MG/2ML IJ SOLN
INTRAMUSCULAR | Status: AC | PRN
  Administered 2016-12-09 (×5): 1 mg via INTRAVENOUS

## 2016-12-09 MED ORDER — MIDAZOLAM HCL 5 MG/5ML IJ SOLN
INTRAMUSCULAR | Status: DC | PRN
  Administered 2016-12-09 (×2): 1 mg via INTRAVENOUS

## 2016-12-09 MED ORDER — IOPAMIDOL (ISOVUE-300) INJECTION 61%
INTRAVENOUS | Status: AC
  Filled 2016-12-09: qty 100

## 2016-12-09 MED ORDER — FENTANYL CITRATE (PF) 100 MCG/2ML IJ SOLN
INTRAMUSCULAR | Status: AC | PRN
  Administered 2016-12-09 (×6): 50 ug via INTRAVENOUS
  Administered 2016-12-09: 25 ug via INTRAVENOUS

## 2016-12-09 MED ORDER — PROPOFOL 500 MG/50ML IV EMUL
INTRAVENOUS | Status: DC | PRN
  Administered 2016-12-09: 100 ug/kg/min via INTRAVENOUS

## 2016-12-09 MED ORDER — LIDOCAINE HCL 1 % IJ SOLN
INTRAMUSCULAR | Status: AC
  Filled 2016-12-09: qty 20

## 2016-12-09 MED ORDER — FENTANYL CITRATE (PF) 100 MCG/2ML IJ SOLN
INTRAMUSCULAR | Status: AC
  Filled 2016-12-09: qty 2

## 2016-12-09 MED ORDER — MIDAZOLAM HCL 2 MG/2ML IJ SOLN
INTRAMUSCULAR | Status: AC
  Filled 2016-12-09: qty 2

## 2016-12-09 MED ORDER — FENTANYL CITRATE (PF) 250 MCG/5ML IJ SOLN
INTRAMUSCULAR | Status: AC
  Filled 2016-12-09: qty 5

## 2016-12-09 MED ORDER — LIDOCAINE HCL 1 % IJ SOLN
INTRAMUSCULAR | Status: AC | PRN
  Administered 2016-12-09: 10 mL
  Administered 2016-12-09: 15 mL

## 2016-12-09 MED ORDER — IOPAMIDOL (ISOVUE-300) INJECTION 61%
INTRAVENOUS | Status: AC
  Administered 2016-12-09: 100 mL via INTRAVENOUS
  Filled 2016-12-09: qty 50

## 2016-12-09 MED ORDER — CEFAZOLIN SODIUM-DEXTROSE 2-4 GM/100ML-% IV SOLN
2.0000 g | INTRAVENOUS | Status: AC
  Filled 2016-12-09: qty 100

## 2016-12-09 MED ORDER — ALTEPLASE 2 MG IJ SOLR
INTRAMUSCULAR | Status: AC
  Filled 2016-12-09: qty 4

## 2016-12-09 MED ORDER — LIDOCAINE 2% (20 MG/ML) 5 ML SYRINGE
INTRAMUSCULAR | Status: AC
  Filled 2016-12-09: qty 5

## 2016-12-09 MED ORDER — ONDANSETRON HCL 4 MG/2ML IJ SOLN
INTRAMUSCULAR | Status: AC
  Filled 2016-12-09: qty 2

## 2016-12-09 MED ORDER — MIDAZOLAM HCL 2 MG/2ML IJ SOLN
INTRAMUSCULAR | Status: AC
  Filled 2016-12-09: qty 4

## 2016-12-09 MED ORDER — CEFAZOLIN SODIUM-DEXTROSE 2-3 GM-%(50ML) IV SOLR
INTRAVENOUS | Status: DC | PRN
  Administered 2016-12-09: 2 g via INTRAVENOUS

## 2016-12-09 MED ORDER — FENTANYL CITRATE (PF) 100 MCG/2ML IJ SOLN
INTRAMUSCULAR | Status: DC | PRN
  Administered 2016-12-09: 50 ug via INTRAVENOUS

## 2016-12-09 MED ORDER — FENTANYL CITRATE (PF) 100 MCG/2ML IJ SOLN
INTRAMUSCULAR | Status: AC
  Filled 2016-12-09: qty 4

## 2016-12-09 MED ORDER — CEFAZOLIN SODIUM 1 G IJ SOLR
INTRAMUSCULAR | Status: AC
  Filled 2016-12-09: qty 20

## 2016-12-09 MED ORDER — SODIUM CHLORIDE 0.9 % IV SOLN
INTRAVENOUS | Status: DC | PRN
  Administered 2016-12-09: 07:00:00 via INTRAVENOUS

## 2016-12-09 MED ORDER — STERILE WATER FOR IRRIGATION IR SOLN
Status: DC | PRN
  Administered 2016-12-09 (×2): 3000 mL

## 2016-12-09 SURGICAL SUPPLY — 19 items
BAG URO CATCHER STRL LF (MISCELLANEOUS) ×3 IMPLANT
BLADE 10 SAFETY STRL DISP (BLADE) ×3 IMPLANT
BUCKET BIOHAZARD WASTE 5 GAL (MISCELLANEOUS) ×3 IMPLANT
CATH ROBINSON RED A/P 12FR (CATHETERS) ×3 IMPLANT
DRAPE CAMERA CLOSED 9X96 (DRAPES) ×3 IMPLANT
DRAPE LAPAROTOMY 100X72 PEDS (DRAPES) ×3 IMPLANT
GAUZE SPONGE 4X4 16PLY XRAY LF (GAUZE/BANDAGES/DRESSINGS) ×3 IMPLANT
GLOVE SURG SS PI 8.0 STRL IVOR (GLOVE) ×3 IMPLANT
GOWN STRL REUS W/ TWL XL LVL3 (GOWN DISPOSABLE) ×1 IMPLANT
GOWN STRL REUS W/TWL XL LVL3 (GOWN DISPOSABLE) ×2
GUIDEWIRE STR DUAL SENSOR (WIRE) ×3 IMPLANT
H R LUBE JELLY XXX (MISCELLANEOUS) ×3 IMPLANT
KIT ROOM TURNOVER OR (KITS) ×3 IMPLANT
PACK CYSTO (CUSTOM PROCEDURE TRAY) ×3 IMPLANT
PAD ABD 8X10 STRL (GAUZE/BANDAGES/DRESSINGS) ×3 IMPLANT
PAD ARMBOARD 7.5X6 YLW CONV (MISCELLANEOUS) ×6 IMPLANT
TUBE CONNECTING 12'X1/4 (SUCTIONS) ×1
TUBE CONNECTING 12X1/4 (SUCTIONS) ×2 IMPLANT
UNDERPAD 30X30 (UNDERPADS AND DIAPERS) ×3 IMPLANT

## 2016-12-09 NOTE — Sedation Documentation (Signed)
Patient is resting comfortably. 

## 2016-12-09 NOTE — Progress Notes (Signed)
PT Cancellation Note  Patient Details Name: Judy Kelley MRN: [id number] DOB: [birth year]   Cancelled Treatment:    Reason Eval/Treat Not Completed: Patient at procedure or test/unavailable Pt currently down in Interventional radiology. Will check back as schedule allows.   Leighton Ruff, PT, DPT  Acute Rehabilitation Services  Pager: [phone]    Rudean Hitt 12/09/2016, 2:24 PM

## 2016-12-09 NOTE — Sedation Documentation (Signed)
Pt complains of pain at procedure site

## 2016-12-09 NOTE — Progress Notes (Signed)
Pharmacy received consult for OPAT with HD.  Renal service coordinates IV antibiotics with outpatient HD center.  ID will need to indicate last day of therapy desired (from notes, appears plan is 2 weeks from 11/30 which makes last day of treatment 12/13 with HD since that will cover until HD on 12/15).  Patient qualifies for vancomycin 1g IV qHD based on weight and the fact she is tolerating full HD sessions.  Will d/c OPAT consult.  Meelah Tallo D. Pallavi Clifton, PharmD, BCPS Clinical Pharmacist Clinical Phone for 12/09/2016 until 3:30pm: [phone] If after 3:30pm, please call main pharmacy at [phone] 12/09/2016 12:25 PM

## 2016-12-09 NOTE — Sedation Documentation (Signed)
Pt is stable at this time with no complaints. Will proceed at this time with tunneled HD catheter insertion, mD unable to open graft on right arm

## 2016-12-09 NOTE — Sedation Documentation (Signed)
Patient is resting

## 2016-12-09 NOTE — Sedation Documentation (Signed)
Pt resting comfortably at this time. No complaints, procedure started.

## 2016-12-09 NOTE — Progress Notes (Signed)
INFECTIOUS DISEASE PROGRESS NOTE  ID: Judy Kelley is a 51 y.o. female with  Principal Problem:   Bacteremia Active Problems:   ESRD on dialysis ([place])   Hypokalemia   History of ovarian cancer   MRSA bacteremia  Subjective: Feeling well. No new complaints.   Abtx:  Anti-infectives (From admission, onward)   Start     Dose/Rate Route Frequency Ordered Stop   12/09/16 1200  vancomycin (VANCOCIN) IVPB 1000 mg/200 mL premix     1,000 mg 200 mL/hr over 60 Minutes Intravenous Every T-Th-Sa (Hemodialysis) 12/08/16 1136     12/08/16 1230  vancomycin (VANCOCIN) IVPB 1000 mg/200 mL premix     1,000 mg 200 mL/hr over 60 Minutes Intravenous  Once 12/08/16 1136 12/08/16 1353   12/06/16 1736  vancomycin (VANCOCIN) 1-5 GM/200ML-% IVPB    Comments:  Cherylann Banas   : cabinet override      12/06/16 1736 12/07/16 0544   12/06/16 1200  vancomycin (VANCOCIN) IVPB 1000 mg/200 mL premix  Status:  Discontinued     1,000 mg 200 mL/hr over 60 Minutes Intravenous Every M-W-F (Hemodialysis) 12/04/16 1708 12/08/16 1136      Medications:  Scheduled: . calcitRIOL  1 mcg Oral Q M,W,F-HD  . calcium acetate  1,334 mg Oral TID WC  . cinacalcet  90 mg Oral Q breakfast  . [START ON 12/14/2016] darbepoetin (ARANESP) injection - DIALYSIS  200 mcg Intravenous Q Tue-HD  . multivitamin  1 tablet Oral QHS  . nystatin   Topical TID  . sertraline  50 mg Oral Daily    Objective: Vital signs in last 24 hours: Temp:  [97.6 F (36.4 C)-98.2 F (36.8 C)] 97.6 F (36.4 C) (12/06 0915) Pulse Rate:  [42-51] 45 (12/06 0915) Resp:  [8-18] 18 (12/06 0915) BP: (98-125)/(50-69) 102/50 (12/06 0915) SpO2:  [91 %-100 %] 95 % (12/06 0915) Weight:  [260 lb 2.3 oz (118 kg)] 260 lb 2.3 oz (118 kg) (12/05 2229)   General appearance: alert, cooperative and no distress Resp: clear to auscultation bilaterally Chest wall: no tenderness, right chest with old line site. No drainage. Some induration remains. No pain or  erythema.  Cardio: regular rate and rhythm and S1, S2 normal GI: normal findings: bowel sounds normal and soft, non-tender and Open area to lower abdomen with some bloody drainage from previous conduit site. Malodorus.  Extremities: edema trace BLE  RUE AVG with +bruit +thrill. Some blood drainage from yesterday's access   Lab Results Recent Labs    12/08/16 0734 12/09/16 0625  WBC 7.0 7.0  HGB 8.4* 8.5*  HCT 27.2* 27.8*  NA 137 137  K 3.9 3.7  CL 95* 98*  CO2 28 28  BUN 9 18  CREATININE 4.47* 6.74*   Liver Panel No results for input(s): PROT, ALBUMIN, AST, ALT, ALKPHOS, BILITOT, BILIDIR, IBILI in the last 72 hours. Sedimentation Rate No results for input(s): ESRSEDRATE in the last 72 hours. C-Reactive Protein No results for input(s): CRP in the last 72 hours.  Microbiology: Recent Results (from the past 240 hour(s))  Culture, blood (Routine x 2)     Status: None   Collection Time: 12/03/16  6:20 PM  Result Value Ref Range Status   Specimen Description BLOOD LEFT HAND  Final   Special Requests   Final    Blood Culture adequate volume BOTTLES DRAWN AEROBIC AND ANAEROBIC   Culture NO GROWTH 5 DAYS  Final   Report Status 12/08/2016 FINAL  Final  Culture, blood (  Routine x 2)     Status: None   Collection Time: 12/03/16  9:28 PM  Result Value Ref Range Status   Specimen Description BLOOD LEFT ANTECUBITAL  Final   Special Requests   Final    Blood Culture adequate volume BOTTLES DRAWN AEROBIC ONLY   Culture NO GROWTH 5 DAYS  Final   Report Status 12/08/2016 FINAL  Final  MRSA PCR Screening     Status: Abnormal   Collection Time: 12/04/16  1:51 AM  Result Value Ref Range Status   MRSA by PCR POSITIVE (A) NEGATIVE Final    Comment:        The GeneXpert MRSA Assay (FDA approved for NASAL specimens only), is one component of a comprehensive MRSA colonization surveillance program. It is not intended to diagnose MRSA infection nor to guide or monitor treatment for MRSA  infections. RESULT CALLED TO, READ BACK BY AND VERIFIED WITH: Fulton Reek RN [phone] 12/04/16 A BROWNING   C difficile quick scan w PCR reflex     Status: None   Collection Time: 12/04/16 10:45 AM  Result Value Ref Range Status   C Diff antigen NEGATIVE NEGATIVE Final   C Diff toxin NEGATIVE NEGATIVE Final   C Diff interpretation No C. difficile detected.  Final  Culture, Urine     Status: None   Collection Time: 12/04/16 10:46 AM  Result Value Ref Range Status   Specimen Description URINE, RANDOM  Final   Special Requests NONE  Final   Culture NO GROWTH  Final   Report Status 12/05/2016 FINAL  Final    Studies/Results: [place] Chest Soft Tissue  Result Date: 12/08/2016 CLINICAL DATA:  Post removal of right chest wall dialysis catheter. EXAM: ULTRASOUND OF HEAD/NECK SOFT TISSUES TECHNIQUE: Ultrasound examination of the head and neck soft tissues was performed in the area of clinical concern. COMPARISON:  None. FINDINGS: Static grayscale images of the area of concern in the right anterior chest wall demonstrate diffuse soft tissue swelling and a tract from prior dialysis catheter removal. No localized fluid collection is seen. IMPRESSION: No evidence of abscess within the right anterior chest wall. Electronically Signed   By: Fidela Salisbury M.D.   On: 12/08/2016 03:21    Assessment: MRSA bacteremia 2/2 infected HD catheter.  - 11-27 BCx [facility] [phone]) - 2/2 (S- vanco/tet/clinda /gent/rifampin/linezolid/tygacil) - BCx 11/30 after line removed NG x 5d  - TTE 12/3 no mention of vegetations  HD line infection --> R IJ perm cath removed 11/30 - U/S of chest wall 12/5 - no evidence of abscess from previous cath site   ESRD =  - AVF present with +bruit/+thrill   Abdominal Wound = - endoscopy today with Dr. Jeffie Pollock - no identifiable source of bleeding however not able to advance to desired depth d/t stricture.   DVT = - on Eliquis   Previous Ovarian CA = - S/P  chemo/radiation in [place] "cancer free since 2000"    Plan: Total days of antibiotics: 6 vanco  Vancomycin with HD x 14d from 11/30 (Day 6/14 today) OPAT orders entered   We are available for Ms. Boyum as needed - thank you.      Janene Madeira, MSN, NP-C [facility] Cell: [phone] Pager: [phone]  12/09/2016  11:16 AM

## 2016-12-09 NOTE — Sedation Documentation (Signed)
Patient is resting comfortably. Vitals stable. 

## 2016-12-09 NOTE — Progress Notes (Signed)
PT Cancellation Note  Patient Details Name: Nature Kueker MRN: [id number] DOB: 05-May-1965   Cancelled Treatment:    Reason Eval/Treat Not Completed: Patient at procedure or test/unavailable Per RN, patient in interventional radiology getting tunneled dialysis catheter placed. Will follow up as schedule allows.   Leighton Ruff, PT, DPT  Acute Rehabilitation Services  Pager: [phone]    Rudean Hitt 12/09/2016, 4:42 PM

## 2016-12-09 NOTE — Sedation Documentation (Signed)
Vital signs stable. 

## 2016-12-09 NOTE — Sedation Documentation (Signed)
Dr Kathlene Cote in to proceed with Tunneled HD catheter

## 2016-12-09 NOTE — Sedation Documentation (Signed)
Vital signs stable. Pt is resting at this time. 

## 2016-12-09 NOTE — Interval H&P Note (Signed)
History and Physical Interval Note:  She continues to have some bleeding.   We will proceed with endoscopy.  12/09/2016 6:56 AM  Judy Kelley  has presented today for surgery, with the diagnosis of BLEEDING FROM STOMA  The various methods of treatment have been discussed with the patient and family. After consideration of risks, benefits and other options for treatment, the patient has consented to  Procedure(s): ENDOSCOPY OF ILEAL CONDUIT WITH POSSIBLE FULGURATION (N/A) as a surgical intervention .  The patient's history has been reviewed, patient examined, no change in status, stable for surgery.  I have reviewed the patient's chart and labs.  Questions were answered to the patient's satisfaction.     Judy Kelley

## 2016-12-09 NOTE — Op Note (Signed)
Procedure: Endoscopy of ileal conduit.  Preop diagnosis: Bleeding from ileal conduit.  Postop diagnosis: Same with strictures of ileal conduit.  Surgeon: Dr. Shirlyn Goltz.  Anesthesia: MAC.  EBL: None.  Drain: None.  Specimen: None.  Complications: None.  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  She was admitted to the hospital recently and was having bleeding from her ileal conduit.  She was on Eliquis.  The bleeding persisted once the Eliquis was stopped.  It was felt that endoscopy was indicated.  Procedure: Judy Kelley was taken to the operating room where she was placed on the table in the supine position.  She was given 2 g of Ancef.  Monitored anesthesia care was provided with sedation as needed.  Her abdomen was prepped with Betadine solution and she was draped in the usual sterile fashion.  A flexible cystoscope was then lubricated and inserted through her stoma which was stenotic this demonstrated a very narrow ileal conduit with intermittent stricturing.  I was able to advance the scope approximately 15 cm.  At that point I encountered a stricture that I could look through the because the angulation and tightness could not pass.  The inspection demonstrated atrophied intestinal mucosa but no obvious source of bleeding or lesions that required biopsy.  At this point the scope was removed, the drapes were removed and a dressing was applied.  She was taken to recovery room in stable condition.  There were no complications.

## 2016-12-09 NOTE — Consult Note (Signed)
Chief Complaint: Clotted AV graft of right upper extremity  Referring Physician:Dr. Madelon Lips  Supervising Physician: Corrie Mckusick  Patient Status: [facility] - In-pt  HPI: Judy Kelley is a 51 y.o. female with a history of ovarian cancer who underwent radiation therapy.  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  She is also had a cystectomy in the past with ileal conduit.  This is now nonfunctioning and has been for 10 years.  She had a PCN placed in 2007 with routine exchanges since that time.  The patient has had prior dialysis accesses that have failed.  She just moved here from [place] 3 weeks ago.  About 5 or 6 weeks ago she had a new AV graft placed in her right upper extremity in [place].  She states that is being used for the last 3 weeks.  She last had dialysis on Tuesday with no issues.  However when they tried to proceed with dialysis today she was noted to have no flow and a clotted graft.  She was admitted this hospitalization secondary to bacteremia from her prior tunneled hemodialysis catheter.  IR has been consulted for a declot of this graft.  Past Medical History:  Past Medical History:  Diagnosis Date  . Anxiety   . Cervical cancer ([place])   . DVT (deep venous thrombosis) ([place])   . Renal disorder     Past Surgical History:  Past Surgical History:  Procedure Laterality Date  . ileal conduit    . nephrosotomy    . TONSILLECTOMY    . TUBAL LIGATION      Family History:  Family History  Problem Relation Age of Onset  . Diabetes Mellitus II Mother   . Kidney disease Mother   . Diabetes Mellitus II Father   . Ovarian cancer Maternal Aunt     Social History:  reports that  has never smoked. she has never used smokeless tobacco. She reports that she does not drink alcohol or use drugs.  Allergies:  Allergies  Allergen Reactions    . Erythromycin Anaphylaxis  . Ciprofloxacin     IV only-burning to her veins, can take PO  . Chlorhexidine Rash  . Other Rash    chloraprep    Medications: Medications reviewed in epic.  Please HPI for pertinent positives, otherwise complete 10 system ROS negative.  Mallampati Score: MD Evaluation Airway: WNL Heart: WNL Abdomen: Other (comments) Abdomen comments: old ilealconduit site, covered with a dressing Chest/ Lungs: WNL ASA  Classification: 3 Mallampati/Airway Score: Two  Physical Exam: BP (!) 102/50 (BP Location: Left Arm)   Pulse (!) 45   Temp 97.6 F (36.4 C) (Oral)   Resp 18   Ht 5' 7"  (1.702 m)   Wt 260 lb 2.3 oz (118 kg)   SpO2 95%   BMI 40.74 kg/m  Body mass index is 40.74 kg/m. General: pleasant, obese white female who is sitting up in her chair in no acute distress  HEENT: head is normocephalic, atraumatic.  Sclera are noninjected.  PERRL.  Ears and nose without any masses or lesions.  Mouth is pink and moist Heart: regular, rate, and rhythm.  Normal s1,s2. No obvious murmurs, gallops, or rubs noted.  Palpable radial and pedal pulses bilaterally Lungs: CTAB, no wheezes, rhonchi, or rales noted.  Respiratory effort nonlabored Abd: soft, NT, obese, +BS, no masses.  Dressing over  the ileal conduit. MS: No palpable thrill in her right upper extremity graft. Psych: A&Ox3 with an appropriate affect.   Labs: Results for orders placed or performed during the hospital encounter of 12/03/16 (from the past 48 hour(s))  Hepatitis B surface antigen     Status: None   Collection Time: 12/07/16  2:15 PM  Result Value Ref Range   Hepatitis B Surface Ag Negative Negative    Comment: (NOTE) CALLED AND FAXED TO [facility] AT 9:00 BY PW Performed At: [facility] [place], [place] [id number] Rush Farmer MD ZH:[id number]   CBC     Status: Abnormal   Collection Time: 12/08/16  7:34 AM  Result Value Ref Range   WBC 7.0 4.0 - 10.5 K/uL   RBC  2.69 (L) 3.87 - 5.11 MIL/uL   Hemoglobin 8.4 (L) 12.0 - 15.0 g/dL   HCT 27.2 (L) 36.0 - 46.0 %   MCV 101.1 (H) 78.0 - 100.0 fL   MCH 31.2 26.0 - 34.0 pg   MCHC 30.9 30.0 - 36.0 g/dL   RDW 14.8 11.5 - 15.5 %   Platelets 351 150 - 400 K/uL  APTT     Status: Abnormal   Collection Time: 12/08/16  7:34 AM  Result Value Ref Range   aPTT 77 (H) 24 - 36 seconds    Comment:        IF BASELINE aPTT IS ELEVATED, SUGGEST PATIENT RISK ASSESSMENT BE USED TO DETERMINE APPROPRIATE ANTICOAGULANT THERAPY.   Heparin level (unfractionated)     Status: Abnormal   Collection Time: 12/08/16  7:34 AM  Result Value Ref Range   Heparin Unfractionated 1.02 (H) 0.30 - 0.70 IU/mL    Comment:        IF HEPARIN RESULTS ARE BELOW EXPECTED VALUES, AND PATIENT DOSAGE HAS BEEN CONFIRMED, SUGGEST FOLLOW UP TESTING OF ANTITHROMBIN III LEVELS.   Basic metabolic panel     Status: Abnormal   Collection Time: 12/08/16  7:34 AM  Result Value Ref Range   Sodium 137 135 - 145 mmol/L   Potassium 3.9 3.5 - 5.1 mmol/L   Chloride 95 (L) 101 - 111 mmol/L   CO2 28 22 - 32 mmol/L   Glucose, Bld 84 65 - 99 mg/dL   BUN 9 6 - 20 mg/dL   Creatinine, Ser 4.47 (H) 0.44 - 1.00 mg/dL   Calcium 8.0 (L) 8.9 - 10.3 mg/dL   GFR calc non Af Amer 10 (L) >60 mL/min   GFR calc Af Amer 12 (L) >60 mL/min    Comment: (NOTE) The eGFR has been calculated using the CKD EPI equation. This calculation has not been validated in all clinical situations. eGFR's persistently <60 mL/min signify possible Chronic Kidney Disease.    Anion gap 14 5 - 15  CBC     Status: Abnormal   Collection Time: 12/09/16  6:25 AM  Result Value Ref Range   WBC 7.0 4.0 - 10.5 K/uL   RBC 2.78 (L) 3.87 - 5.11 MIL/uL   Hemoglobin 8.5 (L) 12.0 - 15.0 g/dL   HCT 27.8 (L) 36.0 - 46.0 %   MCV 100.0 78.0 - 100.0 fL   MCH 30.6 26.0 - 34.0 pg   MCHC 30.6 30.0 - 36.0 g/dL   RDW 14.3 11.5 - 15.5 %   Platelets 334 150 - 400 K/uL  Basic metabolic panel     Status:  Abnormal   Collection Time: 12/09/16  6:25 AM  Result Value Ref Range  Sodium 137 135 - 145 mmol/L   Potassium 3.7 3.5 - 5.1 mmol/L   Chloride 98 (L) 101 - 111 mmol/L   CO2 28 22 - 32 mmol/L   Glucose, Bld 99 65 - 99 mg/dL   BUN 18 6 - 20 mg/dL   Creatinine, Ser 6.74 (H) 0.44 - 1.00 mg/dL    Comment: DELTA CHECK NOTED   Calcium 8.6 (L) 8.9 - 10.3 mg/dL   GFR calc non Af Amer 6 (L) >60 mL/min   GFR calc Af Amer 7 (L) >60 mL/min    Comment: (NOTE) The eGFR has been calculated using the CKD EPI equation. This calculation has not been validated in all clinical situations. eGFR's persistently <60 mL/min signify possible Chronic Kidney Disease.    Anion gap 11 5 - 15    Imaging: [place] Chest Soft Tissue  Result Date: 12/08/2016 CLINICAL DATA:  Post removal of right chest wall dialysis catheter. EXAM: ULTRASOUND OF HEAD/NECK SOFT TISSUES TECHNIQUE: Ultrasound examination of the head and neck soft tissues was performed in the area of clinical concern. COMPARISON:  None. FINDINGS: Static grayscale images of the area of concern in the right anterior chest wall demonstrate diffuse soft tissue swelling and a tract from prior dialysis catheter removal. No localized fluid collection is seen. IMPRESSION: No evidence of abscess within the right anterior chest wall. Electronically Signed   By: Fidela Salisbury M.D.   On: 12/08/2016 03:21    Assessment/Plan 1.  Clotted right upper extremity AV graft  Will require a shuntogram with possible intervention depending on the findings.  The patient has had this procedure before with other accesses.  She is aware there is a possibility of intervention such as thrombectomy or thrombolysis for possible angioplasty, stenting.  She is also aware if we are unable to get her graft to work that she will require a tunneled hemodialysis catheter to be placed.  The patient did have a procedure which required sedation medicine this morning at 7 AM, however she is no  longer sedated and is alert and oriented and able to hold a conversation in her normal state of mind.  She is consentable. Risks and benefits discussed with the patient including, but not limited to bleeding, infection, vascular injury, pulmonary embolism, need for tunneled HD catheter placement or even death. All of the patient's questions were answered, patient is agreeable to proceed. Consent signed and in chart.   Thank you for this interesting consult.  I greatly enjoyed meeting Hopie Pellegrin and look forward to participating in their care.  A copy of this report was sent to the requesting provider on this date.  Electronically Signed: Henreitta Cea 12/09/2016, 12:12 PM   I spent a total of 40 Minutes    in face to face in clinical consultation, greater than 50% of which was counseling/coordinating care for clotted AV graft of RUE

## 2016-12-09 NOTE — Procedures (Signed)
Interventional Radiology Procedure Note  Procedure: Tunneled right IJ HD catheter  Complications: None  Estimated Blood Loss: < 89mL  Findings: 19 cm tip to cuff length tunneled Palindrome cath placed via right IJ vein.  Tip in RA.  OK to use.  Venetia Night. Kathlene Cote, M.D Pager:  [phone]

## 2016-12-09 NOTE — Sedation Documentation (Signed)
Pt grimacing 

## 2016-12-09 NOTE — Progress Notes (Addendum)
Pharmacy Antibiotic Note  Judy Kelley is a 51 y.o. female admitted on 12/03/2016 with bacteremia.  Pharmacy has been consulted for vancomycin dosing.  Patient is ESRD on HD TTSat. Per written records patient brought from HD: blood cx drawn on 11/27 and catheter tip culture from 11/28 + for staph aureus.   Patient received 1.5g IV 11/27 with HD and 1g IV with HD PTA. Vancomycin random level upon admission was in therapeutic range at 33mcg/mL.  Went to HD on 12/3 and then 12/4 to get back on schedule- had slightly below full session on 12/3, but tolerated full 4h @ BFR 413mL/min on 12/4. Dose was not given after HD on 12/4, but was given on floor 12/5.  To go for thrombectomy of fistula today, and to HD tomorrow (Friday, off schedule).  Plan: Vancomycin 1g IV x1 with HD Friday 12/7, then resume Vancomycin 1g IV qHD-TTSat on 12/8. Goal pre-HD level 15-25 mcg/mL and post-HD leve 5-15 mcg/mL F/u HD schedule and tolerance  Follow TTE and repeat BCx for LOT plans- per ID note yesterday, if both negative plan is to treat for 2 weeks from 11/30 (would make last dose due on 12/13 with HD as this dose will last until HD on 12/15)  Height: 5\' 7"  (170.2 cm) Weight: 260 lb 2.3 oz (118 kg) IBW/kg (Calculated) : 61.6  Temp (24hrs), Avg:97.8 F (36.6 C), Min:97.6 F (36.4 C), Max:98.2 F (36.8 C)  Recent Labs  Lab 12/03/16 1843 12/03/16 2138 12/04/16 0611 12/04/16 1113 12/05/16 0502 12/06/16 0612 12/07/16 0509 12/08/16 0734 12/09/16 0625  WBC  --   --  5.4  --  5.6 5.2 7.6 7.0 7.0  CREATININE  --   --  6.24*  --  7.95* 9.58* 5.81* 4.47* 6.74*  LATICACIDVEN 2.77* 2.24*  --   --   --   --   --   --   --   VANCOTROUGH  --   --   --  19  --   --   --   --   --   VANCORANDOM  --   --  20  --   --   --   --   --   --     Estimated Creatinine Clearance: 13 mL/min (A) (by C-G formula based on SCr of 6.74 mg/dL (H)).    Allergies  Allergen Reactions  . Erythromycin Anaphylaxis  .  Ciprofloxacin     IV only-burning to her veins, can take PO  . Chlorhexidine Rash  . Other Rash    chloraprep    Antimicrobials this admission: Vancomycin 11/27 (PTA) >> 12/1 VR = 20   Microbiology results: 12/1 CDiff: neg for toxin and antigen 12/1: UCx: neg 11/30: BCx: neg 11/30 MRSA: PCR positive  11/27 (OSH) BCx: MRSA  11/28 (OSH) cath tip culture: MRSA  Judy Kelley, PharmD, BCPS Clinical Pharmacist Clinical Phone for 12/09/2016 until 3:30pm: [phone] If after 3:30pm, please call main pharmacy at [phone] 12/09/2016 11:51 AM

## 2016-12-09 NOTE — Anesthesia Procedure Notes (Signed)
Procedure Name: MAC Date/Time: 12/09/2016 7:20 AM Performed by: Harden Mo, CRNA Pre-anesthesia Checklist: Patient identified, Emergency Drugs available, Suction available and Patient being monitored Patient Re-evaluated:Patient Re-evaluated prior to induction Oxygen Delivery Method: Simple face mask Preoxygenation: Pre-oxygenation with 100% oxygen Induction Type: IV induction Placement Confirmation: positive ETCO2 and breath sounds checked- equal and bilateral Dental Injury: Teeth and Oropharynx as per pre-operative assessment

## 2016-12-09 NOTE — Transfer of Care (Signed)
Immediate Anesthesia Transfer of Care Note  Patient: Judy Kelley  Procedure(s) Performed: ENDOSCOPY OF ILEAL CONDUIT (N/A Abdomen)  Patient Location: PACU  Anesthesia Type:MAC  Level of Consciousness: awake, alert  and oriented  Airway & Oxygen Therapy: Patient Spontanous Breathing  Post-op Assessment: Report given to RN, Post -op Vital signs reviewed and stable and Patient moving all extremities X 4  Post vital signs: Reviewed and stable  Last Vitals:  Vitals:   12/08/16 2229 12/09/16 0459  BP: (!) 98/55 (!) 111/59  Pulse: (!) 51 (!) 50  Resp: 16 17  Temp: 36.7 C 36.8 C  SpO2: 100% 91%    Last Pain:  Vitals:   12/09/16 0459  TempSrc: Oral  PainSc:       Patients Stated Pain Goal: 2 (37/54/36 0677)  Complications: No apparent anesthesia complications

## 2016-12-09 NOTE — Progress Notes (Signed)
PROGRESS NOTE    Judy Kelley  TDV:[id number] DOB: 09-27-1965 DOA: 12/03/2016 PCP: System, Pcp Not In     Brief Narrative:  Judy Kelley a 51 y.o.femalewithhistory of ESRD on hemodialysis, history of DVT, previous history of ovarian cancer in remission who had just recently moved from [place] was found to have redness around her right subclavian dialysis catheter 3 days prior to admission and had associated fever and chills. Blood cultures were obtained at dialysis center and patient was empirically started on vancomycin. Patient had received her second dose of vancomycin at dialysis center. Cultures came back as staph aureus and was instructed to come to the ER. She was found to have MRSA bacteremia and right subclavian dialysis catheter was removed. ID has been following. She also had bleeding from ileal conduit stoma for which urology has been consulted. She underwent endoscopy of ileal conduit on 12/6 which did not reveal source of bleed, but revealed strictures of ileal conduit.    Assessment & Plan:   Principal Problem:   Bacteremia Active Problems:   ESRD on dialysis ([place])   Hypokalemia   History of ovarian cancer   MRSA bacteremia  MRSA bacteremia -Likely secondary to dialysis catheter (right subclavian) that was removed 11/30  -Initial blood cultures obtained 11/27 as an outpatient, per nephrology showed to be MRSA -Blood cultures 11/30 negative at 5 days  -Echocardiogram: EF 60-65%, grade 2DD, no mention of valvular involvement  -[place] chest: No evidence of abscess within the right anterior chest wall -Infectious disease consulted and appreciated- recommended 2 weeks of IV vanc with HD   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 Spoke with Dr. Jeffie Pollock post-operatively. Plan to hold anticoagulation for few days and watch for bleed    ESRD -Nephrology consulted and appreciated -HD TTS  Clotted AV graft RUE -IR consulted for shuntogram, possible intervention   Diarrhea -C. difficile negative -Improved   History of DVT -Was on Eliquis at home -Holding anticoagulation as above   History of ovarian cancer -Currently in remission  Normocytic normochromic anemia/anemia of chronic renal disease -Hemoglobin dropped to 6.8, received 1uPRBC -Hemoglobin now stable   Dermatitis/rash -Appears to be more fungal vs pityriasis rosea (this would be self limited with only symptomatic treatment), only on flanks and B/L -Continue nystatin powder, sarna and continue to monitor   Morbid obesity  -BMI 40 -Discuss weight management with PCP on discharge   DVT prophylaxis: Hold Eliquis  Code Status: Full Family Communication: No family at bedside Disposition Plan: pending    Consultants:   Nephrology  ID  Urology  IR   Antimicrobials:  Anti-infectives (From admission, onward)   Start     Dose/Rate Route Frequency Ordered Stop   12/11/16 1200  vancomycin (VANCOCIN) IVPB 1000 mg/200 mL premix     1,000 mg 200 mL/hr over 60 Minutes Intravenous Every T-Th-Sa (Hemodialysis) 12/09/16 1151     12/10/16 1200  vancomycin (VANCOCIN) IVPB 1000 mg/200 mL premix     1,000 mg 200 mL/hr over 60 Minutes Intravenous Every M-W-F (Hemodialysis) 12/09/16 1151 12/13/16 1159   12/09/16 1200  vancomycin (VANCOCIN) IVPB 1000 mg/200 mL premix  Status:  Discontinued     1,000 mg 200 mL/hr over 60 Minutes Intravenous Every  T-Th-Sa (Hemodialysis) 12/08/16 1136 12/09/16 1151   12/08/16 1230  vancomycin (VANCOCIN) IVPB 1000 mg/200 mL premix     1,000 mg 200 mL/hr over 60 Minutes Intravenous  Once 12/08/16 1136 12/08/16 1353   12/06/16 1736  vancomycin  (VANCOCIN) 1-5 GM/200ML-% IVPB    Comments:  Cherylann Banas   : cabinet override      12/06/16 1736 12/07/16 0544   12/06/16 1200  vancomycin (VANCOCIN) IVPB 1000 mg/200 mL premix  Status:  Discontinued     1,000 mg 200 mL/hr over 60 Minutes Intravenous Every M-W-F (Hemodialysis) 12/04/16 1708 12/08/16 1136       Subjective: Doing well this morning, no new complaints. Underwent endoscopy conduit today.    Objective: Vitals:   12/09/16 0822 12/09/16 0830 12/09/16 0845 12/09/16 0915  BP:  99/69 (!) 105/58 (!) 102/50  Pulse: (!) 45 (!) 42 (!) 42 (!) 45  Resp: (!) 8 (!) 9 12 18   Temp:   97.7 F (36.5 C) 97.6 F (36.4 C)  TempSrc:    Oral  SpO2: 91% 100% 93% 95%  Weight:      Height:        Intake/Output Summary (Last 24 hours) at 12/09/2016 1319 Last data filed at 12/09/2016 0916 Gross per 24 hour  Intake 540 ml  Output 175 ml  Net 365 ml   Filed Weights   12/07/16 1816 12/07/16 2133 12/08/16 2229  Weight: 113.6 kg (250 lb 7.1 oz) 113 kg (249 lb 1.9 oz) 118 kg (260 lb 2.3 oz)    Examination:  General exam: Appears calm and comfortable  Respiratory system: Clear to auscultation. Respiratory effort normal. Cardiovascular system: S1 & S2 heard, RRR. No JVD, murmurs, rubs, gallops or clicks. No pedal edema. Gastrointestinal system: Abdomen is nondistended, soft and nontender. No organomegaly or masses felt. Normal bowel sounds heard. Central nervous system: Alert and oriented. No focal neurological deficits. Extremities: Symmetric 5 x 5 power. +nonpalpable thrill RUE graft  Skin: No rashes, lesions or ulcers Psychiatry: Judgement and insight appear normal. Mood & affect appropriate.   Data Reviewed: I have personally reviewed following labs and imaging studies  CBC: Recent Labs  Lab 12/03/16 1818  12/05/16 0502 12/06/16 0612 12/07/16 0509 12/08/16 0734 12/09/16 0625  WBC 7.7   < > 5.6 5.2 7.6 7.0 7.0  NEUTROABS 5.9  --   --   --   --   --   --   HGB 8.0*   < > 6.8*  7.6* 8.7* 8.4* 8.5*  HCT 25.0*   < > 21.4* 23.9* 27.1* 27.2* 27.8*  MCV 97.3   < > 98.2 97.6 98.9 101.1* 100.0  PLT 314   < > 336 290 343 351 334   < > = values in this interval not displayed.   Basic Metabolic Panel: Recent Labs  Lab 12/05/16 0502 12/06/16 0612 12/07/16 0509 12/08/16 0734 12/09/16 0625  NA 137 136 136 137 137  K 3.8 3.5 3.8 3.9 3.7  CL 97* 97* 98* 95* 98*  CO2 27 26 26 28 28   GLUCOSE 79 82 85 84 99  BUN 32* 41* 18 9 18   CREATININE 7.95* 9.58* 5.81* 4.47* 6.74*  CALCIUM 8.7* 8.2* 8.2* 8.0* 8.6*  MG 2.3  --   --   --   --    GFR: Estimated Creatinine Clearance: 13 mL/min (A) (by C-G formula based on SCr of 6.74 mg/dL (H)). Liver Function Tests: Recent Labs  Lab 12/03/16 1818  AST 41  ALT 21  ALKPHOS 137*  BILITOT 0.5  PROT 8.1  ALBUMIN 3.1*   No results for input(s): LIPASE, AMYLASE in the last 168 hours. No results for input(s): AMMONIA in the last 168 hours. Coagulation Profile: Recent Labs  Lab 12/03/16 1818  INR 1.14   Cardiac Enzymes: No results for input(s): CKTOTAL, CKMB, CKMBINDEX, TROPONINI in the last 168 hours. BNP (last 3 results) No results for input(s): PROBNP in the last 8760 hours. HbA1C: No results for input(s): HGBA1C in the last 72 hours. CBG: No results for input(s): GLUCAP in the last 168 hours. Lipid Profile: No results for input(s): CHOL, HDL, LDLCALC, TRIG, CHOLHDL, LDLDIRECT in the last 72 hours. Thyroid Function Tests: No results for input(s): TSH, T4TOTAL, FREET4, T3FREE, THYROIDAB in the last 72 hours. Anemia Panel: No results for input(s): VITAMINB12, FOLATE, FERRITIN, TIBC, IRON, RETICCTPCT in the last 72 hours. Sepsis Labs: Recent Labs  Lab 12/03/16 1843 12/03/16 2138  LATICACIDVEN 2.77* 2.24*    Recent Results (from the past 240 hour(s))  Culture, blood (Routine x 2)     Status: None   Collection Time: 12/03/16  6:20 PM  Result Value Ref Range Status   Specimen Description BLOOD LEFT HAND  Final    Special Requests   Final    Blood Culture adequate volume BOTTLES DRAWN AEROBIC AND ANAEROBIC   Culture NO GROWTH 5 DAYS  Final   Report Status 12/08/2016 FINAL  Final  Culture, blood (Routine x 2)     Status: None   Collection Time: 12/03/16  9:28 PM  Result Value Ref Range Status   Specimen Description BLOOD LEFT ANTECUBITAL  Final   Special Requests   Final    Blood Culture adequate volume BOTTLES DRAWN AEROBIC ONLY   Culture NO GROWTH 5 DAYS  Final   Report Status 12/08/2016 FINAL  Final  MRSA PCR Screening     Status: Abnormal   Collection Time: 12/04/16  1:51 AM  Result Value Ref Range Status   MRSA by PCR POSITIVE (A) NEGATIVE Final    Comment:        The GeneXpert MRSA Assay (FDA approved for NASAL specimens only), is one component of a comprehensive MRSA colonization surveillance program. It is not intended to diagnose MRSA infection nor to guide or monitor treatment for MRSA infections. RESULT CALLED TO, READ BACK BY AND VERIFIED WITH: Fulton Reek RN [phone] 12/04/16 A BROWNING   C difficile quick scan w PCR reflex     Status: None   Collection Time: 12/04/16 10:45 AM  Result Value Ref Range Status   C Diff antigen NEGATIVE NEGATIVE Final   C Diff toxin NEGATIVE NEGATIVE Final   C Diff interpretation No C. difficile detected.  Final  Culture, Urine     Status: None   Collection Time: 12/04/16 10:46 AM  Result Value Ref Range Status   Specimen Description URINE, RANDOM  Final   Special Requests NONE  Final   Culture NO GROWTH  Final   Report Status 12/05/2016 FINAL  Final       Radiology Studies: [place] Chest Soft Tissue  Result Date: 12/08/2016 CLINICAL DATA:  Post removal of right chest wall dialysis catheter. EXAM: ULTRASOUND OF HEAD/NECK SOFT TISSUES TECHNIQUE: Ultrasound examination of the head and neck soft tissues was performed in the area of clinical concern. COMPARISON:  None. FINDINGS: Static grayscale images of the area of concern in the right anterior chest  wall demonstrate diffuse soft tissue swelling and a tract  from prior dialysis catheter removal. No localized fluid collection is seen. IMPRESSION: No evidence of abscess within the right anterior chest wall. Electronically Signed   By: Fidela Salisbury M.D.   On: 12/08/2016 03:21      Scheduled Meds: . calcitRIOL  1 mcg Oral Q M,W,F-HD  . calcium acetate  1,334 mg Oral TID WC  . cinacalcet  90 mg Oral Q breakfast  . [START ON 12/14/2016] darbepoetin (ARANESP) injection - DIALYSIS  200 mcg Intravenous Q Tue-HD  . multivitamin  1 tablet Oral QHS  . nystatin   Topical TID  . sertraline  50 mg Oral Daily   Continuous Infusions: . [START ON 12/11/2016] vancomycin    . [START ON 12/10/2016] vancomycin       LOS: 6 days    Time spent: 30 minutes   Dessa Phi, DO [facility] [email or web address] Password [email or web address] 12/09/2016, 1:19 PM

## 2016-12-09 NOTE — Sedation Documentation (Signed)
Pt is resting at this time. Intermittently complains of pain at site where dr is working

## 2016-12-09 NOTE — Sedation Documentation (Signed)
Pt resting at this time. Vitals stable, pt able to follow commands and denies pain

## 2016-12-09 NOTE — Sedation Documentation (Signed)
Pt tolerated procedures very well. Report given to 60M nurse.

## 2016-12-09 NOTE — Progress Notes (Signed)
Subjective:  Just Back from OR=Dr Wren=  Endoscopy of ileal conduit.For HD today.  Objective Vital signs in last 24 hours: Vitals:   12/09/16 0822 12/09/16 0830 12/09/16 0845 12/09/16 0915  BP:  99/69 (!) 105/58 (!) 102/50  Pulse: (!) 45 (!) 42 (!) 42 (!) 45  Resp: (!) 8 (!) 9 12 18   Temp:   97.7 F (36.5 C) 97.6 F (36.4 C)  TempSrc:    Oral  SpO2: 91% 100% 93% 95%  Weight:      Height:       Weight change: 3.2 kg (7 lb 0.9 oz)  Physical Exam: General:alert , pleasant Obese ,OX3  Heart:RRR ,no m,r,g Lungs:CTA Abdomen:Obese , Bs +, soft , NT, ND ,lower adb dressing dry clean  : Dialysis Access: no  bruit or thrill RUA AVGG/ not heard with US doppler Extremities ; trace bipedal edema   Dialysis Orders: [facility] 4 hr EDW 117.5 2 K 2 Ca AVGG heparin 5000 venofer 50 mircera 100 q 2 weeks - last dose 11/27 - calcitriol 1- no Fe right upper AVGG Recent labs: hgb 7.7 dropping 11% sat, no ferritin, no K ipTH 461   Problem/Plan:  1. Clotted RUA AVGG- IR Notified , first time clotted, hopefuuly can declott without Perm cath  In setting of recent infet . Last BC neg.  2. MRSA bacteremia thought secondary to catheter sepsis (+ BC and perm cath exit drainage cultures 11/27). ID  Eval/ R IJ Perm cath removed 11/30/1/8 / On IV Vanco.ID seeinghas TTE yest prelim  No vegs  Seen  /repeat BC no growth so far.  Chest [place] without abscess in chest wall 3. ESRD- TTS, K 3.7  Vol ok  Today , clotted AVG today iok for Friday hd after IR declots  no heparin systemically in dialysis.  Used RUE AVG without difficulty last hd  4. Hypertension/volume- BP 105/58not on BP meds - losing weight/ last wt 118 bed wt ,not accurate    5. Anemia- hgb 7.1>6.8> 7.6>8.7 >8.5  12/2given. 1 u PRBCS /Fe def anemia - tsat 11%OP hold fe load sec Bacteremia  -Mircera last dose100 11/30/16  - next would be scheduled 12/14/16 increase 200  6. Metabolic bone disease- Continue calcitriol, sensipar  90, calcium acetate 7. Nutrition- alb 3.1holding  nepro due to diarrhea - now eating  Better   8. Hx DVT - was on Eliqu[place] admit /had been on coumadin in [place]. Remotely  Past/ Eliquis on hold with bleeding Old Ileal conduit site/  9. 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 Ramonita Lab holding Eliquis.   10. Hx ovarianvs cervicalcancers/p prior chemo/radiation( In [place]. )but not surgery- "cancer free since 2000" no records available  Ernest Haber, PA-C [facility] Beeper [phone] 12/09/2016,9:38 AM  LOS: 6 days   Labs: Basic Metabolic Panel: Recent Labs  Lab 12/07/16 0509 12/08/16 0734 12/09/16 0625  NA 136 137 137  K 3.8 3.9 3.7  CL 98* 95* 98*  CO2 26 28 28   GLUCOSE 85 84 99  BUN 18 9 18   CREATININE 5.81* 4.47* 6.74*  CALCIUM 8.2* 8.0* 8.6*   Liver Function Tests: Recent Labs  Lab 12/03/16 1818  AST 41  ALT 21  ALKPHOS 137*  BILITOT 0.5  PROT 8.1  ALBUMIN 3.1*   No results for input(s): LIPASE, AMYLASE in the last 168 hours. No results for input(s): AMMONIA in the last 168 hours. CBC: Recent  Labs  Lab 12/03/16 1818  12/05/16 0502 12/06/16 0612 12/07/16 0509 12/08/16 0734 12/09/16 0625  WBC 7.7   < > 5.6 5.2 7.6 7.0 7.0  NEUTROABS 5.9  --   --   --   --   --   --   HGB 8.0*   < > 6.8* 7.6* 8.7* 8.4* 8.5*  HCT 25.0*   < > 21.4* 23.9* 27.1* 27.2* 27.8*  MCV 97.3   < > 98.2 97.6 98.9 101.1* 100.0  PLT 314   < > 336 290 343 351 334   < > = values in this interval not displayed.   Cardiac Enzymes: No results for input(s): CKTOTAL, CKMB, CKMBINDEX, TROPONINI in the last 168 hours. CBG: No results for input(s): GLUCAP in the last 168 hours.  Studies/Results: [place] Chest Soft Tissue  Result Date: 12/08/2016 CLINICAL DATA:  Post removal of right chest wall dialysis catheter. EXAM: ULTRASOUND OF HEAD/NECK SOFT TISSUES TECHNIQUE: Ultrasound examination of the head and neck  soft tissues was performed in the area of clinical concern. COMPARISON:  None. FINDINGS: Static grayscale images of the area of concern in the right anterior chest wall demonstrate diffuse soft tissue swelling and a tract from prior dialysis catheter removal. No localized fluid collection is seen. IMPRESSION: No evidence of abscess within the right anterior chest wall. Electronically Signed   By: Fidela Salisbury M.D.   On: 12/08/2016 03:21   Medications: . vancomycin     . calcitRIOL  1 mcg Oral Q M,W,F-HD  . calcium acetate  1,334 mg Oral TID WC  . cinacalcet  90 mg Oral Q breakfast  . multivitamin  1 tablet Oral QHS  . nystatin   Topical TID  . sertraline  50 mg Oral Daily

## 2016-12-09 NOTE — Procedures (Signed)
Interventional Radiology Procedure Note  Procedure: Attempt at declot of right upper extremity loop graft, performed at [place].  Stent of outflow stenosis, with recurrent diffuse thrombosis. Will now place HD catheter.   Findings:  Recurrent thrombosis during declot, likely related to the patients current admission MRSA bacteremia.   .  Complications: None  Recommendations:  - Will place HD cath - Will need surgical referral for new access.   - Routine wound care   Signed,  Dulcy Fanny. Earleen Newport, DO

## 2016-12-10 ENCOUNTER — Encounter (HOSPITAL_COMMUNITY): Payer: Self-pay | Admitting: Interventional Radiology

## 2016-12-10 LAB — BASIC METABOLIC PANEL
Anion gap: 13 (ref 5–15)
BUN: 25 mg/dL — AB (ref 6–20)
CO2: 26 mmol/L (ref 22–32)
Calcium: 9.1 mg/dL (ref 8.9–10.3)
Chloride: 98 mmol/L — ABNORMAL LOW (ref 101–111)
Creatinine, Ser: 8.12 mg/dL — ABNORMAL HIGH (ref 0.44–1.00)
GFR calc non Af Amer: 5 mL/min — ABNORMAL LOW (ref >60)
GFR, EST AFRICAN AMERICAN: 6 mL/min — AB (ref >60)
Glucose, Bld: 94 mg/dL (ref 65–99)
POTASSIUM: 4.2 mmol/L (ref 3.5–5.1)
SODIUM: 137 mmol/L (ref 135–145)

## 2016-12-10 LAB — CBC
HEMATOCRIT: 27.6 % — AB (ref 36.0–46.0)
HEMOGLOBIN: 8.5 g/dL — AB (ref 12.0–15.0)
MCH: 31.1 pg (ref 26.0–34.0)
MCHC: 30.8 g/dL (ref 30.0–36.0)
MCV: 101.1 fL — ABNORMAL HIGH (ref 78.0–100.0)
Platelets: 326 10*3/uL (ref 150–400)
RBC: 2.73 MIL/uL — AB (ref 3.87–5.11)
RDW: 14.7 % (ref 11.5–15.5)
WBC: 7.4 10*3/uL (ref 4.0–10.5)

## 2016-12-10 MED ORDER — PRO-STAT SUGAR FREE PO LIQD: 30.0000 mL | Freq: Two times a day (BID) | ORAL | Status: DC

## 2016-12-10 MED ORDER — CALCITRIOL 0.5 MCG PO CAPS
ORAL_CAPSULE | ORAL | Status: AC
  Filled 2016-12-10: qty 2

## 2016-12-10 MED ORDER — PRO-STAT SUGAR FREE PO LIQD
30.0000 mL | Freq: Three times a day (TID) | ORAL | Status: DC
  Administered 2016-12-10 – 2016-12-12 (×5): 30 mL via ORAL
  Filled 2016-12-10 (×6): qty 30

## 2016-12-10 MED ORDER — VANCOMYCIN HCL IN DEXTROSE 1-5 GM/200ML-% IV SOLN
INTRAVENOUS | Status: AC
  Administered 2016-12-10: 1000 mg via INTRAVENOUS
  Filled 2016-12-10: qty 200

## 2016-12-10 NOTE — Progress Notes (Signed)
Nutrition Follow-up  DOCUMENTATION CODES:   Morbid obesity  INTERVENTION:   -Pro-Stat 30 mL TID   NUTRITION DIAGNOSIS:   Inadequate oral intake related to acute illness, nausea, vomiting, diarrhea as evidenced by per patient/family report, meal completion < 25%.  Improving as pt appetite improved, po 100%, adding protein supplement  GOAL:   Patient will meet greater than or equal to 90% of their needs  Improving  MONITOR:   PO intake, Weight trends, Labs, Skin  REASON FOR ASSESSMENT:   Malnutrition Screening Tool    ASSESSMENT:   51 y.o. female with history of ESRD on hemodialysis, history of DVT, previous history of ovarian cancer in remission who had just recently moved from [place] was found to have redness around her right subclavian dialysis catheter 3 days ago and had associated fever and chills.  Blood cultures were obtained at dialysis center and patient was empirically started on vancomycin.  Patient had received her second dose of vancomycin today at dialysis center.  Cultures came back as staph aureus today and was instructed to come to the ER.  Over the last 2 days patient also has been having poor appetite with nausea vomiting and diarrhea.  Denies any abdominal pain but has chronic pain around the right flank area where her nephrostomy tube is.  Patient's dialysis catheter was removed 2 days ago by vascular surgeon.  12/6 Tunneled right IJ HD catheter placed 12/6 Endoscopy of ileal conduit  Recorded po intake 100% of meals; does not tolerate Nepro, reports causes diarrhea Received HD today, UF goal 1.5 L  Labs: reviewed Meds: phoslo, sensipar  Diet Order:  Diet renal/carb modified with fluid restriction Diet-HS Snack? Nothing; Room service appropriate? Yes; Fluid consistency: Thin  EDUCATION NEEDS:   No education needs have been identified at this time  Skin:  Skin Assessment: Skin Integrity Issues: Skin Integrity Issues:: Incisions Incisions: R  abdominal incision  Last BM:  12/6  Height:   Ht Readings from Last 1 Encounters:  12/04/16 5\' 7"  (1.702 m)    Weight:   Wt Readings from Last 1 Encounters:  12/10/16 253 lb 8.5 oz (115 kg)    Ideal Body Weight:  61.36 kg  BMI:  Body mass index is 39.71 kg/m.  Estimated Nutritional Needs:   Kcal:  2100-2300  Protein:  115-130 grams  Fluid:  1 L + UOP/day   Kerman Passey MS, RD, LDN, CNSC [phone] Pager  [phone] Weekend/On-Call Pager

## 2016-12-10 NOTE — Progress Notes (Addendum)
Subjective:  No complaints other than feeling sleepy.  Objective Vital signs in last 24 hours: Vitals:   12/10/16 0803 12/10/16 0810 12/10/16 0830 12/10/16 0900  BP: 109/60 (!) 92/59 (!) 98/59 (!) 97/51  Pulse: (!) 49 (!) 51 (!) 50 (!) 48  Resp: 11     Temp:      TempSrc:      SpO2:      Weight:      Height:       Weight change: -2 kg (-6.5 oz)  Physical Exam: General:alert , pleasant Obese ,OX3  Heart:RRR ,no m,r,g Lungs:CTA Abdomen:Obese , Bs +, soft , NT, ND ,lower adb dressing dry clean Dialysis Access: persistently clotted RUE AVG, new R IJ TDC in place (12/6) Extremities: trace bipedal edema   Dialysis Orders: [facility] 4 hr EDW 117.5 2 K 2 Ca AVGG heparin 5000 venofer 50 mircera 100 q 2 weeks - last dose 11/27 - calcitriol 1- no Fe right upper AVGG Recent labs: hgb 7.7 dropping 11% sat, no ferritin, no K ipTH 461   Problem/Plan:  1. Clotted RUA AVG: unfortunately could not be declotted, R IJ TDC replaced, will need eval for access once antibiotics completed. 2. MRSA bacteremia thought secondary to catheter sepsis (+ BC and perm cath exit drainage cultures 11/27).  Merwyn Katos cath removed 11/30/1/8.  On IV Vanco.ID seeing, rec 2 weeks of IV vanc (11/30-) TTE without vegetations.  Repeat blood cultures 11/30 negative. Chest [place] without abscess in chest wall 3. ESRD- TTS, K 3.7  Vol ok.  Dialyzing off schedule for clotted RUE AVG 4. Hypertension/volume- BP 105/58not on BP meds - losing weight/ last wt 118 bed wt ,not accurate    5. Anemia- hgb 7.1>6.8> 7.6>8.7 >8.5  12/2given. 1 u PRBCS /Fe def anemia - tsat 11%OP hold fe load sec Bacteremia  -Mircera last dose100 11/30/16  - next would be scheduled 12/14/16 increase 200  6. Metabolic bone disease- Continue calcitriol, sensipar 90, calcium acetate 7. Nutrition- alb 3.1holding  nepro due to diarrhea - now eating  Better   8. Hx DVT - was on Eliqu[place] admit /had been on coumadin in [place]. 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 Ramonita Lab holding Eliquis.   10. Hx ovarianvs cervicalcancers/p prior chemo/radiation( In [place]. )but not surgery- "cancer free since 2000" no records available  Madelon Lips MD [place] pgr [phone] 12/10/2016,9:33 AM  LOS: 7 days   Labs: Basic Metabolic Panel: Recent Labs  Lab 12/08/16 0734 12/09/16 0625 12/10/16 0404  NA 137 137 137  K 3.9 3.7 4.2  CL 95* 98* 98*  CO2 28 28 26   GLUCOSE 84 99 94  BUN 9 18 25*  CREATININE 4.47* 6.74* 8.12*  CALCIUM 8.0* 8.6* 9.1   Liver Function Tests: Recent Labs  Lab 12/03/16 1818  AST 41  ALT 21  ALKPHOS 137*  BILITOT 0.5  PROT 8.1  ALBUMIN 3.1*   No results for input(s): LIPASE, AMYLASE in the last 168 hours. No results for input(s): AMMONIA in the last 168 hours. CBC: Recent Labs  Lab 12/03/16 1818  12/06/16 0612 12/07/16 0509 12/08/16 0734 12/09/16 0625 12/10/16 0404  WBC 7.7   < > 5.2 7.6 7.0 7.0 7.4  NEUTROABS 5.9  --   --   --   --   --   --  HGB 8.0*   < > 7.6* 8.7* 8.4* 8.5* 8.5*  HCT 25.0*   < > 23.9* 27.1* 27.2* 27.8* 27.6*  MCV 97.3   < > 97.6 98.9 101.1* 100.0 101.1*  PLT 314   < > 290 343 351 334 326   < > = values in this interval not displayed.   Cardiac Enzymes: No results for input(s): CKTOTAL, CKMB, CKMBINDEX, TROPONINI in the last 168 hours. CBG: No results for input(s): GLUCAP in the last 168 hours.  Studies/Results: Ir Thrombectomy Av Fistula W/thrombolysis/pta/stent Inc Shunt/img Rt  Result Date: 12/09/2016 INDICATION: 51 year old female with a history of a right upper extremity dialysis graft, placed earlier this year in [place] before she relocated to [place]. This has recently been initiated with dialysis and was working fine. She is now admitted to [facility] with MRSA bacteremia,  with removal of the prior catheter. She has positive blood cultures. Referred for possible restoration of flow within the dialysis graft. EXAM: RIGHT UPPER EXTREMITY DIALYSIS FISTULAGRAM ATTEMPT AT THROMBECTOMY OF FISTULAGRAM WITH PLACEMENT OF COVERED VENOUS STENT AT OUTFLOW STENOSIS. MEDICATIONS: None. ANESTHESIA/SEDATION: Moderate Sedation Time:  105 MINUTES The patient was continuously monitored during the procedure by the interventional radiology nurse under my direct supervision. FLUOROSCOPY TIME:  Fluoroscopy Time: 18 minutes 24 seconds (79 mGy). COMPLICATIONS: None PROCEDURE: Informed written consent was obtained from the patient after a thorough discussion of the procedural risks, benefits and alternatives. Specifically, the risk of septic emboli and possible inability to open the graft given the infection were discussed with the patient, who voiced her understanding. All questions were addressed. Maximal Sterile Barrier Technique was utilized including caps, mask, sterile gowns, sterile gloves, sterile drape, hand hygiene and skin antiseptic. A timeout was performed prior to the initiation of the procedure. Patient positioned supine position on the fluoroscopy table. The right upper extremity was prepped and draped in the usual sterile fashion. Ultrasound images were acquired of the graft. Ultrasound guided access was then used to puncture the graft in the presumed venous outflow direction after 1% lidocaine was used for local anesthesia. Using Seldinger technique, 6 French sheath was placed. Angled catheter was navigated into the venous outflow, with central venogram performed. Upon withdrawal of the catheter, stenosis was identified in the axillary vein. Approximately 3 mg of tPA solution was then infused through the thrombosed venous outflow. 6 mm balloon mass serration was then performed along the venous outflow, including the stenosis at the venous outflow. 1% lidocaine was then used to anesthetize the  skin over the more proximal graft, and ultrasound-guided access was performed towards the arterial anastomosis. Seldinger technique was used to place a 6 [place] sheath. Catheter was navigated into the axillary artery and an angiogram was performed confirming occlusion at the graft at the anastomosis. The remainder of the tPA solution with a total 1 mg in solution was infused into the proximal aspect of the graft. Balloon mass serration was then performed along the proximal aspect. Over the wire Fogarty catheter was passed the arterial anastomosis, balloon was inflated, and withdrawn across the arterial anastomosis on 2 separate occasion. Balloon angioplasty was then performed of the arterial anastomosis to 6 mm. Flow was partially restored though with residual thrombus within the graft. Persisting stenosis at the venous outflow was then treated to 9 mm diameter after the sheath was replaced with a 7 [place] sheath. Balloon mass serration with the 6 mm balloon was then performed along the length of the graft once more. Flow was  temporarily restored at this point after the venous outflow was treated to 9 mm. Flow was then again occluded with recurrent thrombus along the length of the graft. Balloon angioplasty along the length of the graft identified no recurrent stenoses. Adherent thrombus at the venous outflow was identified, and then treated with a covered 7 mm x 40 mm flared stent. Catheter was advanced to the arterial aspect, repeat angiogram was performed confirming diffuse thrombus through the length of the graft with no recurrent stenosis. Final images demonstrate that flow was not restored. Pursestring sutures were placed at the access sites and the procedure was terminated. Patient tolerated the procedure well and remained hemodynamically stable throughout. No complications were encountered and no significant blood loss. IMPRESSION: Status post right upper extremity fistulogram and failed attempt at  restoration of flow through the dialysis graft, likely secondary to infected graft. Hemodialysis catheter will be placed. Signed, Dulcy Fanny. Earleen Newport, DO [facility] Radiology ACCESS: The right upper extremity access flow was not restored, likely secondary to infection, and is likely not amenable to further intervention. Electronically Signed   By: Corrie Mckusick D.O.   On: 12/09/2016 19:04   Medications: .  ceFAZolin (ANCEF) IV    . [START ON 12/11/2016] vancomycin    . vancomycin     . calcitRIOL  1 mcg Oral Q M,W,F-HD  . calcium acetate  1,334 mg Oral TID WC  . cinacalcet  90 mg Oral Q breakfast  . [START ON 12/14/2016] darbepoetin (ARANESP) injection - DIALYSIS  200 mcg Intravenous Q Tue-HD  . multivitamin  1 tablet Oral QHS  . nystatin   Topical TID  . sertraline  50 mg Oral Daily

## 2016-12-10 NOTE — Procedures (Signed)
Patient seen and examined on Hemodialysis. QB 315 via R IJ TDC, UF goal 1.5L.  Tolerating treatment well.  Treatment adjusted as needed.  Madelon Lips MD [facility] pgr [phone] 9:42 AM

## 2016-12-10 NOTE — Progress Notes (Signed)
PT Cancellation Note  Patient Details Name: Judy Kelley MRN: [id number] DOB: 04/09/1965   Cancelled Treatment:    Reason Eval/Treat Not Completed: Patient at procedure or test/unavailable. Pt at HD. PT will continue to f/u with pt as available.    [place] 12/10/2016, 8:19 AM

## 2016-12-10 NOTE — Progress Notes (Signed)
PROGRESS NOTE    Darlyn Repsher  IRS:[id number] DOB: 1965-08-23 DOA: 12/03/2016 PCP: System, Pcp Not In     Brief Narrative:  [name] a 51 y.o.femalewithhistory of ESRD on hemodialysis, history of DVT, previous history of ovarian cancer in remission who had just recently moved from [place] was found to have redness around her right subclavian dialysis catheter 3 days prior to admission and had associated fever and chills. Blood cultures were obtained at dialysis center and patient was empirically started on vancomycin. Patient had received her second dose of vancomycin at dialysis center. Cultures came back as staph aureus and was instructed to come to the ER. She was found to have MRSA bacteremia and right subclavian dialysis catheter was removed. ID has been following. She also had bleeding from ileal conduit stoma for which urology has been consulted. She underwent endoscopy of ileal conduit on 12/6 which did not reveal source of bleed, but revealed strictures of ileal conduit.    Assessment & Plan:   Principal Problem:   Bacteremia Active Problems:   ESRD on dialysis ([place])   Hypokalemia   History of ovarian cancer   MRSA bacteremia  MRSA bacteremia  -Likely secondary to dialysis catheter (right subclavian) that was removed 11/30  -Initial blood cultures obtained 11/27 as an outpatient, per nephrology showed to be MRSA -Blood cultures 11/30 negative at 5 days  -Echocardiogram: EF 60-65%, grade 2DD, no mention of valvular involvement  -[place] chest: No evidence of abscess within the right anterior chest wall -Infectious disease consulted and appreciated- recommended 2 weeks of IV vanc with HD. 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 Spoke with Dr. Jeffie Pollock post-operatively. Plan to hold anticoagulation for few days and watch for bleed. Plan to resume Eliquis on 12/9 if continues to be stable   ESRD -Nephrology consulted and appreciated -HD TTS  Clotted AV graft RUE -IR consulted for stent of stenosis with recurrent diffuse thrombosis. Now with RIJ tunneled HD catheter   Diarrhea -C. difficile negative -Resolved   History of DVT -Was on Eliquis at home -Holding anticoagulation as above. Plan to resume Eliquis on 12/9 if continues to be stable   History of ovarian cancer -Currently in remission  Normocytic normochromic anemia/anemia of chronic renal disease -Hemoglobin dropped to 6.8, received 1uPRBC -Hemoglobin now stable   Dermatitis/rash -Appears to be more fungal vs pityriasis rosea (this would be self limited with only symptomatic treatment), only on flanks and B/L -Continue nystatin powder, sarna and continue to monitor   Morbid obesity  -BMI 40 -Discuss weight management with PCP on discharge   DVT prophylaxis: Hold Eliquis  Code Status: Full Family Communication: No family at bedside Disposition Plan: pending    Consultants:   Nephrology  ID  Urology  IR   Antimicrobials:  Anti-infectives (From admission, onward)   Start     Dose/Rate Route Frequency Ordered Stop   12/11/16 1200  vancomycin (VANCOCIN) IVPB 1000 mg/200 mL premix     1,000 mg 200 mL/hr over 60 Minutes Intravenous Every T-Th-Sa (Hemodialysis) 12/09/16 1151     12/10/16 1200  vancomycin (VANCOCIN) IVPB 1000 mg/200 mL premix     1,000 mg 200 mL/hr over  60 Minutes Intravenous Every M-W-F (Hemodialysis) 12/09/16 1151 12/10/16 1302   12/09/16 1630  ceFAZolin (ANCEF) IVPB 2g/100 mL premix     2 g 200 mL/hr over 30 Minutes Intravenous To [facility] 12/09/16 1617 12/10/16 1630   12/09/16  1613  ceFAZolin (ANCEF) 2-4 GM/100ML-% IVPB    Comments:  Desiree Hane   : cabinet override      12/09/16 1613 12/09/16 1623   12/09/16 1200  vancomycin (VANCOCIN) IVPB 1000 mg/200 mL premix  Status:  Discontinued     1,000 mg 200 mL/hr over 60 Minutes Intravenous Every T-Th-Sa (Hemodialysis) 12/08/16 1136 12/09/16 1151   12/08/16 1230  vancomycin (VANCOCIN) IVPB 1000 mg/200 mL premix     1,000 mg 200 mL/hr over 60 Minutes Intravenous  Once 12/08/16 1136 12/08/16 1353   12/06/16 1736  vancomycin (VANCOCIN) 1-5 GM/200ML-% IVPB    Comments:  Cherylann Banas   : cabinet override      12/06/16 1736 12/07/16 0544   12/06/16 1200  vancomycin (VANCOCIN) IVPB 1000 mg/200 mL premix  Status:  Discontinued     1,000 mg 200 mL/hr over 60 Minutes Intravenous Every M-W-F (Hemodialysis) 12/04/16 1708 12/08/16 1136       Subjective: Doing well post HD. Eating lunch now.    Objective: Vitals:   12/10/16 1200 12/10/16 1215 12/10/16 1224 12/10/16 1256  BP: (!) 91/52 (!) 84/52 107/64 (!) 111/59  Pulse: (!) 52 (!) 52 (!) 52 (!) 58  Resp:   13 16  Temp:   97.7 F (36.5 C) 98.2 F (36.8 C)  TempSrc:   Oral Oral  SpO2:   96% 98%  Weight:   115 kg (253 lb 8.5 oz)   Height:        Intake/Output Summary (Last 24 hours) at 12/10/2016 1349 Last data filed at 12/10/2016 1302 Gross per 24 hour  Intake 1120 ml  Output 1392 ml  Net -272 ml   Filed Weights   12/09/16 2049 12/10/16 0755 12/10/16 1224  Weight: 116 kg (255 lb 11.7 oz) 115.8 kg (255 lb 4.7 oz) 115 kg (253 lb 8.5 oz)    Examination:  General exam: Appears calm and comfortable  Respiratory system: Clear to auscultation. Respiratory effort normal. Cardiovascular system: S1 & S2 heard, RRR. No JVD, murmurs, rubs, gallops or clicks. No pedal edema. Gastrointestinal system: Abdomen is nondistended, soft and nontender. No organomegaly or masses felt. Normal bowel sounds heard. Central nervous system: Alert and oriented. No focal  neurological deficits. Extremities: Symmetric  Skin: No rashes, lesions or ulcers. Stoma with minimal bleed  Psychiatry: Judgement and insight appear normal. Mood & affect appropriate.   Data Reviewed: I have personally reviewed following labs and imaging studies  CBC: Recent Labs  Lab 12/03/16 1818  12/06/16 0612 12/07/16 0509 12/08/16 0734 12/09/16 0625 12/10/16 0404  WBC 7.7   < > 5.2 7.6 7.0 7.0 7.4  NEUTROABS 5.9  --   --   --   --   --   --   HGB 8.0*   < > 7.6* 8.7* 8.4* 8.5* 8.5*  HCT 25.0*   < > 23.9* 27.1* 27.2* 27.8* 27.6*  MCV 97.3   < > 97.6 98.9 101.1* 100.0 101.1*  PLT 314   < > 290 343 351 334 326   < > = values in this interval not displayed.   Basic Metabolic Panel: Recent Labs  Lab 12/05/16 0502 12/06/16 0612 12/07/16 0509 12/08/16 0734 12/09/16 0625 12/10/16 0404  NA 137  136 136 137 137 137  K 3.8 3.5 3.8 3.9 3.7 4.2  CL 97* 97* 98* 95* 98* 98*  CO2 27 26 26 28 28 26   GLUCOSE 79 82 85 84 99 94  BUN 32* 41* 18 9 18  25*  CREATININE 7.95* 9.58* 5.81* 4.47* 6.74* 8.12*  CALCIUM 8.7* 8.2* 8.2* 8.0* 8.6* 9.1  MG 2.3  --   --   --   --   --    GFR: Estimated Creatinine Clearance: 10.6 mL/min (A) (by C-G formula based on SCr of 8.12 mg/dL (H)). Liver Function Tests: Recent Labs  Lab 12/03/16 1818  AST 41  ALT 21  ALKPHOS 137*  BILITOT 0.5  PROT 8.1  ALBUMIN 3.1*   No results for input(s): LIPASE, AMYLASE in the last 168 hours. No results for input(s): AMMONIA in the last 168 hours. Coagulation Profile: Recent Labs  Lab 12/03/16 1818  INR 1.14   Cardiac Enzymes: No results for input(s): CKTOTAL, CKMB, CKMBINDEX, TROPONINI in the last 168 hours. BNP (last 3 results) No results for input(s): PROBNP in the last 8760 hours. HbA1C: No results for input(s): HGBA1C in the last 72 hours. CBG: No results for input(s): GLUCAP in the last 168 hours. Lipid Profile: No results for input(s): CHOL, HDL, LDLCALC, TRIG, CHOLHDL, LDLDIRECT in the last  72 hours. Thyroid Function Tests: No results for input(s): TSH, T4TOTAL, FREET4, T3FREE, THYROIDAB in the last 72 hours. Anemia Panel: No results for input(s): VITAMINB12, FOLATE, FERRITIN, TIBC, IRON, RETICCTPCT in the last 72 hours. Sepsis Labs: Recent Labs  Lab 12/03/16 1843 12/03/16 2138  LATICACIDVEN 2.77* 2.24*    Recent Results (from the past 240 hour(s))  Culture, blood (Routine x 2)     Status: None   Collection Time: 12/03/16  6:20 PM  Result Value Ref Range Status   Specimen Description BLOOD LEFT HAND  Final   Special Requests   Final    Blood Culture adequate volume BOTTLES DRAWN AEROBIC AND ANAEROBIC   Culture NO GROWTH 5 DAYS  Final   Report Status 12/08/2016 FINAL  Final  Culture, blood (Routine x 2)     Status: None   Collection Time: 12/03/16  9:28 PM  Result Value Ref Range Status   Specimen Description BLOOD LEFT ANTECUBITAL  Final   Special Requests   Final    Blood Culture adequate volume BOTTLES DRAWN AEROBIC ONLY   Culture NO GROWTH 5 DAYS  Final   Report Status 12/08/2016 FINAL  Final  MRSA PCR Screening     Status: Abnormal   Collection Time: 12/04/16  1:51 AM  Result Value Ref Range Status   MRSA by PCR POSITIVE (A) NEGATIVE Final    Comment:        The GeneXpert MRSA Assay (FDA approved for NASAL specimens only), is one component of a comprehensive MRSA colonization surveillance program. It is not intended to diagnose MRSA infection nor to guide or monitor treatment for MRSA infections. RESULT CALLED TO, READ BACK BY AND VERIFIED WITH: Fulton Reek RN [phone] 12/04/16 A BROWNING   C difficile quick scan w PCR reflex     Status: None   Collection Time: 12/04/16 10:45 AM  Result Value Ref Range Status   C Diff antigen NEGATIVE NEGATIVE Final   C Diff toxin NEGATIVE NEGATIVE Final   C Diff interpretation No C. difficile detected.  Final  Culture, Urine     Status: None   Collection Time: 12/04/16 10:46 AM  Result Value  Ref Range Status    Specimen Description URINE, RANDOM  Final   Special Requests NONE  Final   Culture NO GROWTH  Final   Report Status 12/05/2016 FINAL  Final       Radiology Studies: Ir Fluoro Guide Cv Line Right  Result Date: 12/10/2016 CLINICAL DATA:  End-stage renal disease and failed attempt at restoring flow in an occluded right upper arm dialysis graft. EXAM: TUNNELED CENTRAL VENOUS HEMODIALYSIS CATHETER PLACEMENT WITH ULTRASOUND AND FLUOROSCOPIC GUIDANCE ANESTHESIA/SEDATION: 1.0 mg IV Versed; 25 mcg IV Fentanyl. Total Moderate Sedation Time:  20  minutes. The patient's level of consciousness and physiologic status were continuously monitored during the procedure by Radiology nursing. MEDICATIONS: 2 g IV Ancef. As antibiotic prophylaxis, Ancef was ordered pre-procedure and administered intravenously within one hour of incision. FLUOROSCOPY TIME:  36 seconds.  3.2 mGy. PROCEDURE: The procedure, risks, benefits, and alternatives were explained to the patient. Questions regarding the procedure were encouraged and answered. The patient understands and consents to the procedure. A timeout was performed prior to initiating the procedure. The right neck and chest were prepped with chlorhexidine in a sterile fashion, and a sterile drape was applied covering the operative field. Maximum barrier sterile technique with sterile gowns and gloves were used for the procedure. Local anesthesia was provided with 1% lidocaine. Ultrasound was used to confirm patency of the right internal jugular vein. After creating a small venotomy incision, a 21 gauge needle was advanced into the right internal jugular vein under direct, real-time ultrasound guidance. Ultrasound image documentation was performed. After securing guidewire access, an 8 Fr dilator was placed. A J-wire was kinked to measure appropriate catheter length. A Palindrome tunneled hemodialysis catheter measuring 19 cm from tip to cuff was chosen for placement. This was tunneled  in a retrograde fashion from the chest wall to the venotomy incision. At the venotomy, serial dilatation was performed and a 16 Fr peel-away sheath was placed over a guidewire. The catheter was then placed through the sheath and the sheath removed. Final catheter positioning was confirmed and documented with a fluoroscopic spot image. The catheter was aspirated, flushed with saline, and injected with appropriate volume heparin dwells. The venotomy incision was closed with subcutaneous 3-0 Monocryl and subcuticular 4-0 Vicryl. Dermabond was applied to the incision. The catheter exit site was secured with 0-Prolene retention sutures. COMPLICATIONS: None.  No pneumothorax. FINDINGS: After catheter placement, the tip lies in the right atrium. The catheter aspirates normally and is ready for immediate use. IMPRESSION: Placement of tunneled hemodialysis catheter via the right internal jugular vein. The catheter tip lies in the right atrium. The catheter is ready for immediate use. Electronically Signed   By: Aletta Edouard M.D.   On: 12/10/2016 11:14   Ir US Guide Vasc Access Right  Result Date: 12/10/2016 CLINICAL DATA:  End-stage renal disease and failed attempt at restoring flow in an occluded right upper arm dialysis graft. EXAM: TUNNELED CENTRAL VENOUS HEMODIALYSIS CATHETER PLACEMENT WITH ULTRASOUND AND FLUOROSCOPIC GUIDANCE ANESTHESIA/SEDATION: 1.0 mg IV Versed; 25 mcg IV Fentanyl. Total Moderate Sedation Time:  20  minutes. The patient's level of consciousness and physiologic status were continuously monitored during the procedure by Radiology nursing. MEDICATIONS: 2 g IV Ancef. As antibiotic prophylaxis, Ancef was ordered pre-procedure and administered intravenously within one hour of incision. FLUOROSCOPY TIME:  36 seconds.  3.2 mGy. PROCEDURE: The procedure, risks, benefits, and alternatives were explained to the patient. Questions regarding the procedure were encouraged and answered. The patient  understands  and consents to the procedure. A timeout was performed prior to initiating the procedure. The right neck and chest were prepped with chlorhexidine in a sterile fashion, and a sterile drape was applied covering the operative field. Maximum barrier sterile technique with sterile gowns and gloves were used for the procedure. Local anesthesia was provided with 1% lidocaine. Ultrasound was used to confirm patency of the right internal jugular vein. After creating a small venotomy incision, a 21 gauge needle was advanced into the right internal jugular vein under direct, real-time ultrasound guidance. Ultrasound image documentation was performed. After securing guidewire access, an 8 Fr dilator was placed. A J-wire was kinked to measure appropriate catheter length. A Palindrome tunneled hemodialysis catheter measuring 19 cm from tip to cuff was chosen for placement. This was tunneled in a retrograde fashion from the chest wall to the venotomy incision. At the venotomy, serial dilatation was performed and a 16 Fr peel-away sheath was placed over a guidewire. The catheter was then placed through the sheath and the sheath removed. Final catheter positioning was confirmed and documented with a fluoroscopic spot image. The catheter was aspirated, flushed with saline, and injected with appropriate volume heparin dwells. The venotomy incision was closed with subcutaneous 3-0 Monocryl and subcuticular 4-0 Vicryl. Dermabond was applied to the incision. The catheter exit site was secured with 0-Prolene retention sutures. COMPLICATIONS: None.  No pneumothorax. FINDINGS: After catheter placement, the tip lies in the right atrium. The catheter aspirates normally and is ready for immediate use. IMPRESSION: Placement of tunneled hemodialysis catheter via the right internal jugular vein. The catheter tip lies in the right atrium. The catheter is ready for immediate use. Electronically Signed   By: Aletta Edouard M.D.   On:  12/10/2016 11:14   Ir US Guide Vasc Access Right  Result Date: 12/10/2016 INDICATION: 51 year old female with a history of a right upper extremity dialysis graft, placed earlier this year in [place] before she relocated to [place]. This has recently been initiated with dialysis and was working fine. She is now admitted to [facility] with MRSA bacteremia, with removal of the prior catheter. She has positive blood cultures. Referred for possible restoration of flow within the dialysis graft. EXAM: RIGHT UPPER EXTREMITY DIALYSIS FISTULAGRAM ATTEMPT AT THROMBECTOMY OF FISTULAGRAM WITH PLACEMENT OF COVERED VENOUS STENT AT OUTFLOW STENOSIS. MEDICATIONS: None. ANESTHESIA/SEDATION: Moderate Sedation Time:  105 MINUTES The patient was continuously monitored during the procedure by the interventional radiology nurse under my direct supervision. FLUOROSCOPY TIME:  Fluoroscopy Time: 18 minutes 24 seconds (79 mGy). COMPLICATIONS: None PROCEDURE: Informed written consent was obtained from the patient after a thorough discussion of the procedural risks, benefits and alternatives. Specifically, the risk of septic emboli and possible inability to open the graft given the infection were discussed with the patient, who voiced her understanding. All questions were addressed. Maximal Sterile Barrier Technique was utilized including caps, mask, sterile gowns, sterile gloves, sterile drape, hand hygiene and skin antiseptic. A timeout was performed prior to the initiation of the procedure. Patient positioned supine position on the fluoroscopy table. The right upper extremity was prepped and draped in the usual sterile fashion. Ultrasound images were acquired of the graft. Ultrasound guided access was then used to puncture the graft in the presumed venous outflow direction after 1% lidocaine was used for local anesthesia. Using Seldinger technique, 6 French sheath was placed. Angled catheter was navigated into the venous  outflow, with central venogram performed. Upon withdrawal of the catheter, stenosis was identified  in the axillary vein. Approximately 3 mg of tPA solution was then infused through the thrombosed venous outflow. 6 mm balloon mass serration was then performed along the venous outflow, including the stenosis at the venous outflow. 1% lidocaine was then used to anesthetize the skin over the more proximal graft, and ultrasound-guided access was performed towards the arterial anastomosis. Seldinger technique was used to place a 6 [place] sheath. Catheter was navigated into the axillary artery and an angiogram was performed confirming occlusion at the graft at the anastomosis. The remainder of the tPA solution with a total 1 mg in solution was infused into the proximal aspect of the graft. Balloon mass serration was then performed along the proximal aspect. Over the wire Fogarty catheter was passed the arterial anastomosis, balloon was inflated, and withdrawn across the arterial anastomosis on 2 separate occasion. Balloon angioplasty was then performed of the arterial anastomosis to 6 mm. Flow was partially restored though with residual thrombus within the graft. Persisting stenosis at the venous outflow was then treated to 9 mm diameter after the sheath was replaced with a 7 [place] sheath. Balloon mass serration with the 6 mm balloon was then performed along the length of the graft once more. Flow was temporarily restored at this point after the venous outflow was treated to 9 mm. Flow was then again occluded with recurrent thrombus along the length of the graft. Balloon angioplasty along the length of the graft identified no recurrent stenoses. Adherent thrombus at the venous outflow was identified, and then treated with a covered 7 mm x 40 mm flared stent. Catheter was advanced to the arterial aspect, repeat angiogram was performed confirming diffuse thrombus through the length of the graft with no recurrent stenosis.  Final images demonstrate that flow was not restored. Pursestring sutures were placed at the access sites and the procedure was terminated. Patient tolerated the procedure well and remained hemodynamically stable throughout. No complications were encountered and no significant blood loss. 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 Hemodialysis catheter will be placed. Signed, Dulcy Fanny. Earleen Newport, DO [facility] Radiology ACCESS: The right upper extremity access flow was not restored, likely secondary to infection, and is likely not amenable to further intervention. Electronically Signed   By: Corrie Mckusick D.O.   On: 12/09/2016 19:04   Ir Thrombectomy Av Fistula W/thrombolysis/pta/stent Inc Shunt/img Rt  Result Date: 12/09/2016 INDICATION: 51 year old female with a history of a right upper extremity dialysis graft, placed earlier this year in [place] before she relocated to [place]. This has recently been initiated with dialysis and was working fine. She is now admitted to [facility] with MRSA bacteremia, with removal of the prior catheter. She has positive blood cultures. Referred for possible restoration of flow within the dialysis graft. EXAM: RIGHT UPPER EXTREMITY DIALYSIS FISTULAGRAM ATTEMPT AT THROMBECTOMY OF FISTULAGRAM WITH PLACEMENT OF COVERED VENOUS STENT AT OUTFLOW STENOSIS. MEDICATIONS: None. ANESTHESIA/SEDATION: Moderate Sedation Time:  105 MINUTES The patient was continuously monitored during the procedure by the interventional radiology nurse under my direct supervision. FLUOROSCOPY TIME:  Fluoroscopy Time: 18 minutes 24 seconds (79 mGy). COMPLICATIONS: None PROCEDURE: Informed written consent was obtained from the patient after a thorough discussion of the procedural risks, benefits and alternatives. Specifically, the risk  of septic emboli and possible inability to open the graft given the infection were discussed with the patient, who voiced her understanding.  All questions were addressed. Maximal Sterile Barrier Technique was utilized including caps, mask, sterile gowns, sterile gloves, sterile drape, hand hygiene and skin antiseptic. A timeout was performed prior to the initiation of the procedure. Patient positioned supine position on the fluoroscopy table. The right upper extremity was prepped and draped in the usual sterile fashion. Ultrasound images were acquired of the graft. Ultrasound guided access was then used to puncture the graft in the presumed venous outflow direction after 1% lidocaine was used for local anesthesia. Using Seldinger technique, 6 French sheath was placed. Angled catheter was navigated into the venous outflow, with central venogram performed. Upon withdrawal of the catheter, stenosis was identified in the axillary vein. Approximately 3 mg of tPA solution was then infused through the thrombosed venous outflow. 6 mm balloon mass serration was then performed along the venous outflow, including the stenosis at the venous outflow. 1% lidocaine was then used to anesthetize the skin over the more proximal graft, and ultrasound-guided access was performed towards the arterial anastomosis. Seldinger technique was used to place a 6 [place] sheath. Catheter was navigated into the axillary artery and an angiogram was performed confirming occlusion at the graft at the anastomosis. The remainder of the tPA solution with a total 1 mg in solution was infused into the proximal aspect of the graft. Balloon mass serration was then performed along the proximal aspect. Over the wire Fogarty catheter was passed the arterial anastomosis, balloon was inflated, and withdrawn across the arterial anastomosis on 2 separate occasion. Balloon angioplasty was then performed of the arterial anastomosis to 6 mm. Flow was partially  restored though with residual thrombus within the graft. Persisting stenosis at the venous outflow was then treated to 9 mm diameter after the sheath was replaced with a 7 [place] sheath. Balloon mass serration with the 6 mm balloon was then performed along the length of the graft once more. Flow was temporarily restored at this point after the venous outflow was treated to 9 mm. Flow was then again occluded with recurrent thrombus along the length of the graft. Balloon angioplasty along the length of the graft identified no recurrent stenoses. Adherent thrombus at the venous outflow was identified, and then treated with a covered 7 mm x 40 mm flared stent. Catheter was advanced to the arterial aspect, repeat angiogram was performed confirming diffuse thrombus through the length of the graft with no recurrent stenosis. Final images demonstrate that flow was not restored. Pursestring sutures were placed at the access sites and the procedure was terminated. Patient tolerated the procedure well and remained hemodynamically stable throughout. No complications were encountered and no significant blood loss. 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 Hemodialysis catheter will be placed. Signed, Dulcy Fanny. Earleen Newport, DO [facility] Radiology ACCESS: The right upper extremity access flow was not restored, likely secondary to infection, and is likely not amenable to further intervention. Electronically Signed   By: Corrie Mckusick D.O.   On: 12/09/2016 19:04      Scheduled Meds: . calcitRIOL  1 mcg Oral Q M,W,F-HD  . calcium acetate  1,334 mg Oral TID WC  . cinacalcet  90 mg Oral Q breakfast  . [START ON 12/14/2016] darbepoetin (ARANESP) injection - DIALYSIS  200 mcg Intravenous Q Tue-HD  . multivitamin  1 tablet Oral QHS  . nystatin   Topical TID  .  sertraline  50  mg Oral Daily   Continuous Infusions: .  ceFAZolin (ANCEF) IV    . [START ON 12/11/2016] vancomycin       LOS: 7 days    Time spent: 30 minutes   Dessa Phi, DO [facility] [email or web address] Password [email or web address] 12/10/2016, 1:49 PM

## 2016-12-11 LAB — BASIC METABOLIC PANEL
ANION GAP: 12 (ref 5–15)
BUN: 22 mg/dL — ABNORMAL HIGH (ref 6–20)
CALCIUM: 9 mg/dL (ref 8.9–10.3)
CO2: 25 mmol/L (ref 22–32)
Chloride: 99 mmol/L — ABNORMAL LOW (ref 101–111)
Creatinine, Ser: 5.84 mg/dL — ABNORMAL HIGH (ref 0.44–1.00)
GFR calc Af Amer: 9 mL/min — ABNORMAL LOW (ref >60)
GFR, EST NON AFRICAN AMERICAN: 8 mL/min — AB (ref >60)
GLUCOSE: 93 mg/dL (ref 65–99)
POTASSIUM: 4.5 mmol/L (ref 3.5–5.1)
SODIUM: 136 mmol/L (ref 135–145)

## 2016-12-11 LAB — CBC
HCT: 26.7 % — ABNORMAL LOW (ref 36.0–46.0)
Hemoglobin: 8.3 g/dL — ABNORMAL LOW (ref 12.0–15.0)
MCH: 30.9 pg (ref 26.0–34.0)
MCHC: 31.1 g/dL (ref 30.0–36.0)
MCV: 99.3 fL (ref 78.0–100.0)
PLATELETS: 274 10*3/uL (ref 150–400)
RBC: 2.69 MIL/uL — AB (ref 3.87–5.11)
RDW: 14.3 % (ref 11.5–15.5)
WBC: 7.7 10*3/uL (ref 4.0–10.5)

## 2016-12-11 NOTE — Progress Notes (Signed)
PROGRESS NOTE    Judy Kelley  XQJ:[id number] DOB: [birth year] DOA: 12/03/2016 PCP: System, Pcp Not In     Brief Narrative:  [name] a 51 y.o.femalewithhistory of ESRD on hemodialysis, history of DVT, previous history of ovarian cancer in remission who had just recently moved from [place] was found to have redness around her right subclavian dialysis catheter 3 days prior to admission and had associated fever and chills. Blood cultures were obtained at dialysis center and patient was empirically started on vancomycin. Patient had received her second dose of vancomycin at dialysis center. Cultures came back as staph aureus and was instructed to come to the ER. She was found to have MRSA bacteremia and right subclavian dialysis catheter was removed. ID has been following. She also had bleeding from ileal conduit stoma for which urology has been consulted. She underwent endoscopy of ileal conduit on 12/6 which did not reveal source of bleed, but revealed strictures of ileal conduit.    Assessment & Plan:   Principal Problem:   Bacteremia Active Problems:   ESRD on dialysis ([facility])   Hypokalemia   History of ovarian cancer   MRSA bacteremia  MRSA bacteremia  -Likely secondary to dialysis catheter (right subclavian) that was removed 11/30  -Initial blood cultures obtained 11/27 as an outpatient, per nephrology showed to be MRSA -Blood cultures 11/30 negative at 5 days  -Echocardiogram: EF 60-65%, grade 2DD, no mention of valvular involvement  -[place] chest: No evidence of abscess within the right anterior chest wall -Infectious disease consulted and appreciated- recommended 2 weeks of IV vanc with HD. 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 Spoke with Dr. Jeffie Pollock post-operatively. Plan to hold anticoagulation for few days and watch for bleed. Bleeding increased this morning, continue to watch off anticoagulant   ESRD -Nephrology consulted and appreciated -HD TTS  Clotted AV graft RUE -IR consulted for stent of stenosis with recurrent diffuse thrombosis. Now with RIJ tunneled HD catheter   Diarrhea -C. difficile negative -Resolved   History of DVT -Was on Eliquis at home -Holding anticoagulation as above. Plan to resume Eliquis on 12/9 if stable   History of ovarian cancer -Currently in remission  Normocytic normochromic anemia/anemia of chronic renal disease -Hemoglobin dropped to 6.8, received 1uPRBC -Hemoglobin stable   Dermatitis/rash -Appears to be more fungal vs pityriasis rosea (this would be self limited with only symptomatic treatment), only on flanks and B/L -Continue nystatin powder, sarna and continue to monitor   Morbid obesity  -BMI 40 -Discuss weight management with PCP on discharge   DVT prophylaxis: Hold Eliquis  Code Status: Full Family Communication: No family at bedside Disposition Plan: pending improvement. Symptomatically worse today, nonspecific symptoms. Will watch another night, if stable plan for DC home in AM    Consultants:   Nephrology  ID  Urology  IR   Antimicrobials:  Anti-infectives (From admission, onward)   Start     Dose/Rate Route Frequency Ordered Stop   12/11/16 1200  vancomycin (VANCOCIN) IVPB 1000 mg/200 mL premix     1,000 mg 200 mL/hr over 60 Minutes Intravenous Every T-Th-Sa (Hemodialysis) 12/09/16 1151  12/10/16 1200  vancomycin (VANCOCIN) IVPB 1000 mg/200 mL premix     1,000 mg 200 mL/hr over 60 Minutes Intravenous Every M-W-F (Hemodialysis) 12/09/16 1151 12/10/16 1302    12/09/16 1630  ceFAZolin (ANCEF) IVPB 2g/100 mL premix     2 g 200 mL/hr over 30 Minutes Intravenous To ShortStay Surgical 12/09/16 1617 12/10/16 1630   12/09/16 1613  ceFAZolin (ANCEF) 2-4 GM/100ML-% IVPB    Comments:  Desiree Hane   : cabinet override      12/09/16 1613 12/09/16 1623   12/09/16 1200  vancomycin (VANCOCIN) IVPB 1000 mg/200 mL premix  Status:  Discontinued     1,000 mg 200 mL/hr over 60 Minutes Intravenous Every T-Th-Sa (Hemodialysis) 12/08/16 1136 12/09/16 1151   12/08/16 1230  vancomycin (VANCOCIN) IVPB 1000 mg/200 mL premix     1,000 mg 200 mL/hr over 60 Minutes Intravenous  Once 12/08/16 1136 12/08/16 1353   12/06/16 1736  vancomycin (VANCOCIN) 1-5 GM/200ML-% IVPB    Comments:  Cherylann Banas   : cabinet override      12/06/16 1736 12/07/16 0544   12/06/16 1200  vancomycin (VANCOCIN) IVPB 1000 mg/200 mL premix  Status:  Discontinued     1,000 mg 200 mL/hr over 60 Minutes Intravenous Every M-W-F (Hemodialysis) 12/04/16 1708 12/08/16 1136       Subjective: Not doing well this morning. States she has felt chills and overall not well. No specific complaints of chest pain, shortness of breath, nausea, vomiting.    Objective: Vitals:   12/10/16 1715 12/10/16 2224 12/11/16 0549 12/11/16 0958  BP: 115/63 (!) 121/58 (!) 101/56 (!) 106/56  Pulse: (!) 57 (!) 59 (!) 53 60  Resp: 16 18 16 16   Temp: 98.4 F (36.9 C) 98.1 F (36.7 C) 98.2 F (36.8 C) 98.1 F (36.7 C)  TempSrc: Oral Oral Oral Oral  SpO2: 98% 96% 93% 98%  Weight:  116 kg (255 lb 11.7 oz)    Height:        Intake/Output Summary (Last 24 hours) at 12/11/2016 1317 Last data filed at 12/11/2016 0900 Gross per 24 hour  Intake 360 ml  Output 600 ml  Net -240 ml   Filed Weights   12/10/16 0755 12/10/16 1224 12/10/16 2224  Weight: 115.8 kg (255 lb 4.7 oz) 115 kg (253 lb 8.5 oz) 116 kg (255 lb 11.7 oz)    Examination:  General exam: Appears calm and comfortable, more pale and less energetic than  yesterday  Respiratory system: Clear to auscultation. Respiratory effort normal. Cardiovascular system: S1 & S2 heard, RRR. No JVD, murmurs, rubs, gallops or clicks. No pedal edema. Gastrointestinal system: Abdomen is nondistended, soft and nontender. No organomegaly or masses felt. Normal bowel sounds heard. Central nervous system: Alert and oriented. No focal neurological deficits. Extremities: Symmetric  Skin: No rashes, lesions or ulcers. Stoma with more bleed compared to yesterday   Psychiatry: Judgement and insight appear normal. Mood & affect appropriate.   Data Reviewed: I have personally reviewed following labs and imaging studies  CBC: Recent Labs  Lab 12/07/16 0509 12/08/16 0734 12/09/16 0625 12/10/16 0404 12/11/16 0459  WBC 7.6 7.0 7.0 7.4 7.7  HGB 8.7* 8.4* 8.5* 8.5* 8.3*  HCT 27.1* 27.2* 27.8* 27.6* 26.7*  MCV 98.9 101.1* 100.0 101.1* 99.3  PLT 343 351 334 326 629   Basic Metabolic Panel: Recent Labs  Lab 12/05/16 0502  12/07/16 0509 12/08/16 0734 12/09/16 0625 12/10/16 0404 12/11/16 0459  NA 137   < > 136 137  137 137 136  K 3.8   < > 3.8 3.9 3.7 4.2 4.5  CL 97*   < > 98* 95* 98* 98* 99*  CO2 27   < > 26 28 28 26 25   GLUCOSE 79   < > 85 84 99 94 93  BUN 32*   < > 18 9 18  25* 22*  CREATININE 7.95*   < > 5.81* 4.47* 6.74* 8.12* 5.84*  CALCIUM 8.7*   < > 8.2* 8.0* 8.6* 9.1 9.0  MG 2.3  --   --   --   --   --   --    < > = values in this interval not displayed.   GFR: Estimated Creatinine Clearance: 14.8 mL/min (A) (by C-G formula based on SCr of 5.84 mg/dL (H)). Liver Function Tests: No results for input(s): AST, ALT, ALKPHOS, BILITOT, PROT, ALBUMIN in the last 168 hours. No results for input(s): LIPASE, AMYLASE in the last 168 hours. No results for input(s): AMMONIA in the last 168 hours. Coagulation Profile: No results for input(s): INR, PROTIME in the last 168 hours. Cardiac Enzymes: No results for input(s): CKTOTAL, CKMB, CKMBINDEX, TROPONINI in  the last 168 hours. BNP (last 3 results) No results for input(s): PROBNP in the last 8760 hours. HbA1C: No results for input(s): HGBA1C in the last 72 hours. CBG: No results for input(s): GLUCAP in the last 168 hours. Lipid Profile: No results for input(s): CHOL, HDL, LDLCALC, TRIG, CHOLHDL, LDLDIRECT in the last 72 hours. Thyroid Function Tests: No results for input(s): TSH, T4TOTAL, FREET4, T3FREE, THYROIDAB in the last 72 hours. Anemia Panel: No results for input(s): VITAMINB12, FOLATE, FERRITIN, TIBC, IRON, RETICCTPCT in the last 72 hours. Sepsis Labs: No results for input(s): PROCALCITON, LATICACIDVEN in the last 168 hours.  Recent Results (from the past 240 hour(s))  Culture, blood (Routine x 2)     Status: None   Collection Time: 12/03/16  6:20 PM  Result Value Ref Range Status   Specimen Description BLOOD LEFT HAND  Final   Special Requests   Final    Blood Culture adequate volume BOTTLES DRAWN AEROBIC AND ANAEROBIC   Culture NO GROWTH 5 DAYS  Final   Report Status 12/08/2016 FINAL  Final  Culture, blood (Routine x 2)     Status: None   Collection Time: 12/03/16  9:28 PM  Result Value Ref Range Status   Specimen Description BLOOD LEFT ANTECUBITAL  Final   Special Requests   Final    Blood Culture adequate volume BOTTLES DRAWN AEROBIC ONLY   Culture NO GROWTH 5 DAYS  Final   Report Status 12/08/2016 FINAL  Final  MRSA PCR Screening     Status: Abnormal   Collection Time: 12/04/16  1:51 AM  Result Value Ref Range Status   MRSA by PCR POSITIVE (A) NEGATIVE Final    Comment:        The GeneXpert MRSA Assay (FDA approved for NASAL specimens only), is one component of a comprehensive MRSA colonization surveillance program. It is not intended to diagnose MRSA infection nor to guide or monitor treatment for MRSA infections. RESULT CALLED TO, READ BACK BY AND VERIFIED WITH: Fulton Reek RN [phone] 12/04/16 A BROWNING   C difficile quick scan w PCR reflex     Status: None    Collection Time: 12/04/16 10:45 AM  Result Value Ref Range Status   C Diff antigen NEGATIVE NEGATIVE Final   C Diff toxin NEGATIVE NEGATIVE Final   C  Diff interpretation No C. difficile detected.  Final  Culture, Urine     Status: None   Collection Time: 12/04/16 10:46 AM  Result Value Ref Range Status   Specimen Description URINE, RANDOM  Final   Special Requests NONE  Final   Culture NO GROWTH  Final   Report Status 12/05/2016 FINAL  Final       Radiology Studies: Ir Fluoro Guide Cv Line Right  Result Date: 12/10/2016 CLINICAL DATA:  End-stage renal disease and failed attempt at restoring flow in an occluded right upper arm dialysis graft. EXAM: TUNNELED CENTRAL VENOUS HEMODIALYSIS CATHETER PLACEMENT WITH ULTRASOUND AND FLUOROSCOPIC GUIDANCE ANESTHESIA/SEDATION: 1.0 mg IV Versed; 25 mcg IV Fentanyl. Total Moderate Sedation Time:  20  minutes. The patient's level of consciousness and physiologic status were continuously monitored during the procedure by Radiology nursing. MEDICATIONS: 2 g IV Ancef. As antibiotic prophylaxis, Ancef was ordered pre-procedure and administered intravenously within one hour of incision. FLUOROSCOPY TIME:  36 seconds.  3.2 mGy. PROCEDURE: The procedure, risks, benefits, and alternatives were explained to the patient. Questions regarding the procedure were encouraged and answered. The patient understands and consents to the procedure. A timeout was performed prior to initiating the procedure. The right neck and chest were prepped with chlorhexidine in a sterile fashion, and a sterile drape was applied covering the operative field. Maximum barrier sterile technique with sterile gowns and gloves were used for the procedure. Local anesthesia was provided with 1% lidocaine. Ultrasound was used to confirm patency of the right internal jugular vein. After creating a small venotomy incision, a 21 gauge needle was advanced into the right internal jugular vein under direct,  real-time ultrasound guidance. Ultrasound image documentation was performed. After securing guidewire access, an 8 Fr dilator was placed. A J-wire was kinked to measure appropriate catheter length. A Palindrome tunneled hemodialysis catheter measuring 19 cm from tip to cuff was chosen for placement. This was tunneled in a retrograde fashion from the chest wall to the venotomy incision. At the venotomy, serial dilatation was performed and a 16 Fr peel-away sheath was placed over a guidewire. The catheter was then placed through the sheath and the sheath removed. Final catheter positioning was confirmed and documented with a fluoroscopic spot image. The catheter was aspirated, flushed with saline, and injected with appropriate volume heparin dwells. The venotomy incision was closed with subcutaneous 3-0 Monocryl and subcuticular 4-0 Vicryl. Dermabond was applied to the incision. The catheter exit site was secured with 0-Prolene retention sutures. COMPLICATIONS: None.  No pneumothorax. FINDINGS: After catheter placement, the tip lies in the right atrium. The catheter aspirates normally and is ready for immediate use. IMPRESSION: Placement of tunneled hemodialysis catheter via the right internal jugular vein. The catheter tip lies in the right atrium. The catheter is ready for immediate use. Electronically Signed   By: Aletta Edouard M.D.   On: 12/10/2016 11:14   Ir US Guide Vasc Access Right  Result Date: 12/10/2016 CLINICAL DATA:  End-stage renal disease and failed attempt at restoring flow in an occluded right upper arm dialysis graft. EXAM: TUNNELED CENTRAL VENOUS HEMODIALYSIS CATHETER PLACEMENT WITH ULTRASOUND AND FLUOROSCOPIC GUIDANCE ANESTHESIA/SEDATION: 1.0 mg IV Versed; 25 mcg IV Fentanyl. Total Moderate Sedation Time:  20  minutes. The patient's level of consciousness and physiologic status were continuously monitored during the procedure by Radiology nursing. MEDICATIONS: 2 g IV Ancef. As antibiotic  prophylaxis, Ancef was ordered pre-procedure and administered intravenously within one hour of incision. FLUOROSCOPY TIME:  36 seconds.  3.2 mGy. PROCEDURE: The procedure, risks, benefits, and alternatives were explained to the patient. Questions regarding the procedure were encouraged and answered. The patient understands and consents to the procedure. A timeout was performed prior to initiating the procedure. The right neck and chest were prepped with chlorhexidine in a sterile fashion, and a sterile drape was applied covering the operative field. Maximum barrier sterile technique with sterile gowns and gloves were used for the procedure. Local anesthesia was provided with 1% lidocaine. Ultrasound was used to confirm patency of the right internal jugular vein. After creating a small venotomy incision, a 21 gauge needle was advanced into the right internal jugular vein under direct, real-time ultrasound guidance. Ultrasound image documentation was performed. After securing guidewire access, an 8 Fr dilator was placed. A J-wire was kinked to measure appropriate catheter length. A Palindrome tunneled hemodialysis catheter measuring 19 cm from tip to cuff was chosen for placement. This was tunneled in a retrograde fashion from the chest wall to the venotomy incision. At the venotomy, serial dilatation was performed and a 16 Fr peel-away sheath was placed over a guidewire. The catheter was then placed through the sheath and the sheath removed. Final catheter positioning was confirmed and documented with a fluoroscopic spot image. The catheter was aspirated, flushed with saline, and injected with appropriate volume heparin dwells. The venotomy incision was closed with subcutaneous 3-0 Monocryl and subcuticular 4-0 Vicryl. Dermabond was applied to the incision. The catheter exit site was secured with 0-Prolene retention sutures. COMPLICATIONS: None.  No pneumothorax. FINDINGS: After catheter placement, the tip lies in  the right atrium. The catheter aspirates normally and is ready for immediate use. IMPRESSION: Placement of tunneled hemodialysis catheter via the right internal jugular vein. The catheter tip lies in the right atrium. The catheter is ready for immediate use. Electronically Signed   By: Aletta Edouard M.D.   On: 12/10/2016 11:14   Ir US Guide Vasc Access Right  Result Date: 12/10/2016 INDICATION: 51 year old female with a history of a right upper extremity dialysis graft, placed earlier this year in [place] before she relocated to [place]. This has recently been initiated with dialysis and was working fine. She is now admitted to [facility] with MRSA bacteremia, with removal of the prior catheter. She has positive blood cultures. Referred for possible restoration of flow within the dialysis graft. EXAM: RIGHT UPPER EXTREMITY DIALYSIS FISTULAGRAM ATTEMPT AT THROMBECTOMY OF FISTULAGRAM WITH PLACEMENT OF COVERED VENOUS STENT AT OUTFLOW STENOSIS. MEDICATIONS: None. ANESTHESIA/SEDATION: Moderate Sedation Time:  105 MINUTES The patient was continuously monitored during the procedure by the interventional radiology nurse under my direct supervision. FLUOROSCOPY TIME:  Fluoroscopy Time: 18 minutes 24 seconds (79 mGy). COMPLICATIONS: None PROCEDURE: Informed written consent was obtained from the patient after a thorough discussion of the procedural risks, benefits and alternatives. Specifically, the risk of septic emboli and possible inability to open the graft given the infection were discussed with the patient, who voiced her understanding. All questions were addressed. Maximal Sterile Barrier Technique was utilized including caps, mask, sterile gowns, sterile gloves, sterile drape, hand hygiene and skin antiseptic. A timeout was performed prior to the initiation of the procedure. Patient positioned supine position on the fluoroscopy table. The right upper extremity was prepped and draped in the usual  sterile fashion. Ultrasound images were acquired of the graft. Ultrasound guided access was then used to puncture the graft in the presumed venous outflow direction after 1% lidocaine was used for local anesthesia. Using Seldinger technique,  6 French sheath was placed. Angled catheter was navigated into the venous outflow, with central venogram performed. Upon withdrawal of the catheter, stenosis was identified in the axillary vein. Approximately 3 mg of tPA solution was then infused through the thrombosed venous outflow. 6 mm balloon mass serration was then performed along the venous outflow, including the stenosis at the venous outflow. 1% lidocaine was then used to anesthetize the skin over the more proximal graft, and ultrasound-guided access was performed towards the arterial anastomosis. Seldinger technique was used to place a 6 [place] sheath. Catheter was navigated into the axillary artery and an angiogram was performed confirming occlusion at the graft at the anastomosis. The remainder of the tPA solution with a total 1 mg in solution was infused into the proximal aspect of the graft. Balloon mass serration was then performed along the proximal aspect. Over the wire Fogarty catheter was passed the arterial anastomosis, balloon was inflated, and withdrawn across the arterial anastomosis on 2 separate occasion. Balloon angioplasty was then performed of the arterial anastomosis to 6 mm. Flow was partially restored though with residual thrombus within the graft. Persisting stenosis at the venous outflow was then treated to 9 mm diameter after the sheath was replaced with a 7 [place] sheath. Balloon mass serration with the 6 mm balloon was then performed along the length of the graft once more. Flow was temporarily restored at this point after the venous outflow was treated to 9 mm. Flow was then again occluded with recurrent thrombus along the length of the graft. Balloon angioplasty along the length of the graft  identified no recurrent stenoses. Adherent thrombus at the venous outflow was identified, and then treated with a covered 7 mm x 40 mm flared stent. Catheter was advanced to the arterial aspect, repeat angiogram was performed confirming diffuse thrombus through the length of the graft with no recurrent stenosis. Final images demonstrate that flow was not restored. Pursestring sutures were placed at the access sites and the procedure was terminated. Patient tolerated the procedure well and remained hemodynamically stable throughout. No complications were encountered and no significant blood loss. 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 Hemodialysis catheter will be placed. Signed, Dulcy Fanny. Earleen Newport, DO [facility] Radiology ACCESS: The right upper extremity access flow was not restored, likely secondary to infection, and is likely not amenable to further intervention. Electronically Signed   By: Corrie Mckusick D.O.   On: 12/09/2016 19:04   Ir Thrombectomy Av Fistula W/thrombolysis/pta/stent Inc Shunt/img Rt  Result Date: 12/09/2016 INDICATION: 51 year old female with a history of a right upper extremity dialysis graft, placed earlier this year in [place] before she relocated to [place]. This has recently been initiated with dialysis and was working fine. She is now admitted to [facility] with MRSA bacteremia, with removal of the prior catheter. She has positive blood cultures. Referred for possible restoration of flow within the dialysis graft. EXAM: RIGHT UPPER EXTREMITY DIALYSIS FISTULAGRAM ATTEMPT AT THROMBECTOMY OF FISTULAGRAM WITH PLACEMENT OF COVERED VENOUS STENT AT OUTFLOW STENOSIS. MEDICATIONS: None. ANESTHESIA/SEDATION: Moderate Sedation Time:  105 MINUTES The patient was continuously monitored during the procedure by the  interventional radiology nurse under my direct supervision. FLUOROSCOPY TIME:  Fluoroscopy Time: 18 minutes 24 seconds (79 mGy). COMPLICATIONS: None PROCEDURE: Informed written consent was obtained from the patient after a thorough discussion of the procedural risks, benefits and alternatives.  Specifically, the risk of septic emboli and possible inability to open the graft given the infection were discussed with the patient, who voiced her understanding. All questions were addressed. Maximal Sterile Barrier Technique was utilized including caps, mask, sterile gowns, sterile gloves, sterile drape, hand hygiene and skin antiseptic. A timeout was performed prior to the initiation of the procedure. Patient positioned supine position on the fluoroscopy table. The right upper extremity was prepped and draped in the usual sterile fashion. Ultrasound images were acquired of the graft. Ultrasound guided access was then used to puncture the graft in the presumed venous outflow direction after 1% lidocaine was used for local anesthesia. Using Seldinger technique, 6 French sheath was placed. Angled catheter was navigated into the venous outflow, with central venogram performed. Upon withdrawal of the catheter, stenosis was identified in the axillary vein. Approximately 3 mg of tPA solution was then infused through the thrombosed venous outflow. 6 mm balloon mass serration was then performed along the venous outflow, including the stenosis at the venous outflow. 1% lidocaine was then used to anesthetize the skin over the more proximal graft, and ultrasound-guided access was performed towards the arterial anastomosis. Seldinger technique was used to place a 6 [place] sheath. Catheter was navigated into the axillary artery and an angiogram was performed confirming occlusion at the graft at the anastomosis. The remainder of the tPA solution with a total 1 mg in solution was infused into the proximal aspect of the graft. Balloon mass  serration was then performed along the proximal aspect. Over the wire Fogarty catheter was passed the arterial anastomosis, balloon was inflated, and withdrawn across the arterial anastomosis on 2 separate occasion. Balloon angioplasty was then performed of the arterial anastomosis to 6 mm. Flow was partially restored though with residual thrombus within the graft. Persisting stenosis at the venous outflow was then treated to 9 mm diameter after the sheath was replaced with a 7 [place] sheath. Balloon mass serration with the 6 mm balloon was then performed along the length of the graft once more. Flow was temporarily restored at this point after the venous outflow was treated to 9 mm. Flow was then again occluded with recurrent thrombus along the length of the graft. Balloon angioplasty along the length of the graft identified no recurrent stenoses. Adherent thrombus at the venous outflow was identified, and then treated with a covered 7 mm x 40 mm flared stent. Catheter was advanced to the arterial aspect, repeat angiogram was performed confirming diffuse thrombus through the length of the graft with no recurrent stenosis. Final images demonstrate that flow was not restored. Pursestring sutures were placed at the access sites and the procedure was terminated. Patient tolerated the procedure well and remained hemodynamically stable throughout. No complications were encountered and no significant blood loss. 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 Hemodialysis catheter will be placed. Signed, Dulcy Fanny. Earleen Newport, DO [facility] ACCESS: The right upper extremity access flow was not restored, likely secondary to infection, and is likely not amenable to further intervention. Electronically Signed   By: Corrie Mckusick D.O.   On: 12/09/2016 19:04        Scheduled Meds: . calcitRIOL  1 mcg Oral Q M,W,F-HD  . calcium acetate  1,334 mg Oral TID WC  . cinacalcet  90 mg Oral Q breakfast  . [START ON 12/14/2016] darbepoetin (ARANESP) injection - DIALYSIS  200 mcg Intravenous Q Tue-HD  . feeding supplement (PRO-STAT SUGAR FREE 64)  30 mL Oral TID  . multivitamin  1 tablet Oral QHS  . nystatin   Topical TID  . sertraline  50 mg Oral Daily   Continuous Infusions: . vancomycin       LOS: 8 days    Time spent: 30 minutes   Dessa Phi, DO [facility] [email or web address] Password [email or web address] 12/11/2016, 1:17 PM

## 2016-12-11 NOTE — Progress Notes (Signed)
Pharmacy Antibiotic Note  Judy Kelley is a 51 y.o. female admitted on 12/03/2016 with bacteremia.  Pharmacy has been consulted for vancomycin dosing.  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 Per written records patient brought from HD: blood cx drawn on 11/27 and catheter tip culture from 11/28 + for staph aureus. She was started on vancomycin as an outpatient on 11/27 per notes.  Has been off schedule with HD this past week d/t issues with access- was unable to get declot of fistula and required HD cath placement on 12/6. Went for HD 12/7 (4h @ BFR 300)- received vancomycin after that session. No plans for HD today,   Plan: Vancomycin 1g IV qHD-TTSat resuming on 12/11 (no orders for HD today). F/u HD schedule and tolerance  Plan is to treat for 2 weeks from 11/30 (would make last dose due on 12/13 with HD as this dose will last until HD on 12/15)  Height: 5\' 7"  (170.2 cm) Weight: 255 lb 11.7 oz (116 kg) IBW/kg (Calculated) : 61.6  Temp (24hrs), Avg:98.2 F (36.8 C), Min:98.1 F (36.7 C), Max:98.4 F (36.9 C)  Recent Labs  Lab 12/07/16 0509 12/08/16 0734 12/09/16 0625 12/10/16 0404 12/11/16 0459  WBC 7.6 7.0 7.0 7.4 7.7  CREATININE 5.81* 4.47* 6.74* 8.12* 5.84*    Estimated Creatinine Clearance: 14.8 mL/min (A) (by C-G formula based on SCr of 5.84 mg/dL (H)).    Allergies  Allergen Reactions  . Erythromycin Anaphylaxis  . Ciprofloxacin     IV only-burning to her veins, can take PO  . Chlorhexidine Rash  . Other Rash    chloraprep    Antimicrobials this admission: Vancomycin 11/27 (PTA) >> 12/1 VR = 20   Microbiology results: 12/1 CDiff: neg for toxin and antigen 12/1: UCx: neg 11/30: BCx: neg 11/30 MRSA: PCR positive  11/27 (OSH) BCx: MRSA  11/28 (OSH) cath tip culture: MRSA  Sunday Klos D. Shell Yandow, PharmD, [place] Clinical Pharmacist Clinical Phone for 12/11/2016 until 3:30pm: [phone] If after 3:30pm, please call main pharmacy at [phone] 12/11/2016 2:06 PM

## 2016-12-11 NOTE — Evaluation (Addendum)
Physical Therapy Evaluation Patient Details Name: Judy Kelley MRN: [id number] DOB: 08-19-1965 Today's Date: 12/11/2016   History of Present Illness  Pt is a 51 y/o female admitted secondary to MRSA bacteremia. Pt's R subclavian dialysis catheter was removed on 11/30 with tunneled R IJ HD catheter placed on 12/6. PMH including but not limited to ESRD (HD on T-Th-S), chrnoic R nephrostomy tube and hx of ovarian cancer in the early 2000's (in remission).    Clinical Impression  Pt presented supine in bed with HOB elevated, awake and willing to participate in therapy session. Prior to admission, pt reported that she was independent with all functional mobility and ADLs. Pt lives with a friend who is pretty much available all the time. Pt ambulated in hallway without use of an AD with min guard for safety. Pt limited this session secondary to nausea and emesis x1. Pt's RN was notified. Pt would continue to benefit from skilled physical therapy services at this time while admitted and after d/c to address the below listed limitations in order to improve overall safety and independence with functional mobility.     Follow Up Recommendations Home health PT;Supervision - Intermittent    Equipment Recommendations  None recommended by PT    Recommendations for Other Services       Precautions / Restrictions Precautions Precautions: None Precaution Comments: nephrostomy drain (posterior R) Restrictions Weight Bearing Restrictions: No      Mobility  Bed Mobility Overal bed mobility: Needs Assistance Bed Mobility: Supine to Sit     Supine to sit: Supervision     General bed mobility comments: for safety  Transfers Overall transfer level: Needs assistance Equipment used: None Transfers: Sit to/from Stand Sit to Stand: Supervision         General transfer comment: for safety  Ambulation/Gait Ambulation/Gait assistance: Min guard Ambulation Distance (Feet): 100 Feet Assistive  device: None Gait Pattern/deviations: Step-through pattern Gait velocity: decreased Gait velocity interpretation: Below normal speed for age/gender General Gait Details: mild instability but no overt LOB or need for physical assistance, min gaurd for safety; pt limited secondary to nausea  Stairs            Wheelchair Mobility    Modified Rankin (Stroke Patients Only)       Balance Overall balance assessment: Needs assistance Sitting-balance support: Feet supported Sitting balance-Leahy Scale: Good     Standing balance support: During functional activity;No upper extremity supported Standing balance-Leahy Scale: Good                               Pertinent Vitals/Pain Pain Assessment: 0-10 Pain Score: 6  Pain Location: arm and back Pain Descriptors / Indicators: Sore;Guarding Pain Intervention(s): Monitored during session;Repositioned    Home Living Family/patient expects to be discharged to:: Private residence Living Arrangements: Non-relatives/Friends Available Help at Discharge: Friend(s);Available 24 hours/day Type of Home: Mobile home Home Access: Stairs to enter Entrance Stairs-Rails: Left;Right;Can reach both Entrance Stairs-Number of Steps: 3 Home Layout: One level Home Equipment: None      Prior Function Level of Independence: Independent               Hand Dominance   Dominant Hand: Right    Extremity/Trunk Assessment   Upper Extremity Assessment Upper Extremity Assessment: Overall WFL for tasks assessed    Lower Extremity Assessment Lower Extremity Assessment: Overall WFL for tasks assessed       Communication  Cognition Arousal/Alertness: Awake/alert Behavior During Therapy: Flat affect Overall Cognitive Status: Within Functional Limits for tasks assessed                                        General Comments      Exercises     Assessment/Plan    PT Assessment Patient needs continued  PT services  PT Problem List Decreased activity tolerance;Decreased balance;Decreased mobility;Decreased coordination       PT Treatment Interventions Gait training;Stair training;Functional mobility training;Therapeutic activities;Therapeutic exercise;Neuromuscular re-education;Balance training;Patient/family education    PT Goals (Current goals can be found in the Care Plan section)  Acute Rehab PT Goals Patient Stated Goal: return home PT Goal Formulation: With patient Time For Goal Achievement: 12/25/16 Potential to Achieve Goals: Good    Frequency Min 3X/week   Barriers to discharge        Co-evaluation               AM-PAC PT "6 Clicks" Daily Activity  Outcome Measure Difficulty turning over in bed (including adjusting bedclothes, sheets and blankets)?: None Difficulty moving from lying on back to sitting on the side of the bed? : None Difficulty sitting down on and standing up from a chair with arms (e.g., wheelchair, bedside commode, etc,.)?: A Little Help needed moving to and from a bed to chair (including a wheelchair)?: None Help needed walking in hospital room?: A Little Help needed climbing 3-5 steps with a railing? : A Little 6 Click Score: 21    End of Session   Activity Tolerance: Other (comment)(limited secondary to nausea and emesis x1) Patient left: with call bell/phone within reach;Other (comment)(sitting EOB) Nurse Communication: Mobility status;Other (comment)(pt with one episode of emesis after ambulation) PT Visit Diagnosis: Other abnormalities of gait and mobility (R26.89)    Time: 1344-1401 PT Time Calculation (min) (ACUTE ONLY): 17 min   Charges:   PT Evaluation $PT Eval Moderate Complexity: 1 Mod     PT G Codes:        [place], PT, DPT [place] 12/11/2016, 3:22 PM

## 2016-12-11 NOTE — Anesthesia Postprocedure Evaluation (Signed)
Anesthesia Post Note  Patient: Amsi Grimley  Procedure(s) Performed: ENDOSCOPY OF ILEAL CONDUIT (N/A Abdomen)     Patient location during evaluation: PACU Anesthesia Type: MAC Level of consciousness: awake and alert Pain management: pain level controlled Vital Signs Assessment: post-procedure vital signs reviewed and stable Respiratory status: spontaneous breathing, nonlabored ventilation, respiratory function stable and patient connected to nasal cannula oxygen Cardiovascular status: stable and blood pressure returned to baseline Postop Assessment: no apparent nausea or vomiting Anesthetic complications: no    Last Vitals:  Vitals:   12/11/16 0549 12/11/16 0958  BP: (!) 101/56 (!) 106/56  Pulse: (!) 53 60  Resp: 16 16  Temp: 36.8 C 36.7 C  SpO2: 93% 98%    Last Pain:  Vitals:   12/11/16 1113  TempSrc:   PainSc: 8    Pain Goal: Patients Stated Pain Goal: 2 (12/10/16 0342)               Lyndle Herrlich EDWARD

## 2016-12-11 NOTE — Progress Notes (Signed)
Subjective:   Feeling sleepy today, no c/os. Tolerated HD yesterday with no issues.   Objective Vital signs in last 24 hours: Vitals:   12/10/16 1715 12/10/16 2224 12/11/16 0549 12/11/16 0958  BP: 115/63 (!) 121/58 (!) 101/56 (!) 106/56  Pulse: (!) 57 (!) 59 (!) 53 60  Resp: 16 18 16 16   Temp: 98.4 F (36.9 C) 98.1 F (36.7 C) 98.2 F (36.8 C) 98.1 F (36.7 C)  TempSrc: Oral Oral Oral Oral  SpO2: 98% 96% 93% 98%  Weight:  116 kg (255 lb 11.7 oz)    Height:       Weight change: -0.2 kg (-7.1 oz)  Physical Exam: General:Obese female NAD Heart:RRR ,no m,r,g Lungs:CTA Abdomen:Obese , Bs +, soft , NT, ND ,lower adb dressing dry clean Dialysis Access: persistently clotted RUE AVG, new R IJ TDC placed 12/6 Extremities: no LE edema   Dialysis Orders: [facility] 4 hr EDW 117.5 2 K 2 Ca AVGG heparin 5000 venofer 50 mircera 100 q 2 weeks - last dose 11/27 - calcitriol 1- no Fe right upper AVGG Recent labs: hgb 7.7 dropping 11% sat, no ferritin, no K ipTH 461   Problem/Plan:  1. Clotted RUA AVG: unfortunately could not be declotted, new R IJ TDC placed 12/6.  Will need eval for access once antibiotics completed. 2. MRSA bacteremia thought secondary to catheter sepsis (+ BC and perm cath exit drainage cultures 11/27).  Merwyn Katos cath removed 11/30/1/8.  On IV Vanco.ID seeing, rec 2 weeks of IV vanc (until 12/13)) TTE without vegetations.  Repeat blood cultures 11/30 negative. Chest [place] without abscess in chest wall 3. ESRD- usually TTS, dialyzing off schedule this week  here d/t clotted access. Get back on schedule next week.  4. Hypertension/volume- BP soft, not on meds. Below EDW, losing weight, follow trend  5. Anemia- Hgb up to 8.3 s/p  1U PRBC 12/2 /Fe def anemia - tsat 11%OP hold fe load sec Bacteremia  -Mircera last dose100 11/30/16  - next would be scheduled 12/14/16 increase 200  6. Metabolic bone disease- Continue calcitriol, sensipar 90, calcium  acetate 7. Nutrition- alb 3.1holding Nepro d/t diarrhea, appetite improving  8. 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 Ramonita Lab holding Eliquis.  To restart Eliquis on 12/9.  10. Hx ovarianvs cervicalcancers/p prior chemo/radiation( In [place]. )but not surgery- "cancer free since 2000" no records available  Lynnda Child PA-C [place] Pager [phone] 12/11/2016,12:54 PM  Pt seen, examined and agree w A/P as above.  Kelly Splinter MD [facility] pager [phone]   12/11/2016, 1:40 PM    Labs: Basic Metabolic Panel: Recent Labs  Lab 12/09/16 0625 12/10/16 0404 12/11/16 0459  NA 137 137 136  K 3.7 4.2 4.5  CL 98* 98* 99*  CO2 28 26 25   GLUCOSE 99 94 93  BUN 18 25* 22*  CREATININE 6.74* 8.12* 5.84*  CALCIUM 8.6* 9.1 9.0   Liver Function Tests: No results for input(s): AST, ALT, ALKPHOS, BILITOT, PROT, ALBUMIN in the last 168 hours. No results for input(s): LIPASE, AMYLASE in the last 168 hours. No results for input(s): AMMONIA in the last 168 hours. CBC: Recent Labs  Lab 12/07/16 0509 12/08/16 0734 12/09/16 0625 12/10/16 0404 12/11/16 0459  WBC  7.6 7.0 7.0 7.4 7.7  HGB 8.7* 8.4* 8.5* 8.5* 8.3*  HCT 27.1* 27.2* 27.8* 27.6* 26.7*  MCV 98.9 101.1* 100.0 101.1* 99.3  PLT 343 351 334 326 274   Cardiac Enzymes: No results for input(s): CKTOTAL, CKMB, CKMBINDEX, TROPONINI in the last 168 hours. CBG: No results for input(s): GLUCAP in the last 168 hours.  Studies/Results: Ir Fluoro Guide Cv Line Right  Result Date: 12/10/2016 CLINICAL DATA:  End-stage renal disease and failed attempt at restoring flow in an occluded right upper arm dialysis graft. EXAM: TUNNELED CENTRAL VENOUS HEMODIALYSIS CATHETER PLACEMENT WITH  ULTRASOUND AND FLUOROSCOPIC GUIDANCE ANESTHESIA/SEDATION: 1.0 mg IV Versed; 25 mcg IV Fentanyl. Total Moderate Sedation Time:  20  minutes. The patient's level of consciousness and physiologic status were continuously monitored during the procedure by Radiology nursing. MEDICATIONS: 2 g IV Ancef. As antibiotic prophylaxis, Ancef was ordered pre-procedure and administered intravenously within one hour of incision. FLUOROSCOPY TIME:  36 seconds.  3.2 mGy. PROCEDURE: The procedure, risks, benefits, and alternatives were explained to the patient. Questions regarding the procedure were encouraged and answered. The patient understands and consents to the procedure. A timeout was performed prior to initiating the procedure. The right neck and chest were prepped with chlorhexidine in a sterile fashion, and a sterile drape was applied covering the operative field. Maximum barrier sterile technique with sterile gowns and gloves were used for the procedure. Local anesthesia was provided with 1% lidocaine. Ultrasound was used to confirm patency of the right internal jugular vein. After creating a small venotomy incision, a 21 gauge needle was advanced into the right internal jugular vein under direct, real-time ultrasound guidance. Ultrasound image documentation was performed. After securing guidewire access, an 8 Fr dilator was placed. A J-wire was kinked to measure appropriate catheter length. A Palindrome tunneled hemodialysis catheter measuring 19 cm from tip to cuff was chosen for placement. This was tunneled in a retrograde fashion from the chest wall to the venotomy incision. At the venotomy, serial dilatation was performed and a 16 Fr peel-away sheath was placed over a guidewire. The catheter was then placed through the sheath and the sheath removed. Final catheter positioning was confirmed and documented with a fluoroscopic spot image. The catheter was aspirated, flushed with saline, and injected with appropriate  volume heparin dwells. The venotomy incision was closed with subcutaneous 3-0 Monocryl and subcuticular 4-0 Vicryl. Dermabond was applied to the incision. The catheter exit site was secured with 0-Prolene retention sutures. COMPLICATIONS: None.  No pneumothorax. FINDINGS: After catheter placement, the tip lies in the right atrium. The catheter aspirates normally and is ready for immediate use. IMPRESSION: Placement of tunneled hemodialysis catheter via the right internal jugular vein. The catheter tip lies in the right atrium. The catheter is ready for immediate use. Electronically Signed   By: Aletta Edouard M.D.   On: 12/10/2016 11:14   Ir US Guide Vasc Access Right  Result Date: 12/10/2016 CLINICAL DATA:  End-stage renal disease and failed attempt at restoring flow in an occluded right upper arm dialysis graft. EXAM: TUNNELED CENTRAL VENOUS HEMODIALYSIS CATHETER PLACEMENT WITH ULTRASOUND AND FLUOROSCOPIC GUIDANCE ANESTHESIA/SEDATION: 1.0 mg IV Versed; 25 mcg IV Fentanyl. Total Moderate Sedation Time:  20  minutes. The patient's level of consciousness and physiologic status were continuously monitored during the procedure by Radiology nursing. MEDICATIONS: 2 g IV Ancef. As antibiotic prophylaxis, Ancef was ordered pre-procedure and administered intravenously within one hour of incision. FLUOROSCOPY TIME:  36 seconds.  3.2 mGy. PROCEDURE: The  procedure, risks, benefits, and alternatives were explained to the patient. Questions regarding the procedure were encouraged and answered. The patient understands and consents to the procedure. A timeout was performed prior to initiating the procedure. The right neck and chest were prepped with chlorhexidine in a sterile fashion, and a sterile drape was applied covering the operative field. Maximum barrier sterile technique with sterile gowns and gloves were used for the procedure. Local anesthesia was provided with 1% lidocaine. Ultrasound was used to confirm patency of  the right internal jugular vein. After creating a small venotomy incision, a 21 gauge needle was advanced into the right internal jugular vein under direct, real-time ultrasound guidance. Ultrasound image documentation was performed. After securing guidewire access, an 8 Fr dilator was placed. A J-wire was kinked to measure appropriate catheter length. A Palindrome tunneled hemodialysis catheter measuring 19 cm from tip to cuff was chosen for placement. This was tunneled in a retrograde fashion from the chest wall to the venotomy incision. At the venotomy, serial dilatation was performed and a 16 Fr peel-away sheath was placed over a guidewire. The catheter was then placed through the sheath and the sheath removed. Final catheter positioning was confirmed and documented with a fluoroscopic spot image. The catheter was aspirated, flushed with saline, and injected with appropriate volume heparin dwells. The venotomy incision was closed with subcutaneous 3-0 Monocryl and subcuticular 4-0 Vicryl. Dermabond was applied to the incision. The catheter exit site was secured with 0-Prolene retention sutures. COMPLICATIONS: None.  No pneumothorax. FINDINGS: After catheter placement, the tip lies in the right atrium. The catheter aspirates normally and is ready for immediate use. IMPRESSION: Placement of tunneled hemodialysis catheter via the right internal jugular vein. The catheter tip lies in the right atrium. The catheter is ready for immediate use. Electronically Signed   By: Aletta Edouard M.D.   On: 12/10/2016 11:14   Ir US Guide Vasc Access Right  Result Date: 12/10/2016 INDICATION: 51 year old female with a history of a right upper extremity dialysis graft, placed earlier this year in [place] before she relocated to [place]. This has recently been initiated with dialysis and was working fine. She is now admitted to [facility] with MRSA bacteremia, with removal of the prior catheter. She has positive  blood cultures. Referred for possible restoration of flow within the dialysis graft. EXAM: RIGHT UPPER EXTREMITY DIALYSIS FISTULAGRAM ATTEMPT AT THROMBECTOMY OF FISTULAGRAM WITH PLACEMENT OF COVERED VENOUS STENT AT OUTFLOW STENOSIS. MEDICATIONS: None. ANESTHESIA/SEDATION: Moderate Sedation Time:  105 MINUTES The patient was continuously monitored during the procedure by the interventional radiology nurse under my direct supervision. FLUOROSCOPY TIME:  Fluoroscopy Time: 18 minutes 24 seconds (79 mGy). COMPLICATIONS: None PROCEDURE: Informed written consent was obtained from the patient after a thorough discussion of the procedural risks, benefits and alternatives. Specifically, the risk of septic emboli and possible inability to open the graft given the infection were discussed with the patient, who voiced her understanding. All questions were addressed. Maximal Sterile Barrier Technique was utilized including caps, mask, sterile gowns, sterile gloves, sterile drape, hand hygiene and skin antiseptic. A timeout was performed prior to the initiation of the procedure. Patient positioned supine position on the fluoroscopy table. The right upper extremity was prepped and draped in the usual sterile fashion. Ultrasound images were acquired of the graft. Ultrasound guided access was then used to puncture the graft in the presumed venous outflow direction after 1% lidocaine was used for local anesthesia. Using Seldinger technique, 6 French sheath was  placed. Angled catheter was navigated into the venous outflow, with central venogram performed. Upon withdrawal of the catheter, stenosis was identified in the axillary vein. Approximately 3 mg of tPA solution was then infused through the thrombosed venous outflow. 6 mm balloon mass serration was then performed along the venous outflow, including the stenosis at the venous outflow. 1% lidocaine was then used to anesthetize the skin over the more proximal graft, and  ultrasound-guided access was performed towards the arterial anastomosis. Seldinger technique was used to place a 6 [place] sheath. Catheter was navigated into the axillary artery and an angiogram was performed confirming occlusion at the graft at the anastomosis. The remainder of the tPA solution with a total 1 mg in solution was infused into the proximal aspect of the graft. Balloon mass serration was then performed along the proximal aspect. Over the wire Fogarty catheter was passed the arterial anastomosis, balloon was inflated, and withdrawn across the arterial anastomosis on 2 separate occasion. Balloon angioplasty was then performed of the arterial anastomosis to 6 mm. Flow was partially restored though with residual thrombus within the graft. Persisting stenosis at the venous outflow was then treated to 9 mm diameter after the sheath was replaced with a 7 [place] sheath. Balloon mass serration with the 6 mm balloon was then performed along the length of the graft once more. Flow was temporarily restored at this point after the venous outflow was treated to 9 mm. Flow was then again occluded with recurrent thrombus along the length of the graft. Balloon angioplasty along the length of the graft identified no recurrent stenoses. Adherent thrombus at the venous outflow was identified, and then treated with a covered 7 mm x 40 mm flared stent. Catheter was advanced to the arterial aspect, repeat angiogram was performed confirming diffuse thrombus through the length of the graft with no recurrent stenosis. Final images demonstrate that flow was not restored. Pursestring sutures were placed at the access sites and the procedure was terminated. Patient tolerated the procedure well and remained hemodynamically stable throughout. No complications were encountered and no significant blood loss. IMPRESSION: Status post right upper extremity fistulogram and failed attempt at restoration of flow through the dialysis graft,  likely secondary to infected graft. Hemodialysis catheter will be placed. Signed, Dulcy Fanny. Earleen Newport, DO [facility] Radiology ACCESS: The right upper extremity access flow was not restored, likely secondary to infection, and is likely not amenable to further intervention. Electronically Signed   By: Corrie Mckusick D.O.   On: 12/09/2016 19:04   Ir Thrombectomy Av Fistula W/thrombolysis/pta/stent Inc Shunt/img Rt  Result Date: 12/09/2016 INDICATION: 51 year old female with a history of a right upper extremity dialysis graft, placed earlier this year in [place] before she relocated to [place]. This has recently been initiated with dialysis and was working fine. She is now admitted to [facility] with MRSA bacteremia, with removal of the prior catheter. She has positive blood cultures. Referred for possible restoration of flow within the dialysis graft. EXAM: RIGHT UPPER EXTREMITY DIALYSIS FISTULAGRAM ATTEMPT AT THROMBECTOMY OF FISTULAGRAM WITH PLACEMENT OF COVERED VENOUS STENT AT OUTFLOW STENOSIS. MEDICATIONS: None. ANESTHESIA/SEDATION: Moderate Sedation Time:  105 MINUTES The patient was continuously monitored during the procedure by the interventional radiology nurse under my direct supervision. FLUOROSCOPY TIME:  Fluoroscopy Time: 18 minutes 24 seconds (79 mGy). COMPLICATIONS: None PROCEDURE: Informed written consent was obtained from the patient after a thorough discussion of the procedural risks, benefits and alternatives. Specifically, the risk of  septic emboli and possible inability to open the graft given the infection were discussed with the patient, who voiced her understanding. All questions were addressed. Maximal Sterile Barrier Technique was utilized including caps, mask, sterile gowns, sterile gloves, sterile drape, hand hygiene and skin antiseptic. A timeout was performed prior to the initiation of the procedure. Patient positioned supine  position on the fluoroscopy table. The right upper extremity was prepped and draped in the usual sterile fashion. Ultrasound images were acquired of the graft. Ultrasound guided access was then used to puncture the graft in the presumed venous outflow direction after 1% lidocaine was used for local anesthesia. Using Seldinger technique, 6 French sheath was placed. Angled catheter was navigated into the venous outflow, with central venogram performed. Upon withdrawal of the catheter, stenosis was identified in the axillary vein. Approximately 3 mg of tPA solution was then infused through the thrombosed venous outflow. 6 mm balloon mass serration was then performed along the venous outflow, including the stenosis at the venous outflow. 1% lidocaine was then used to anesthetize the skin over the more proximal graft, and ultrasound-guided access was performed towards the arterial anastomosis. Seldinger technique was used to place a 6 [place] sheath. Catheter was navigated into the axillary artery and an angiogram was performed confirming occlusion at the graft at the anastomosis. The remainder of the tPA solution with a total 1 mg in solution was infused into the proximal aspect of the graft. Balloon mass serration was then performed along the proximal aspect. Over the wire Fogarty catheter was passed the arterial anastomosis, balloon was inflated, and withdrawn across the arterial anastomosis on 2 separate occasion. Balloon angioplasty was then performed of the arterial anastomosis to 6 mm. Flow was partially restored though with residual thrombus within the graft. Persisting stenosis at the venous outflow was then treated to 9 mm diameter after the sheath was replaced with a 7 [place] sheath. Balloon mass serration with the 6 mm balloon was then performed along the length of the graft once more. Flow was temporarily restored at this point after the venous outflow was treated to 9 mm. Flow was then again occluded with  recurrent thrombus along the length of the graft. Balloon angioplasty along the length of the graft identified no recurrent stenoses. Adherent thrombus at the venous outflow was identified, and then treated with a covered 7 mm x 40 mm flared stent. Catheter was advanced to the arterial aspect, repeat angiogram was performed confirming diffuse thrombus through the length of the graft with no recurrent stenosis. Final images demonstrate that flow was not restored. Pursestring sutures were placed at the access sites and the procedure was terminated. Patient tolerated the procedure well and remained hemodynamically stable throughout. No complications were encountered and no significant blood loss. 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 Hemodialysis catheter will be placed. Signed, Dulcy Fanny. Earleen Newport, DO [facility] Radiology ACCESS: The right upper extremity access flow was not restored, likely secondary to infection, and is likely not amenable to further intervention. Electronically Signed   By: Corrie Mckusick D.O.   On: 12/09/2016 19:04   Medications: . vancomycin     . calcitRIOL  1 mcg Oral Q M,W,F-HD  . calcium acetate  1,334 mg Oral TID WC  . cinacalcet  90 mg Oral Q breakfast  . [START ON 12/14/2016] darbepoetin (ARANESP) injection - DIALYSIS  200 mcg  Intravenous Q Tue-HD  . feeding supplement (PRO-STAT SUGAR FREE 64)  30 mL Oral TID  . multivitamin  1 tablet Oral QHS  . nystatin   Topical TID  . sertraline  50 mg Oral Daily

## 2016-12-12 LAB — BASIC METABOLIC PANEL
Anion gap: 12 (ref 5–15)
BUN: 38 mg/dL — AB (ref 6–20)
CO2: 26 mmol/L (ref 22–32)
CREATININE: 7.63 mg/dL — AB (ref 0.44–1.00)
Calcium: 8.8 mg/dL — ABNORMAL LOW (ref 8.9–10.3)
Chloride: 98 mmol/L — ABNORMAL LOW (ref 101–111)
GFR calc Af Amer: 6 mL/min — ABNORMAL LOW (ref >60)
GFR calc non Af Amer: 5 mL/min — ABNORMAL LOW (ref >60)
Glucose, Bld: 115 mg/dL — ABNORMAL HIGH (ref 65–99)
Potassium: 4.3 mmol/L (ref 3.5–5.1)
Sodium: 136 mmol/L (ref 135–145)

## 2016-12-12 LAB — CBC
HCT: 26.6 % — ABNORMAL LOW (ref 36.0–46.0)
HEMOGLOBIN: 8.4 g/dL — AB (ref 12.0–15.0)
MCH: 31.6 pg (ref 26.0–34.0)
MCHC: 31.6 g/dL (ref 30.0–36.0)
MCV: 100 fL (ref 78.0–100.0)
PLATELETS: 279 10*3/uL (ref 150–400)
RBC: 2.66 MIL/uL — AB (ref 3.87–5.11)
RDW: 14.6 % (ref 11.5–15.5)
WBC: 7 10*3/uL (ref 4.0–10.5)

## 2016-12-12 LAB — HEPARIN LEVEL (UNFRACTIONATED): Heparin Unfractionated: 0.34 IU/mL (ref 0.30–0.70)

## 2016-12-12 MED ORDER — HEPARIN (PORCINE) IN NACL 100-0.45 UNIT/ML-% IJ SOLN
1800.0000 [IU]/h | INTRAMUSCULAR | Status: DC
  Administered 2016-12-12 – 2016-12-13 (×2): 1800 [IU]/h via INTRAVENOUS
  Filled 2016-12-12 (×2): qty 250

## 2016-12-12 NOTE — Progress Notes (Addendum)
Subjective:   No c/o today, getting OOB and walking some, eating good, no c/o.   Objective Vital signs in last 24 hours: Vitals:   12/11/16 1638 12/11/16 2043 12/12/16 0539 12/12/16 0859  BP: (!) 119/47 108/62 (!) 94/59 100/70  Pulse: 83 (!) 56 (!) 50 (!) 51  Resp: 16 18 17 18   Temp: 98.5 F (36.9 C) 98.4 F (36.9 C) 97.9 F (36.6 C) 98 F (36.7 C)  TempSrc: Oral   Oral  SpO2: 92% 96% 96% 100%  Weight:      Height:       Weight change:   Physical Exam: General:Obese female NAD Heart:RRR ,no m,r,g Lungs:CTA Abdomen:Obese , Bs +, soft , NT, ND ,lower adb dressing dry clean Dialysis Access: persistently clotted RUE AVG, new R IJ TDC placed 12/6 Extremities: no LE edema   Dialysis: [facility] 4h   117.5kg   2/2 bath  RUA AVG (now clotted)  Hep 5000 [facility] 4 hr EDW 117.5 2 K 2 Ca AVGG heparin 5000  -venofer 50  -mircera 100 q 2 weeks - last dose 11/27 -calcitriol 1 -no Fe -recent labs: hgb 7.7 dropping 11% sat, no ferritin, no K ipTH 461  Home meds: -eliquis 5 bid/ phoslo 2-3 ac/ sensipar 90 qd/ oxy IR prn/ phenergan prn/ zoloft 50    Assessment: 1. Clotted RUA AVG: unfortunately could not be declotted, new R IJ TDC placed 12/6.  Will need eval for access once antibiotics completed. 2. MRSA bacteremia thought secondary to catheter sepsis (+ BC and perm cath exit drainage cultures 11/27), R IJ cath out 11/30/1/8.  On IV Vanco.ID seeing, rec 2 weeks of IV vanc (until 12/13)) TTE without vegetations.  F/U blood cultures 11/30 negative. Chest [place] no abscess 3. ESRD- usual TTS HD. Was off sched last week, plan next HD Tuesday. 4. HTN/ vol - no BP meds at home or here. BP's soft.  Just under dry wt, no vol excess on exam.  5. Anemiaof CKD- Hgb 8-9 range, s/p  1U PRBC 12/2 /Fe def anemia - tsat 11%OP hold fe load sec Bacteremia  -Mircera last dose100 11/30/16  - next due 12/14/16 increase 200  6. Metabolic bone disease- Continue calcitriol, sensipar 90, calcium  acetate 7. Nutrition- alb 3.1holding Nepro d/t diarrhea, appetite improving  8. Hx DVT - was on Eliquison admit /had been on coumadin in remotely in the past/ eliquis on hold for bleeding from urostomy/ ileal conduit 9. 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 Hx ovarianvs cervicalcancers/p prior chemo/radiation( In [place]. )but not surgery- "cancer free since 2000" no records available.  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 Has chronic R perc neph tube draining R kidney.   Plan - HD Tuesday      Rob Athens MD [facility] pager [phone]   12/12/2016, 1:09 PM    Labs: Basic Metabolic Panel: Recent Labs  Lab 12/10/16 0404 12/11/16 0459 12/12/16 0451  NA 137 136 136  K 4.2 4.5 4.3  CL 98* 99* 98*  CO2 26 25 26   GLUCOSE 94 93 115*  BUN 25* 22* 38*  CREATININE 8.12* 5.84* 7.63*  CALCIUM 9.1 9.0 8.8*   Liver Function Tests: No results for input(s): AST, ALT, ALKPHOS, BILITOT, PROT, ALBUMIN in the last 168 hours. No results for input(s): LIPASE, AMYLASE in the last 168 hours.  No results for input(s): AMMONIA in the last 168 hours. CBC: Recent Labs  Lab 12/08/16 0734 12/09/16 0625 12/10/16 0404 12/11/16 0459 12/12/16 0451  WBC 7.0 7.0 7.4 7.7 7.0  HGB 8.4* 8.5* 8.5* 8.3* 8.4*  HCT 27.2* 27.8* 27.6* 26.7* 26.6*  MCV 101.1* 100.0 101.1* 99.3 100.0  PLT 351 334 326 274 279   Cardiac Enzymes: No results for input(s): CKTOTAL, CKMB, CKMBINDEX, TROPONINI in the last 168 hours. CBG: No results for input(s): GLUCAP in the last 168 hours.  Studies/Results: No results found. Medications: . heparin 1,800 Units/hr (12/12/16 1141)  . vancomycin     . calcitRIOL  1 mcg Oral Q M,W,F-HD  . calcium acetate  1,334 mg Oral TID WC  . cinacalcet  90 mg Oral Q breakfast  . [START ON 12/14/2016]  darbepoetin (ARANESP) injection - DIALYSIS  200 mcg Intravenous Q Tue-HD  . feeding supplement (PRO-STAT SUGAR FREE 64)  30 mL Oral TID  . multivitamin  1 tablet Oral QHS  . nystatin   Topical TID  . sertraline  50 mg Oral Daily

## 2016-12-12 NOTE — Progress Notes (Signed)
[place] for heparin while apixaban on hold  Indication: hx of DVT  Allergies  Allergen Reactions  . Erythromycin Anaphylaxis  . Ciprofloxacin     IV only-burning to her veins, can take PO  . Chlorhexidine Rash  . 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 Last dose of apixaban 12/2AM.  Heparin was held 12/5 in preparation for ileal conduit endoscopy on 12/6- restarted today at 1141. First heparin level since restart came back at 0.34, on 1800 units/hr. No issues with infusion per nursing.  Hgb 8.4, plts 279- no bleeding noted.  Goal of Therapy:  Heparin level 0.3-0.7 units/ml Monitor platelets by anticoagulation protocol: Yes   Plan:  Cotninue heparin at 1800 units/hr Confirmatory heparin level in 8 hours Daily heparin level and CBC Follow switch back to oral agent when feasible   Doylene Canard, PharmD Clinical Pharmacist  Pager: [phone] Phone: [phone] 12/12/2016 9:46 PM

## 2016-12-12 NOTE — Progress Notes (Signed)
PROGRESS NOTE    Judy Kelley  XLK:[id number] DOB: March 09, 1965 DOA: 12/03/2016 PCP: System, Pcp Not In     Brief Narrative:  [name] a 51 y.o.femalewithhistory of ESRD on hemodialysis, history of DVT, previous history of ovarian cancer in remission who had just recently moved from [place] was found to have redness around her right subclavian dialysis catheter 3 days prior to admission and had associated fever and chills. Blood cultures were obtained at dialysis center and patient was empirically started on vancomycin. Patient had received her second dose of vancomycin at dialysis center. Cultures came back as staph aureus and was instructed to come to the ER. She was found to have MRSA bacteremia and right subclavian dialysis catheter was removed. ID has been following. She also had bleeding from ileal conduit stoma for which urology has been consulted. She underwent endoscopy of ileal conduit on 12/6 which did not reveal source of bleed, but revealed strictures of ileal conduit.    Assessment & Plan:   Principal Problem:   Bacteremia Active Problems:   ESRD on dialysis ([facility])   Hypokalemia   History of ovarian cancer   MRSA bacteremia  MRSA bacteremia  -Likely secondary to dialysis catheter (right subclavian) that was removed 11/30  -Initial blood cultures obtained 11/27 as an outpatient, per nephrology showed to be MRSA -Blood cultures 11/30 negative at 5 days  -Echocardiogram: EF 60-65%, grade 2DD, no mention of valvular involvement  -[place] chest: No evidence of abscess within the right anterior chest wall -Infectious disease consulted and appreciated- recommended 2 weeks of IV vanc with HD. 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 Spoke with Dr. Jeffie Pollock post-operatively. Plan to hold anticoagulation for few days and watch for bleed. Bleeding increased last 24 hours, but not as bad as it was earlier last week. Will start heparin gtt and watch for bleeding. 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 Now with RIJ tunneled HD catheter   Diarrhea -C. difficile negative -Resolved   History of DVT -Was on Eliquis at home -Start heparin gtt today   History of ovarian cancer -Currently in remission  Normocytic normochromic anemia/anemia of chronic renal disease -Hemoglobin dropped to 6.8, received 1uPRBC -Hemoglobin stable   Dermatitis/rash -Appears to be more fungal vs pityriasis rosea (this would be self limited with only symptomatic treatment), only on flanks and B/L -Continue nystatin powder, sarna and continue to monitor   Morbid obesity  -BMI 40 -Discuss weight management with PCP on discharge   DVT prophylaxis: Hold Eliquis, start heparin gtt today  Code Status: Full Family Communication: No family at bedside Disposition Plan: pending improvement, watch for bleeding on anticoagulant    Consultants:   Nephrology  ID  Urology  IR   Antimicrobials:  Anti-infectives (From admission, onward)   Start     Dose/Rate Route Frequency Ordered Stop   12/11/16 1200  vancomycin (VANCOCIN) IVPB 1000 mg/200 mL premix     1,000 mg  200 mL/hr over 60 Minutes Intravenous Every T-Th-Sa (Hemodialysis) 12/09/16 1151     12/10/16 1200  vancomycin (VANCOCIN) IVPB 1000 mg/200 mL premix     1,000 mg 200 mL/hr over 60 Minutes Intravenous Every M-W-F (Hemodialysis)  12/09/16 1151 12/10/16 1302   12/09/16 1630  ceFAZolin (ANCEF) IVPB 2g/100 mL premix     2 g 200 mL/hr over 30 Minutes Intravenous To [facility] 12/09/16 1617 12/10/16 1630   12/09/16 1613  ceFAZolin (ANCEF) 2-4 GM/100ML-% IVPB    Comments:  Desiree Hane   : cabinet override      12/09/16 1613 12/09/16 1623   12/09/16 1200  vancomycin (VANCOCIN) IVPB 1000 mg/200 mL premix  Status:  Discontinued     1,000 mg 200 mL/hr over 60 Minutes Intravenous Every T-Th-Sa (Hemodialysis) 12/08/16 1136 12/09/16 1151   12/08/16 1230  vancomycin (VANCOCIN) IVPB 1000 mg/200 mL premix     1,000 mg 200 mL/hr over 60 Minutes Intravenous  Once 12/08/16 1136 12/08/16 1353   12/06/16 1736  vancomycin (VANCOCIN) 1-5 GM/200ML-% IVPB    Comments:  Cherylann Banas   : cabinet override      12/06/16 1736 12/07/16 0544   12/06/16 1200  vancomycin (VANCOCIN) IVPB 1000 mg/200 mL premix  Status:  Discontinued     1,000 mg 200 mL/hr over 60 Minutes Intravenous Every M-W-F (Hemodialysis) 12/04/16 1708 12/08/16 1136       Subjective: No new complaints. Had episodes of nausea and vomiting last night which has now resolved. Having normal BM    Objective: Vitals:   12/11/16 1638 12/11/16 2043 12/12/16 0539 12/12/16 0859  BP: (!) 119/47 108/62 (!) 94/59 100/70  Pulse: 83 (!) 56 (!) 50 (!) 51  Resp: 16 18 17 18   Temp: 98.5 F (36.9 C) 98.4 F (36.9 C) 97.9 F (36.6 C) 98 F (36.7 C)  TempSrc: Oral   Oral  SpO2: 92% 96% 96% 100%  Weight:      Height:        Intake/Output Summary (Last 24 hours) at 12/12/2016 1205 Last data filed at 12/12/2016 0903 Gross per 24 hour  Intake 238 ml  Output 1015 ml  Net -777 ml   Filed Weights   12/10/16 0755 12/10/16 1224 12/10/16 2224  Weight: 115.8 kg (255 lb 4.7 oz) 115 kg (253 lb 8.5 oz) 116 kg (255 lb 11.7 oz)    Examination:  General exam: Appears calm and comfortable Respiratory system: Clear to auscultation. Respiratory effort normal. Cardiovascular  system: S1 & S2 heard, RRR. No JVD, murmurs, rubs, gallops or clicks. No pedal edema. Gastrointestinal system: Abdomen is nondistended, soft and nontender. No organomegaly or masses felt. Normal bowel sounds heard. Central nervous system: Alert and oriented. No focal neurological deficits. Extremities: Symmetric  Skin: No rashes, lesions or ulcers. Stoma with more bleed compared to last week   Psychiatry: Judgement and insight appear normal. Mood & affect appropriate.   Data Reviewed: I have personally reviewed following labs and imaging studies  CBC: Recent Labs  Lab 12/08/16 0734 12/09/16 0625 12/10/16 0404 12/11/16 0459 12/12/16 0451  WBC 7.0 7.0 7.4 7.7 7.0  HGB 8.4* 8.5* 8.5* 8.3* 8.4*  HCT 27.2* 27.8* 27.6* 26.7* 26.6*  MCV 101.1* 100.0 101.1* 99.3 100.0  PLT 351 334 326 274 638   Basic Metabolic Panel: Recent Labs  Lab 12/08/16 0734 12/09/16 0625 12/10/16 0404 12/11/16 0459 12/12/16 0451  NA 137 137 137 136 136  K 3.9 3.7 4.2 4.5 4.3  CL 95*  98* 98* 99* 98*  CO2 28 28 26 25 26   GLUCOSE 84 99 94 93 115*  BUN 9 18 25* 22* 38*  CREATININE 4.47* 6.74* 8.12* 5.84* 7.63*  CALCIUM 8.0* 8.6* 9.1 9.0 8.8*   GFR: Estimated Creatinine Clearance: 11.4 mL/min (A) (by C-G formula based on SCr of 7.63 mg/dL (H)). Liver Function Tests: No results for input(s): AST, ALT, ALKPHOS, BILITOT, PROT, ALBUMIN in the last 168 hours. No results for input(s): LIPASE, AMYLASE in the last 168 hours. No results for input(s): AMMONIA in the last 168 hours. Coagulation Profile: No results for input(s): INR, PROTIME in the last 168 hours. Cardiac Enzymes: No results for input(s): CKTOTAL, CKMB, CKMBINDEX, TROPONINI in the last 168 hours. BNP (last 3 results) No results for input(s): PROBNP in the last 8760 hours. HbA1C: No results for input(s): HGBA1C in the last 72 hours. CBG: No results for input(s): GLUCAP in the last 168 hours. Lipid Profile: No results for input(s): CHOL, HDL,  LDLCALC, TRIG, CHOLHDL, LDLDIRECT in the last 72 hours. Thyroid Function Tests: No results for input(s): TSH, T4TOTAL, FREET4, T3FREE, THYROIDAB in the last 72 hours. Anemia Panel: No results for input(s): VITAMINB12, FOLATE, FERRITIN, TIBC, IRON, RETICCTPCT in the last 72 hours. Sepsis Labs: No results for input(s): PROCALCITON, LATICACIDVEN in the last 168 hours.  Recent Results (from the past 240 hour(s))  Culture, blood (Routine x 2)     Status: None   Collection Time: 12/03/16  6:20 PM  Result Value Ref Range Status   Specimen Description BLOOD LEFT HAND  Final   Special Requests   Final    Blood Culture adequate volume BOTTLES DRAWN AEROBIC AND ANAEROBIC   Culture NO GROWTH 5 DAYS  Final   Report Status 12/08/2016 FINAL  Final  Culture, blood (Routine x 2)     Status: None   Collection Time: 12/03/16  9:28 PM  Result Value Ref Range Status   Specimen Description BLOOD LEFT ANTECUBITAL  Final   Special Requests   Final    Blood Culture adequate volume BOTTLES DRAWN AEROBIC ONLY   Culture NO GROWTH 5 DAYS  Final   Report Status 12/08/2016 FINAL  Final  MRSA PCR Screening     Status: Abnormal   Collection Time: 12/04/16  1:51 AM  Result Value Ref Range Status   MRSA by PCR POSITIVE (A) NEGATIVE Final    Comment:        The GeneXpert MRSA Assay (FDA approved for NASAL specimens only), is one component of a comprehensive MRSA colonization surveillance program. It is not intended to diagnose MRSA infection nor to guide or monitor treatment for MRSA infections. RESULT CALLED TO, READ BACK BY AND VERIFIED WITH: Fulton Reek RN [phone] 12/04/16 A BROWNING   C difficile quick scan w PCR reflex     Status: None   Collection Time: 12/04/16 10:45 AM  Result Value Ref Range Status   C Diff antigen NEGATIVE NEGATIVE Final   C Diff toxin NEGATIVE NEGATIVE Final   C Diff interpretation No C. difficile detected.  Final  Culture, Urine     Status: None   Collection Time: 12/04/16 10:46 AM    Result Value Ref Range Status   Specimen Description URINE, RANDOM  Final   Special Requests NONE  Final   Culture NO GROWTH  Final   Report Status 12/05/2016 FINAL  Final       Radiology Studies: No results found.    Scheduled Meds: . calcitRIOL  1 mcg Oral Q M,W,F-HD  . calcium acetate  1,334 mg Oral TID WC  . cinacalcet  90 mg Oral Q breakfast  . [START ON 12/14/2016] darbepoetin (ARANESP) injection - DIALYSIS  200 mcg Intravenous Q Tue-HD  . feeding supplement (PRO-STAT SUGAR FREE 64)  30 mL Oral TID  . multivitamin  1 tablet Oral QHS  . nystatin   Topical TID  . sertraline  50 mg Oral Daily   Continuous Infusions: . heparin 1,800 Units/hr (12/12/16 1141)  . vancomycin       LOS: 9 days    Time spent: 30 minutes   Dessa Phi, DO [facility] [email or web address] Password [email or web address] 12/12/2016, 12:05 PM

## 2016-12-12 NOTE — Progress Notes (Signed)
[place] for heparin while apixaban on hold  Indication: hx of DVT  Allergies  Allergen Reactions  . Erythromycin Anaphylaxis  . Ciprofloxacin     IV only-burning to her veins, can take PO  . Chlorhexidine Rash  . 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 Last dose of apixaban 12/2AM.  Heparin was held 12/5 in preparation for ileal conduit endoscopy on 12/6. Heparin is now to be restarted to determine patient's bleeding risk before restarting Eliquis  Hgb 8.4, plts 279- no bleeding noted.  Goal of Therapy:  Heparin level 0.3-0.7 units/ml aPTT 66-102 seconds Monitor platelets by anticoagulation protocol: Yes   Plan:  Restart heparin at 1800 units/hr Heparin level at 1930 this evening Daily heparin level and CBC Follow switch back to oral agent when feasible   Jenean Escandon D. Halona Amstutz, PharmD, BCPS Clinical Pharmacist Clinical Phone for 12/12/2016 until 3:30pm: [phone] If after 3:30pm, please call main pharmacy at [phone] 12/12/2016 10:46 AM

## 2016-12-13 LAB — BASIC METABOLIC PANEL
ANION GAP: 12 (ref 5–15)
BUN: 53 mg/dL — ABNORMAL HIGH (ref 6–20)
CALCIUM: 8.7 mg/dL — AB (ref 8.9–10.3)
CHLORIDE: 99 mmol/L — AB (ref 101–111)
CO2: 25 mmol/L (ref 22–32)
Creatinine, Ser: 8.81 mg/dL — ABNORMAL HIGH (ref 0.44–1.00)
GFR calc Af Amer: 5 mL/min — ABNORMAL LOW (ref >60)
GFR calc non Af Amer: 5 mL/min — ABNORMAL LOW (ref >60)
GLUCOSE: 87 mg/dL (ref 65–99)
Potassium: 4.6 mmol/L (ref 3.5–5.1)
Sodium: 136 mmol/L (ref 135–145)

## 2016-12-13 LAB — CBC
HEMATOCRIT: 26.3 % — AB (ref 36.0–46.0)
HEMOGLOBIN: 8.2 g/dL — AB (ref 12.0–15.0)
MCH: 30.7 pg (ref 26.0–34.0)
MCHC: 31.2 g/dL (ref 30.0–36.0)
MCV: 98.5 fL (ref 78.0–100.0)
Platelets: 320 10*3/uL (ref 150–400)
RBC: 2.67 MIL/uL — ABNORMAL LOW (ref 3.87–5.11)
RDW: 14 % (ref 11.5–15.5)
WBC: 7 10*3/uL (ref 4.0–10.5)

## 2016-12-13 LAB — HEPARIN LEVEL (UNFRACTIONATED): Heparin Unfractionated: 0.65 IU/mL (ref 0.30–0.70)

## 2016-12-13 MED ORDER — APIXABAN 5 MG PO TABS
5.0000 mg | ORAL_TABLET | Freq: Two times a day (BID) | ORAL | Status: DC
  Administered 2016-12-13 – 2016-12-14 (×3): 5 mg via ORAL
  Filled 2016-12-13 (×3): qty 1

## 2016-12-13 MED ORDER — SODIUM CHLORIDE 0.9 % IV SOLN
125.0000 mg | INTRAVENOUS | Status: DC
  Administered 2016-12-14: 125 mg via INTRAVENOUS
  Filled 2016-12-13 (×2): qty 10

## 2016-12-13 MED ORDER — OXYCODONE HCL 5 MG PO CAPS: 5.0000 mg | ORAL_CAPSULE | Freq: Four times a day (QID) | ORAL | 0 refills | Status: DC | PRN

## 2016-12-13 NOTE — Discharge Summary (Addendum)
Physician Discharge Summary  Judy Kelley FUX:[id number] DOB: 09/05/1965 DOA: 12/03/2016  PCP: System, Pcp Not In  Admit date: 12/03/2016 Discharge date: 12/14/2016  Admitted From: Home Disposition:  Home  Recommendations for Outpatient Follow-up:  1. Follow up with PCP in 1 week 2. Follow up with Dr. Jeffie Pollock for follow up  3. Recommend outpatient vascular surgery eval for access once bacteremia treatment completed 4. Last date of vancomycin treatment 12/13, will receive with HD   Discharge Condition: Stable CODE STATUS: Full  Diet recommendation: Renal   Brief/Interim Summary: Judy Kelley a 51 y.o.femalewithhistory of ESRD on hemodialysis, history of DVT, previous history of ovarian cancer in remission who had just recently moved from [place] was found to have redness around her right subclavian dialysis catheter 3 days prior to admission and had associated fever and chills. Blood cultures were obtained at dialysis center and patient was empirically started on vancomycin. Patient had received her second dose of vancomycin at dialysis center. Cultures came back as staph aureus and was instructed to come to the ER. She was found to have MRSA bacteremia and right subclavian dialysis catheter was removed. ID has been following. She also had bleeding from ileal conduit stoma for which urology has been consulted. She underwent endoscopy of ileal conduit on 12/6 which did not reveal source of bleed, but revealed strictures of ileal conduit. Anticoagulation was held to watch for re-bleed. Due to clotted AV graft on right upper extremity, IR was consulted for stent of stenosis, but had recurrent diffuse thrombosis. RIJ tunneled catheter was placed.   Discharge Diagnoses:  Principal Problem:   MRSA bacteremia Active Problems:   ESRD on dialysis ([place])   Hypokalemia   History of ovarian cancer  MRSA bacteremia  -Likely secondary to dialysis catheter (right subclavian) that was removed  11/30  -Initial blood cultures obtained 11/27 as an outpatient, per nephrology showed to be MRSA -Blood cultures 11/30 negative at 5 days  -Echocardiogram:EF 60-65%, grade 2DD, no mention of valvular involvement  -[place] chest: No evidence of abscess within the right anterior chest wall -Infectious disease consulted and appreciated- recommended 2 weeks of IV vanc with HD. 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 Spoke with Dr. Jeffie Pollock post-operatively. Plan to hold anticoagulation for few days and watch for bleed. Bleeding increased off anticoagulant, but not as bad as it was earlier last week. Heparin gtt was started and bleeding was stable and minimal. She will resume home eliquis on discharge  ESRD -Nephrology consulted and appreciated -HD TTS  Clotted AV graft RUE -IR consulted for stent of stenosis with recurrent diffuse thrombosis. Now with RIJ tunneled HD catheter placed 12/6. Will need to evaluate access once antibiotics completed   Diarrhea -C. difficile negative -Resolved   History of DVT -Was on Eliquis at home. Stable bleeding on heparin gtt. Resume eliquis on discharge   History of ovarian cancer -Currently in remission  Normocytic normochromic anemia/anemia of chronic renal disease -Hemoglobin dropped to 6.8, received 1uPRBC -Hemoglobin stable   Dermatitis/rash -Appears to be more fungalvspityriasis rosea(this would be self limited with only symptomatic treatment), only on flanks and B/L -Continuenystatin powder, sarnaand continue  to monitor   Morbid  obesity  -BMI 40 -Discuss weight management with PCP on discharge  Sinus bradycardia -Asymptomatic and stable     Discharge Instructions  Discharge Instructions    Call MD for:   Complete by:  As directed    Increased drainage/bleeding from stoma   Call MD for:  difficulty breathing, headache or visual disturbances   Complete by:  As directed    Call MD for:  extreme fatigue   Complete by:  As directed    Call MD for:  persistant dizziness or light-headedness   Complete by:  As directed    Call MD for:  persistant nausea and vomiting   Complete by:  As directed    Call MD for:  severe uncontrolled pain   Complete by:  As directed    Call MD for:  temperature >100.4   Complete by:  As directed    Change dressing (specify)   Complete by:  As directed    Dressing change: once daily   Discharge instructions   Complete by:  As directed    You were cared for by a hospitalist during your hospital stay. If you have any questions about your discharge medications or the care you received while you were in the hospital after you are discharged, you can call the unit and asked to speak with the hospitalist on call if the hospitalist that took care of you is not available. Once you are discharged, your primary care physician will handle any further medical issues. Please note that NO REFILLS for any discharge medications will be authorized once you are discharged, as it is imperative that you return to your primary care physician (or establish a relationship with a primary care physician if you do not have one) for your aftercare needs so that they can reassess your need for medications and monitor your lab values.   Increase activity slowly   Complete by:  As directed      Allergies as of 12/13/2016      Reactions   Erythromycin Anaphylaxis   Ciprofloxacin    IV only-burning to her veins, can take PO   Chlorhexidine Rash   Other Rash   chloraprep      Medication List    TAKE  these medications   acetaminophen 500 MG tablet Commonly known as:  TYLENOL Take 1,000 mg by mouth every 6 (six) hours as needed for mild pain.   calcium acetate 667 MG capsule Commonly known as:  PHOSLO Take 667-1,334 mg by mouth See admin instructions. Takes 1334 with meals and 667 with snacks   cinacalcet 90 MG tablet Commonly known as:  SENSIPAR Take 90 mg by mouth daily.   ELIQUIS 5 MG Tabs tablet Generic drug:  apixaban Take 5 mg by mouth 2 (two) times daily.   oxycodone 5 MG capsule Commonly known as:  OXY-IR Take 1 capsule (5 mg total) by mouth every 6 (six) hours as needed for pain. What changed:    when to take this  reasons to take this   promethazine 25 MG tablet Commonly known as:  PHENERGAN Take 25 mg by mouth every 6 (six) hours as needed for nausea or vomiting.   sertraline 50 MG tablet Commonly known as:  ZOLOFT Take 50 mg by mouth daily.            Discharge Care Instructions  (From admission, onward)        Start     Ordered  12/13/16 0000  Change dressing (specify)    Comments:  Dressing change: once daily   12/13/16 0932     Follow-up Information    Establish with PCP as soon as possible Follow up.        Irine Seal, MD Follow up.   Specialty:  Urology Contact information: [address] [place] [address] [phone]          Allergies  Allergen Reactions  . Erythromycin Anaphylaxis  . Ciprofloxacin     IV only-burning to her veins, can take PO  . Chlorhexidine Rash  . Other Rash    chloraprep    Consultations:  Nephrology  ID  Urology  IR   Procedures/Studies: Ct Abdomen Pelvis W Wo Contrast  Result Date: 12/06/2016 CLINICAL DATA:  Bladder disorder. EXAM: CT ABDOMEN AND PELVIS WITHOUT AND WITH CONTRAST TECHNIQUE: Multidetector CT imaging of the abdomen and pelvis was performed following the standard protocol before and following the bolus administration of intravenous contrast. CONTRAST:  159mL  ISOVUE-300 IOPAMIDOL (ISOVUE-300) INJECTION 61% COMPARISON:  None. FINDINGS: Lower chest: Lung bases show dependent atelectasis bilaterally. Heart is mildly enlarged. No pericardial or pleural effusion. Distal periesophageal lymph nodes are subcentimeter in short axis size. Distal esophagus is grossly unremarkable. Hepatobiliary: Subcentimeter low-attenuation lesion in the posterior right hepatic lobe is too small to characterize. Liver and gallbladder are otherwise unremarkable. No biliary ductal dilatation. Pancreas: Negative. Spleen: Negative. Adrenals/Urinary Tract: Adrenal glands are unremarkable. No urinary stones. Percutaneous nephrostomy tube in the right kidney which appears somewhat atrophic and scarred. No right hydronephrosis. No residual left renal parenchyma visualized. Residual dilated collecting system in the left renal fossa measures 5.1 x 6.3 cm. Minimal excretion of contrast from the right kidney. Patient reportedly has an ileal conduit. Stomach/Bowel: Stomach is unremarkable. Postoperative changes in the distal small bowel. Large midline ventral abdominal wall hernia contains unobstructed colon. Colon is otherwise unremarkable. Vascular/Lymphatic: Atherosclerotic calcification of the arterial vasculature without abdominal aortic aneurysm. No pathologically enlarged lymph nodes. Reproductive: Uterus is visualized.  No adnexal mass. Other: Presacral thickening. No pleural fluid. There are 2 midline ventral abdominal wall hernias, a smaller and more superiorly located hernia measures 4.1 x 5.8 cm and contains only fat, and a larger hernia that measures 8.4 x 17.8 cm and contains unobstructed colon. Musculoskeletal: No worrisome lytic or sclerotic lesions. Degenerative changes in the spine. IMPRESSION: 1. Right nephrostomy in place without hydronephrosis. No visible left renal parenchyma with a residual dilated collecting system in the left renal fossa. Patient reportedly has an ileal conduit. 2.  Midline ventral abdominal wall hernias, the largest of which contains unobstructed colon. 3.  Aortic atherosclerosis (ICD10-170.0). Electronically Signed   By: Lorin Picket M.D.   On: 12/06/2016 08:21   Ir Fluoro Guide Cv Line Right  Result Date: 12/10/2016 CLINICAL DATA:  End-stage renal disease and failed attempt at restoring flow in an occluded right upper arm dialysis graft. EXAM: TUNNELED CENTRAL VENOUS HEMODIALYSIS CATHETER PLACEMENT WITH ULTRASOUND AND FLUOROSCOPIC GUIDANCE ANESTHESIA/SEDATION: 1.0 mg IV Versed; 25 mcg IV Fentanyl. Total Moderate Sedation Time:  20  minutes. The patient's level of consciousness and physiologic status were continuously monitored during the procedure by Radiology nursing. MEDICATIONS: 2 g IV Ancef. As antibiotic prophylaxis, Ancef was ordered pre-procedure and administered intravenously within one hour of incision. FLUOROSCOPY TIME:  36 seconds.  3.2 mGy. PROCEDURE: The procedure, risks, benefits, and alternatives were explained to the patient. Questions regarding the procedure were encouraged and answered. The patient understands  and consents to the procedure. A timeout was performed prior to initiating the procedure. The right neck and chest were prepped with chlorhexidine in a sterile fashion, and a sterile drape was applied covering the operative field. Maximum barrier sterile technique with sterile gowns and gloves were used for the procedure. Local anesthesia was provided with 1% lidocaine. Ultrasound was used to confirm patency of the right internal jugular vein. After creating a small venotomy incision, a 21 gauge needle was advanced into the right internal jugular vein under direct, real-time ultrasound guidance. Ultrasound image documentation was performed. After securing guidewire access, an 8 Fr dilator was placed. A J-wire was kinked to measure appropriate catheter length. A Palindrome tunneled hemodialysis catheter measuring 19 cm from tip to cuff was  chosen for placement. This was tunneled in a retrograde fashion from the chest wall to the venotomy incision. At the venotomy, serial dilatation was performed and a 16 Fr peel-away sheath was placed over a guidewire. The catheter was then placed through the sheath and the sheath removed. Final catheter positioning was confirmed and documented with a fluoroscopic spot image. The catheter was aspirated, flushed with saline, and injected with appropriate volume heparin dwells. The venotomy incision was closed with subcutaneous 3-0 Monocryl and subcuticular 4-0 Vicryl. Dermabond was applied to the incision. The catheter exit site was secured with 0-Prolene retention sutures. COMPLICATIONS: None.  No pneumothorax. FINDINGS: After catheter placement, the tip lies in the right atrium. The catheter aspirates normally and is ready for immediate use. IMPRESSION: Placement of tunneled hemodialysis catheter via the right internal jugular vein. The catheter tip lies in the right atrium. The catheter is ready for immediate use. Electronically Signed   By: Aletta Edouard M.D.   On: 12/10/2016 11:14   Ir US Guide Vasc Access Right  Result Date: 12/10/2016 CLINICAL DATA:  End-stage renal disease and failed attempt at restoring flow in an occluded right upper arm dialysis graft. EXAM: TUNNELED CENTRAL VENOUS HEMODIALYSIS CATHETER PLACEMENT WITH ULTRASOUND AND FLUOROSCOPIC GUIDANCE ANESTHESIA/SEDATION: 1.0 mg IV Versed; 25 mcg IV Fentanyl. Total Moderate Sedation Time:  20  minutes. The patient's level of consciousness and physiologic status were continuously monitored during the procedure by Radiology nursing. MEDICATIONS: 2 g IV Ancef. As antibiotic prophylaxis, Ancef was ordered pre-procedure and administered intravenously within one hour of incision. FLUOROSCOPY TIME:  36 seconds.  3.2 mGy. PROCEDURE: The procedure, risks, benefits, and alternatives were explained to the patient. Questions regarding the procedure were  encouraged and answered. The patient understands and consents to the procedure. A timeout was performed prior to initiating the procedure. The right neck and chest were prepped with chlorhexidine in a sterile fashion, and a sterile drape was applied covering the operative field. Maximum barrier sterile technique with sterile gowns and gloves were used for the procedure. Local anesthesia was provided with 1% lidocaine. Ultrasound was used to confirm patency of the right internal jugular vein. After creating a small venotomy incision, a 21 gauge needle was advanced into the right internal jugular vein under direct, real-time ultrasound guidance. Ultrasound image documentation was performed. After securing guidewire access, an 8 Fr dilator was placed. A J-wire was kinked to measure appropriate catheter length. A Palindrome tunneled hemodialysis catheter measuring 19 cm from tip to cuff was chosen for placement. This was tunneled in a retrograde fashion from the chest wall to the venotomy incision. At the venotomy, serial dilatation was performed and a 16 Fr peel-away sheath was placed over a guidewire. The catheter was  then placed through the sheath and the sheath removed. Final catheter positioning was confirmed and documented with a fluoroscopic spot image. The catheter was aspirated, flushed with saline, and injected with appropriate volume heparin dwells. The venotomy incision was closed with subcutaneous 3-0 Monocryl and subcuticular 4-0 Vicryl. Dermabond was applied to the incision. The catheter exit site was secured with 0-Prolene retention sutures. COMPLICATIONS: None.  No pneumothorax. FINDINGS: After catheter placement, the tip lies in the right atrium. The catheter aspirates normally and is ready for immediate use. IMPRESSION: Placement of tunneled hemodialysis catheter via the right internal jugular vein. The catheter tip lies in the right atrium. The catheter is ready for immediate use. Electronically  Signed   By: Aletta Edouard M.D.   On: 12/10/2016 11:14   Ir US Guide Vasc Access Right  Result Date: 12/10/2016 INDICATION: 51 year old female with a history of a right upper extremity dialysis graft, placed earlier this year in [place] before she relocated to [place]. This has recently been initiated with dialysis and was working fine. She is now admitted to [facility] with MRSA bacteremia, with removal of the prior catheter. She has positive blood cultures. Referred for possible restoration of flow within the dialysis graft. EXAM: RIGHT UPPER EXTREMITY DIALYSIS FISTULAGRAM ATTEMPT AT THROMBECTOMY OF FISTULAGRAM WITH PLACEMENT OF COVERED VENOUS STENT AT OUTFLOW STENOSIS. MEDICATIONS: None. ANESTHESIA/SEDATION: Moderate Sedation Time:  105 MINUTES The patient was continuously monitored during the procedure by the interventional radiology nurse under my direct supervision. FLUOROSCOPY TIME:  Fluoroscopy Time: 18 minutes 24 seconds (79 mGy). COMPLICATIONS: None PROCEDURE: Informed written consent was obtained from the patient after a thorough discussion of the procedural risks, benefits and alternatives. Specifically, the risk of septic emboli and possible inability to open the graft given the infection were discussed with the patient, who voiced her understanding. All questions were addressed. Maximal Sterile Barrier Technique was utilized including caps, mask, sterile gowns, sterile gloves, sterile drape, hand hygiene and skin antiseptic. A timeout was performed prior to the initiation of the procedure. Patient positioned supine position on the fluoroscopy table. The right upper extremity was prepped and draped in the usual sterile fashion. Ultrasound images were acquired of the graft. Ultrasound guided access was then used to puncture the graft in the presumed venous outflow direction after 1% lidocaine was used for local anesthesia. Using Seldinger technique, 6 French sheath was placed. Angled  catheter was navigated into the venous outflow, with central venogram performed. Upon withdrawal of the catheter, stenosis was identified in the axillary vein. Approximately 3 mg of tPA solution was then infused through the thrombosed venous outflow. 6 mm balloon mass serration was then performed along the venous outflow, including the stenosis at the venous outflow. 1% lidocaine was then used to anesthetize the skin over the more proximal graft, and ultrasound-guided access was performed towards the arterial anastomosis. Seldinger technique was used to place a 6 [place] sheath. Catheter was navigated into the axillary artery and an angiogram was performed confirming occlusion at the graft at the anastomosis. The remainder of the tPA solution with a total 1 mg in solution was infused into the proximal aspect of the graft. Balloon mass serration was then performed along the proximal aspect. Over the wire Fogarty catheter was passed the arterial anastomosis, balloon was inflated, and withdrawn across the arterial anastomosis on 2 separate occasion. Balloon angioplasty was then performed of the arterial anastomosis to 6 mm. Flow was partially restored though with residual thrombus within the graft. Persisting  stenosis at the venous outflow was then treated to 9 mm diameter after the sheath was replaced with a 7 [place] sheath. Balloon mass serration with the 6 mm balloon was then performed along the length of the graft once more. Flow was temporarily restored at this point after the venous outflow was treated to 9 mm. Flow was then again occluded with recurrent thrombus along the length of the graft. Balloon angioplasty along the length of the graft identified no recurrent stenoses. Adherent thrombus at the venous outflow was identified, and then treated with a covered 7 mm x 40 mm flared stent. Catheter was advanced to the arterial aspect, repeat angiogram was performed confirming diffuse thrombus through the length of  the graft with no recurrent stenosis. Final images demonstrate that flow was not restored. Pursestring sutures were placed at the access sites and the procedure was terminated. Patient tolerated the procedure well and remained hemodynamically stable throughout. No complications were encountered and no significant blood loss. 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 Hemodialysis catheter will be placed. Signed, Dulcy Fanny. Earleen Newport, DO [facility] Radiology ACCESS: The right upper extremity access flow was not restored, likely secondary to infection, and is likely not amenable to further intervention. Electronically Signed   By: Corrie Mckusick D.O.   On: 12/09/2016 19:04   Ir Thrombectomy Av Fistula W/thrombolysis/pta/stent Inc Shunt/img Rt  Result Date: 12/09/2016 INDICATION: 51 year old female with a history of a right upper extremity dialysis graft, placed earlier this year in [place] before she relocated to [place]. This has recently been initiated with dialysis and was working fine. She is now admitted to [facility] with MRSA bacteremia, with removal of the prior catheter. She has positive blood cultures. Referred for possible restoration of flow within the dialysis graft. EXAM: RIGHT UPPER EXTREMITY DIALYSIS FISTULAGRAM ATTEMPT AT THROMBECTOMY OF FISTULAGRAM WITH PLACEMENT OF COVERED VENOUS STENT AT OUTFLOW STENOSIS. MEDICATIONS: None. ANESTHESIA/SEDATION: Moderate Sedation Time:  105 MINUTES The patient was continuously monitored during the procedure by the interventional radiology nurse under my direct supervision. FLUOROSCOPY TIME:  Fluoroscopy Time: 18 minutes 24 seconds (79 mGy). COMPLICATIONS: None PROCEDURE: Informed written consent was obtained from the patient after a thorough discussion of the procedural risks, benefits  and alternatives. Specifically, the risk of septic emboli and possible inability to open the graft given the infection were discussed with the patient, who voiced her understanding. All questions were addressed. Maximal Sterile Barrier Technique was utilized including caps, mask, sterile gowns, sterile gloves, sterile drape, hand hygiene and skin antiseptic. A timeout was performed prior to the initiation of the procedure. Patient positioned supine position on the fluoroscopy table. The right upper extremity was prepped and draped in the usual sterile fashion. Ultrasound images were acquired of the graft. Ultrasound guided access was then used to puncture the graft in the presumed venous outflow direction after 1% lidocaine was used for local anesthesia. Using Seldinger technique, 6 French sheath was placed. Angled catheter was navigated into the venous outflow, with central venogram performed. Upon withdrawal of the catheter, stenosis was identified in the axillary vein. Approximately 3 mg of tPA solution was then infused through the thrombosed venous outflow. 6 mm balloon mass serration was then performed along the venous outflow, including the stenosis at the venous outflow. 1% lidocaine was then used to anesthetize the skin over the more proximal graft, and ultrasound-guided access  was performed towards the arterial anastomosis. Seldinger technique was used to place a 6 [place] sheath. Catheter was navigated into the axillary artery and an angiogram was performed confirming occlusion at the graft at the anastomosis. The remainder of the tPA solution with a total 1 mg in solution was infused into the proximal aspect of the graft. Balloon mass serration was then performed along the proximal aspect. Over the wire Fogarty catheter was passed the arterial anastomosis, balloon was inflated, and withdrawn across the arterial anastomosis on 2 separate occasion. Balloon angioplasty was then performed of the arterial  anastomosis to 6 mm. Flow was partially restored though with residual thrombus within the graft. Persisting stenosis at the venous outflow was then treated to 9 mm diameter after the sheath was replaced with a 7 [place] sheath. Balloon mass serration with the 6 mm balloon was then performed along the length of the graft once more. Flow was temporarily restored at this point after the venous outflow was treated to 9 mm. Flow was then again occluded with recurrent thrombus along the length of the graft. Balloon angioplasty along the length of the graft identified no recurrent stenoses. Adherent thrombus at the venous outflow was identified, and then treated with a covered 7 mm x 40 mm flared stent. Catheter was advanced to the arterial aspect, repeat angiogram was performed confirming diffuse thrombus through the length of the graft with no recurrent stenosis. Final images demonstrate that flow was not restored. Pursestring sutures were placed at the access sites and the procedure was terminated. Patient tolerated the procedure well and remained hemodynamically stable throughout. No complications were encountered and no significant blood loss. 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 Hemodialysis catheter will be placed. Signed, Dulcy Fanny. Earleen Newport, DO [facility] Rad[facility] ACCESS: The right upper extremity access flow was not restored, likely secondary to infection, and is likely not amenable to further intervention. Electronically Signed   By: Corrie Mckusick D.O.   On: 12/09/2016 19:04   [place] Chest Soft Tissue  Result Date: 12/08/2016 CLINICAL DATA:  Post removal of right chest wall dialysis catheter. EXAM: ULTRASOUND OF HEAD/NECK SOFT TISSUES TECHNIQUE: Ultrasound examination of the head and neck soft tissues was performed in the area of clinical  concern. COMPARISON:  None. FINDINGS: Static grayscale images of the area of concern in the right anterior chest wall demonstrate diffuse soft tissue swelling and a tract from prior dialysis catheter removal. No localized fluid collection is seen. IMPRESSION: No evidence of abscess within the right anterior chest wall. Electronically Signed   By: Fidela Salisbury M.D.   On: 12/08/2016 03:21    Echo Study Conclusions - Left ventricle: The cavity size was normal. There was moderate   concentric hypertrophy. Systolic function was normal. The   estimated ejection fraction was in the range of 60% to 65%. Wall   motion was normal; there were no regional wall motion   abnormalities. Features are consistent with a pseudonormal left   ventricular filling pattern, with concomitant abnormal relaxation   and increased filling pressure (grade 2 diastolic dysfunction).   Doppler parameters are consistent with high ventricular filling   pressure. - Aortic valve: Transvalvular velocity was within the normal range.   There was no stenosis. There was no regurgitation. - Mitral valve: Transvalvular velocity was within the normal range.   There was no evidence for stenosis. There  was no regurgitation. - Right ventricle: The cavity size was normal. Wall thickness was   normal. Systolic function was normal. - Tricuspid valve: There was trivial regurgitation.   Discharge Exam: Vitals:   12/13/16 0240 12/13/16 0805  BP: (!) 100/53 (!) 97/50  Pulse: (!) 49 (!) 50  Resp: 18 18  Temp: 98.1 F (36.7 C) 97.9 F (36.6 C)  SpO2: 98% 95%   Vitals:   12/12/16 0859 12/12/16 2146 12/13/16 0240 12/13/16 0805  BP: 100/70 (!) 114/58 (!) 100/53 (!) 97/50  Pulse: (!) 51 (!) 49 (!) 49 (!) 50  Resp: 18 16 18 18   Temp: 98 F (36.7 C) 98.2 F (36.8 C) 98.1 F (36.7 C) 97.9 F (36.6 C)  TempSrc: Oral Oral Oral Oral  SpO2: 100% 95% 98% 95%  Weight:      Height:        General: Pt is alert, awake, not in acute  distress Cardiovascular: RRR, S1/S2 +, no rubs, no gallops Respiratory: CTA bilaterally, no wheezing, no rhonchi Abdominal: Soft, NT, ND, bowel sounds + Extremities: no edema, no cyanosis Skin: ileal conduit stoma with minimal sanguinous drainage on dressing      The results of significant diagnostics from this hospitalization (including imaging, microbiology, ancillary and laboratory) are listed below for reference.     Microbiology: Recent Results (from the past 240 hour(s))  Culture, blood (Routine x 2)     Status: None   Collection Time: 12/03/16  6:20 PM  Result Value Ref Range Status   Specimen Description BLOOD LEFT HAND  Final   Special Requests   Final    Blood Culture adequate volume BOTTLES DRAWN AEROBIC AND ANAEROBIC   Culture NO GROWTH 5 DAYS  Final   Report Status 12/08/2016 FINAL  Final  Culture, blood (Routine x 2)     Status: None   Collection Time: 12/03/16  9:28 PM  Result Value Ref Range Status   Specimen Description BLOOD LEFT ANTECUBITAL  Final   Special Requests   Final    Blood Culture adequate volume BOTTLES DRAWN AEROBIC ONLY   Culture NO GROWTH 5 DAYS  Final   Report Status 12/08/2016 FINAL  Final  MRSA PCR Screening     Status: Abnormal   Collection Time: 12/04/16  1:51 AM  Result Value Ref Range Status   MRSA by PCR POSITIVE (A) NEGATIVE Final    Comment:        The GeneXpert MRSA Assay (FDA approved for NASAL specimens only), is one component of a comprehensive MRSA colonization surveillance program. It is not intended to diagnose MRSA infection nor to guide or monitor treatment for MRSA infections. RESULT CALLED TO, READ BACK BY AND VERIFIED WITH: Fulton Reek RN [phone] 12/04/16 A BROWNING   C difficile quick scan w PCR reflex     Status: None   Collection Time: 12/04/16 10:45 AM  Result Value Ref Range Status   C Diff antigen NEGATIVE NEGATIVE Final   C Diff toxin NEGATIVE NEGATIVE Final   C Diff interpretation No C. difficile detected.   Final  Culture, Urine     Status: None   Collection Time: 12/04/16 10:46 AM  Result Value Ref Range Status   Specimen Description URINE, RANDOM  Final   Special Requests NONE  Final   Culture NO GROWTH  Final   Report Status 12/05/2016 FINAL  Final     Labs: BNP (last 3 results) No results for input(s): BNP in the last 8760 hours.  Basic Metabolic Panel: Recent Labs  Lab 12/09/16 0625 12/10/16 0404 12/11/16 0459 12/12/16 0451 12/13/16 0537  NA 137 137 136 136 136  K 3.7 4.2 4.5 4.3 4.6  CL 98* 98* 99* 98* 99*  CO2 28 26 25 26 25   GLUCOSE 99 94 93 115* 87  BUN 18 25* 22* 38* 53*  CREATININE 6.74* 8.12* 5.84* 7.63* 8.81*  CALCIUM 8.6* 9.1 9.0 8.8* 8.7*   Liver Function Tests: No results for input(s): AST, ALT, ALKPHOS, BILITOT, PROT, ALBUMIN in the last 168 hours. No results for input(s): LIPASE, AMYLASE in the last 168 hours. No results for input(s): AMMONIA in the last 168 hours. CBC: Recent Labs  Lab 12/09/16 0625 12/10/16 0404 12/11/16 0459 12/12/16 0451 12/13/16 0537  WBC 7.0 7.4 7.7 7.0 7.0  HGB 8.5* 8.5* 8.3* 8.4* 8.2*  HCT 27.8* 27.6* 26.7* 26.6* 26.3*  MCV 100.0 101.1* 99.3 100.0 98.5  PLT 334 326 274 279 320   Cardiac Enzymes: No results for input(s): CKTOTAL, CKMB, CKMBINDEX, TROPONINI in the last 168 hours. BNP: Invalid input(s): POCBNP CBG: No results for input(s): GLUCAP in the last 168 hours. D-Dimer No results for input(s): DDIMER in the last 72 hours. Hgb A1c No results for input(s): HGBA1C in the last 72 hours. Lipid Profile No results for input(s): CHOL, HDL, LDLCALC, TRIG, CHOLHDL, LDLDIRECT in the last 72 hours. Thyroid function studies No results for input(s): TSH, T4TOTAL, T3FREE, THYROIDAB in the last 72 hours.  Invalid input(s): FREET3 Anemia work up No results for input(s): VITAMINB12, FOLATE, FERRITIN, TIBC, IRON, RETICCTPCT in the last 72 hours. Urinalysis    Component Value Date/Time   COLORURINE YELLOW 12/03/2016 1955    APPEARANCEUR HAZY (A) 12/03/2016 1955   LABSPEC 1.010 12/03/2016 1955   PHURINE 9.0 (H) 12/03/2016 1955   GLUCOSEU NEGATIVE 12/03/2016 1955   HGBUR SMALL (A) 12/03/2016 [place] NEGATIVE 12/03/2016 [place] NEGATIVE 12/03/2016 1955   PROTEINUR 100 (A) 12/03/2016 1955   NITRITE NEGATIVE 12/03/2016 1955   LEUKOCYTESUR LARGE (A) 12/03/2016 1955   Sepsis Labs Invalid input(s): PROCALCITONIN,  WBC,  LACTICIDVEN Microbiology Recent Results (from the past 240 hour(s))  Culture, blood (Routine x 2)     Status: None   Collection Time: 12/03/16  6:20 PM  Result Value Ref Range Status   Specimen Description BLOOD LEFT HAND  Final   Special Requests   Final    Blood Culture adequate volume BOTTLES DRAWN AEROBIC AND ANAEROBIC   Culture NO GROWTH 5 DAYS  Final   Report Status 12/08/2016 FINAL  Final  Culture, blood (Routine x 2)     Status: None   Collection Time: 12/03/16  9:28 PM  Result Value Ref Range Status   Specimen Description BLOOD LEFT ANTECUBITAL  Final   Special Requests   Final    Blood Culture adequate volume BOTTLES DRAWN AEROBIC ONLY   Culture NO GROWTH 5 DAYS  Final   Report Status 12/08/2016 FINAL  Final  MRSA PCR Screening     Status: Abnormal   Collection Time: 12/04/16  1:51 AM  Result Value Ref Range Status   MRSA by PCR POSITIVE (A) NEGATIVE Final    Comment:        The GeneXpert MRSA Assay (FDA approved for NASAL specimens only), is one component of a comprehensive MRSA colonization surveillance program. It is not intended to diagnose MRSA infection nor to guide or monitor treatment for MRSA infections. RESULT CALLED TO, READ BACK BY  AND VERIFIED WITH: Fulton Reek RN [id number] 12/04/16 A BROWNING   C difficile quick scan w PCR reflex     Status: None   Collection Time: 12/04/16 10:45 AM  Result Value Ref Range Status   C Diff antigen NEGATIVE NEGATIVE Final   C Diff toxin NEGATIVE NEGATIVE Final   C Diff interpretation No C. difficile detected.   Final  Culture, Urine     Status: None   Collection Time: 12/04/16 10:46 AM  Result Value Ref Range Status   Specimen Description URINE, RANDOM  Final   Special Requests NONE  Final   Culture NO GROWTH  Final   Report Status 12/05/2016 FINAL  Final     Time coordinating discharge: 40 minutes  SIGNED:  Dessa Phi, DO [facility] Pager [phone]  If 7PM-7AM, please contact night-coverage [email or web address] Password [email or web address] 12/13/2016, 10:16 AM

## 2016-12-13 NOTE — Progress Notes (Signed)
Physical Therapy Treatment Patient Details Name: Judy Kelley MRN: [id number] DOB: 20-Jan-1965 Today's Date: 12/13/2016    History of Present Illness Pt is a 51 y/o female admitted secondary to MRSA bacteremia. Pt's R subclavian dialysis catheter was removed on 11/30 with tunneled R IJ HD catheter placed on 12/6. PMH including but not limited to ESRD (HD on T-Th-S), chrnoic R nephrostomy tube and hx of ovarian cancer in the early 2000's (in remission).    PT Comments    Patient seen for activity progression, tolerated well. No nausea or vomiting today. Will continue with current POC.   Follow Up Recommendations  Home health PT;Supervision - Intermittent     Equipment Recommendations  None recommended by PT    Recommendations for Other Services       Precautions / Restrictions Precautions Precautions: None Precaution Comments: nephrostomy drain (posterior R) Restrictions Weight Bearing Restrictions: No    Mobility  Bed Mobility Overal bed mobility: Needs Assistance Bed Mobility: Supine to Sit     Supine to sit: Supervision     General bed mobility comments: for safety  Transfers Overall transfer level: Needs assistance Equipment used: None Transfers: Sit to/from Stand Sit to Stand: Supervision         General transfer comment: for safety  Ambulation/Gait Ambulation/Gait assistance: Supervision Ambulation Distance (Feet): 160 Feet Assistive device: None Gait Pattern/deviations: Step-through pattern Gait velocity: decreased Gait velocity interpretation: Below normal speed for age/gender General Gait Details: modest instability but overall mobilizing well, slow and cautious with gait   Stairs            Wheelchair Mobility    Modified Rankin (Stroke Patients Only)       Balance Overall balance assessment: Needs assistance Sitting-balance support: Feet supported Sitting balance-Leahy Scale: Good     Standing balance support: During  functional activity;No upper extremity supported Standing balance-Leahy Scale: Good                              Cognition Arousal/Alertness: Awake/alert Behavior During Therapy: Flat affect Overall Cognitive Status: Within Functional Limits for tasks assessed                                        Exercises      General Comments        Pertinent Vitals/Pain Pain Assessment: 0-10 Pain Score: 4  Pain Location: arm and back Pain Descriptors / Indicators: Sore Pain Intervention(s): Monitored during session    Home Living                      Prior Function            PT Goals (current goals can now be found in the care plan section) Acute Rehab PT Goals Patient Stated Goal: return home PT Goal Formulation: With patient Time For Goal Achievement: 12/25/16 Potential to Achieve Goals: Good Progress towards PT goals: Progressing toward goals    Frequency    Min 3X/week      PT Plan Current plan remains appropriate    Co-evaluation              AM-PAC PT "6 Clicks" Daily Activity  Outcome Measure  Difficulty turning over in bed (including adjusting bedclothes, sheets and blankets)?: None Difficulty moving from lying on back to sitting on the side  of the bed? : None Difficulty sitting down on and standing up from a chair with arms (e.g., wheelchair, bedside commode, etc,.)?: A Little Help needed moving to and from a bed to chair (including a wheelchair)?: None Help needed walking in hospital room?: A Little Help needed climbing 3-5 steps with a railing? : A Little 6 Click Score: 21    End of Session   Activity Tolerance: Patient tolerated treatment well Patient left: in chair;with call bell/phone within reach Nurse Communication: Mobility status;Other (comment) PT Visit Diagnosis: Other abnormalities of gait and mobility (R26.89)     Time: 0902-0920 PT Time Calculation (min) (ACUTE ONLY): 18 min  Charges:   $Gait Training: 8-22 mins                    G Codes:       Alben Deeds, PT DPT  Board Certified Neurologic Specialist [place] 12/13/2016, 10:12 AM

## 2016-12-13 NOTE — Progress Notes (Signed)
[facility] Progress Note   Dialysis Orders: [facility] 4h   117.5kg   2/2 bath  RUA AVG (now clotted)  Hep 5000 [facility] 4 hr EDW 117.5 2 K 2 Ca AVGG heparin 5000  -venofer 50  -mircera 100 q 2 weeks - last dose 11/27 -calcitriol 1 -no Fe -recent labs: hgb 7.7 dropping 11% sat, no ferritin, no K ipTH 461  Home meds: -eliquis 5 bid/ phoslo 2-3 ac/ sensipar 90 qd/ oxy IR prn/ phenergan prn/ zoloft 50   Assessment/Plan: 1. Clotted RUA AVG: unfortunately could not be declotted, new R IJ TDC placed 12/6.  Will need eval for access once antibiotics completed. 2. MRSA bacteremia thought secondary to catheter sepsis (+ BC and perm cath exit drainage cultures 11/27), R IJ cath out 11/30/1/8.  On IV Vanco.ID seeing, rec 2 weeks of IV vanc (until 12/13)) TTE without vegetations.  F/U blood cultures 11/30 negative.Chest [place] no abscess 3. ESRD- usual TTS HD. Was off sched last week, plan next HD Tuesday first round in anticipation of d/c; plan tight heparin for discharge 4. HTN/ vol - no BP meds at home or here. BP's soft.  Just under dry wt, no vol excess on exam.  5. Anemiaof CKD- Hgb 8-9 range, s/p 1U PRBC 12/2 /Fe def anemia - tsat 11%OP held fe load sec Bacteremia -Mircera last dose100 11/30/16 - next due 12/14/16 increased to 200; I think we can't start on course of IV Fe now and continue after d/c   6. Metabolic bone disease- Continue calcitriol, sensipar 90, calcium acetate 7. Nutrition- alb 3.1holding Nepro d/t diarrhea, appetite improving  8. Hx DVT - was on Eliquison admit /had been on coumadin in remotely in the past/ eliquis resumed 12/10 had been on hold for bleeding from urostomy/ ileal conduit 9. 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 Hx ovarianvs cervicalcancers/p prior chemo/radiation( In [place]. )but not surgery- "cancer free since 2000" no records available.  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 Has chronic R perc neph tube draining R kidney. Urology has seen   Myriam Jacobson, PA-C [place] [phone] 12/13/2016,11:17 AM  LOS: 10 days   Pt seen, examined and agree w A/P as above.  Kelly Splinter MD [employer] pager [phone]   12/13/2016, 1:34 PM   ] Subjective:   Thinks she could go home but ride can't get here. Eating is going better  Objective Vitals:   12/12/16 0859 12/12/16 2146 12/13/16 0240 12/13/16 0805  BP: 100/70 (!) 114/58 (!) 100/53 (!) 97/50  Pulse: (!) 51 (!) 49 (!) 49 (!) 50  Resp: 18 16 18 18   Temp: 98 F (36.7 C) 98.2 F (36.8 C) 98.1 F (36.7 C) 97.9 F (36.6 C)  TempSrc: Oral Oral Oral Oral  SpO2: 100% 95% 98% 95%  Weight:      Height:       Physical Exam General: NAD sitting up in bed Heart: RRR Lungs: no rales Abdomen: soft NT - dressing intact Extremities: no sig edema Dialysis Access:  Clotted right upper AVGG - active right IJ [facility]   Additional Objective Labs: Basic Metabolic Panel: Recent Labs  Lab 12/11/16 0459 12/12/16 0451 12/13/16 0537  NA 136 136 136  K 4.5 4.3 4.6  CL 99* 98* 99*  CO2 25 26 25   GLUCOSE 93  115* 87  BUN 22* 38* 53*  CREATININE 5.84* 7.63* 8.81*  CALCIUM 9.0 8.8* 8.7*   Liver Function Tests: No results for input(s): AST, ALT, ALKPHOS, BILITOT, PROT, ALBUMIN in the last 168 hours. No results for input(s): LIPASE, AMYLASE in the last 168 hours. CBC: Recent Labs  Lab 12/09/16 0625 12/10/16 0404 12/11/16 0459 12/12/16 0451 12/13/16 0537  WBC 7.0 7.4 7.7 7.0 7.0  HGB 8.5* 8.5* 8.3* 8.4* 8.2*  HCT 27.8* 27.6* 26.7* 26.6* 26.3*  MCV 100.0 101.1* 99.3 100.0 98.5  PLT 334 326 274 279 320   Blood Culture    Component Value Date/Time   SDES URINE, RANDOM 12/04/2016 1046   SPECREQUEST NONE 12/04/2016 1046   CULT NO GROWTH 12/04/2016 1046    REPTSTATUS 12/05/2016 FINAL 12/04/2016 1046    Cardiac Enzymes: No results for input(s): CKTOTAL, CKMB, CKMBINDEX, TROPONINI in the last 168 hours. CBG: No results for input(s): GLUCAP in the last 168 hours. Iron Studies: No results for input(s): IRON, TIBC, TRANSFERRIN, FERRITIN in the last 72 hours. Lab Results  Component Value Date   INR 1.14 12/03/2016   Studies/Results: No results found. Medications: . vancomycin     . apixaban  5 mg Oral BID  . calcitRIOL  1 mcg Oral Q M,W,F-HD  . calcium acetate  1,334 mg Oral TID WC  . cinacalcet  90 mg Oral Q breakfast  . [START ON 12/14/2016] darbepoetin (ARANESP) injection - DIALYSIS  200 mcg Intravenous Q Tue-HD  . feeding supplement (PRO-STAT SUGAR FREE 64)  30 mL Oral TID  . multivitamin  1 tablet Oral QHS  . nystatin   Topical TID  . sertraline  50 mg Oral Daily

## 2016-12-13 NOTE — Discharge Instructions (Signed)

## 2016-12-14 LAB — CBC
HCT: 25.7 % — ABNORMAL LOW (ref 36.0–46.0)
Hemoglobin: 8.1 g/dL — ABNORMAL LOW (ref 12.0–15.0)
MCH: 31.2 pg (ref 26.0–34.0)
MCHC: 31.5 g/dL (ref 30.0–36.0)
MCV: 98.8 fL (ref 78.0–100.0)
PLATELETS: 307 10*3/uL (ref 150–400)
RBC: 2.6 MIL/uL — ABNORMAL LOW (ref 3.87–5.11)
RDW: 14.2 % (ref 11.5–15.5)
WBC: 6.4 10*3/uL (ref 4.0–10.5)

## 2016-12-14 LAB — BASIC METABOLIC PANEL
Anion gap: 11 (ref 5–15)
BUN: 62 mg/dL — AB (ref 6–20)
CHLORIDE: 100 mmol/L — AB (ref 101–111)
CO2: 23 mmol/L (ref 22–32)
CREATININE: 9.63 mg/dL — AB (ref 0.44–1.00)
Calcium: 8.8 mg/dL — ABNORMAL LOW (ref 8.9–10.3)
GFR calc Af Amer: 5 mL/min — ABNORMAL LOW (ref >60)
GFR calc non Af Amer: 4 mL/min — ABNORMAL LOW (ref >60)
Glucose, Bld: 84 mg/dL (ref 65–99)
Potassium: 4.6 mmol/L (ref 3.5–5.1)
SODIUM: 134 mmol/L — AB (ref 135–145)

## 2016-12-14 LAB — VANCOMYCIN, RANDOM: VANCOMYCIN RM: 16

## 2016-12-14 MED ORDER — DARBEPOETIN ALFA 200 MCG/0.4ML IJ SOSY
PREFILLED_SYRINGE | INTRAMUSCULAR | Status: DC
Start: 2016-12-14 — End: 2016-12-14
  Filled 2016-12-14: qty 0.4

## 2016-12-14 NOTE — Progress Notes (Signed)
[facility] Progress Note   Dialysis Orders: [facility] 4h 117.5kg 2/2 bath RUA AVG (now clotted) Hep 5000 [facility] 4 hr EDW 117.5 2 K 2 Ca AVGG heparin 5000 -venofer 50 -mircera 100 q 2 weeks - last dose 11/27 -calcitriol 1 -no Fe -recent labs: hgb 7.7 dropping 11% sat, no ferritin, no K ipTH 461  Home meds: -eliquis 5 bid/ phoslo 2-3 ac/ sensipar 90 qd/ oxy IR prn/ phenergan prn/ zoloft 50  Assessment/Plan: 1. Clotted RUA AVG: unfortunately could not be declotted, new R IJ TDC placed 12/6. Will need eval for access once antibiotics completed. 2. MRSA bacteremia thought secondary to catheter sepsis (+ BC and perm cath exit drainage cultures 11/27),R IJ cathout11/30/1/8. On IV Vanco.ID seeing, rec 2 weeks of IV vanc (until 12/13)) TTE without vegetations. F/Ublood cultures 11/30 negative.Chest USno abscess 3. ESRD- usual TTS HD.  K 4.6  plan hold heparin for d/c given oozing from dressing this am and on eliquis 4. HTN/ vol - no BP meds at home or here. BP's soft.  below EDW keep even for the remainder of treatment BP into 80s 5. Anemiaof CKD- Hgb 8.1,s/p 1U PRBC 12/2 /Fe def anemia - tsat 11%OP held fe load sec Bacteremia -Mircera last dose100 11/30/16 -next due12/11/18 increased to 200 today; replete Fe now  6. Metabolic bone disease- Continue calcitriol, sensipar 90, calcium acetate 7. Nutrition- alb 3.1holding Nepro d/t diarrhea, appetite improving  8. Hx DVT - was on Eliquison admit /had been on coumadin in remotely in the past/eliquis resumed 12/10 had been on hold for bleeding from urostomy/ ileal conduit 9. Bleeding from ileal conduit site/ abd wound- restarted IV hep to see if will tolerate- has slight ooze from abd wound 10. Hx ovarianvs cervicalcancers/p prior chemo/radiation( In [place]. )but not surgery- "cancer free since 2000" no records available. Complicated urological history, has old urostomy which is no longer  functional due to ureteral damage from radiation given for cancer remotely. Has chronic R perc neph tube draining R kidney. Urology has seen 11. Bradycardia - noted to be bradycardic throughout admission- off BP meds; needs cardiology eval of this after d/c 12. Disp - for d/c today  Myriam Jacobson, PA-C [place] [phone] 12/14/2016,8:20 AM  LOS: 11 days   Pt seen, examined and agree w A/P as above.  Kelly Splinter MD [employer] pager [phone]   12/14/2016, 1:42 PM    Subjective:   Some dizziness when up walking about. Some bleeding  Objective Vitals:   12/14/16 0715 12/14/16 0730 12/14/16 0745 12/14/16 0800  BP: (!) 100/54 (!) 90/55 (!) 94/53 (!) 89/50  Pulse: (!) 43 (!) 46 (!) 44 (!) 45  Resp: 10     Temp:      TempSrc:      SpO2:      Weight:      Height:       Physical Exam General: pale NAD on HD breathing easily Heart: bradycardic 40 - 50s Lungs: no rales Abdomen: obese , still with bloody drain mid abd wound - bright red Extremities: no edema Dialysis Access:  Right IJ [facility]   Additional Objective Labs: Basic Metabolic Panel: Recent Labs  Lab 12/12/16 0451 12/13/16 0537 12/14/16 0606  NA 136 136 134*  K 4.3 4.6 4.6  CL 98* 99* 100*  CO2 26 25 23   GLUCOSE 115* 87 84  BUN 38* 53* 62*  CREATININE 7.63* 8.81* 9.63*  CALCIUM 8.8* 8.7* 8.8*  CBC: Recent Labs  Lab 12/10/16 0404 12/11/16 0459 12/12/16 0451 12/13/16 0537 12/14/16 0606  WBC 7.4 7.7 7.0 7.0 6.4  HGB 8.5* 8.3* 8.4* 8.2* 8.1*  HCT 27.6* 26.7* 26.6* 26.3* 25.7*  MCV 101.1* 99.3 100.0 98.5 98.8  PLT 326 274 279 320 307   Blood Culture    Component Value Date/Time   SDES URINE, RANDOM 12/04/2016 1046   SPECREQUEST NONE 12/04/2016 1046   CULT NO GROWTH 12/04/2016 1046   REPTSTATUS 12/05/2016 FINAL 12/04/2016 1046     Lab Results  Component Value Date   INR 1.14 12/03/2016   Studies/Results: No results found. Medications: . ferric  gluconate (FERRLECIT/NULECIT) IV    . vancomycin     . apixaban  5 mg Oral BID  . calcitRIOL  1 mcg Oral Q M,W,F-HD  . calcium acetate  1,334 mg Oral TID WC  . cinacalcet  90 mg Oral Q breakfast  . darbepoetin (ARANESP) injection - DIALYSIS  200 mcg Intravenous Q Tue-HD  . feeding supplement (PRO-STAT SUGAR FREE 64)  30 mL Oral TID  . multivitamin  1 tablet Oral QHS  . nystatin   Topical TID  . sertraline  50 mg Oral Daily

## 2016-12-14 NOTE — Progress Notes (Signed)
Pharmacy Antibiotic Note  Judy Kelley is a 51 y.o. female admitted on 12/03/2016 with bacteremia.  Pharmacy has been consulted for vancomycin dosing. 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 Per written records, patient brought from HD: blood cx drawn on 11/27 and catheter tip culture from 11/28 + for staph aureus. She was started on vancomycin as an outpatient on 11/27 per notes. Has been off schedule with HD d/t issues with access - was unable to get declot of fistula and required HD cath placement on 12/6. Went for HD on 12/7 (4h @ BFR 300) and 12/11 (currently mid-session). Received vancomycin after HD on 12/7, scheduled to receive another dose today. Random vancomycin level drawn this AM = 16 mcg/mL.  Plan: Random pre-HD vancomycin level within goal range of 15-25 mg/mL. Continue vancomycin 1g IV qHD-TTSat; last dose planned 12/13 for 2 weeks total treatment. F/u HD schedule and tolerance.   Height: 5\' 7"  (170.2 cm) Weight: 254 lb 3.1 oz (115.3 kg) IBW/kg (Calculated) : 61.6  Temp (24hrs), Avg:98 F (36.7 C), Min:97.7 F (36.5 C), Max:98.2 F (36.8 C)  Recent Labs  Lab 12/10/16 0404 12/11/16 0459 12/12/16 0451 12/13/16 0537 12/14/16 0606 12/14/16 0715  WBC 7.4 7.7 7.0 7.0 6.4  --   CREATININE 8.12* 5.84* 7.63* 8.81* 9.63*  --   VANCORANDOM  --   --   --   --   --  16    Estimated Creatinine Clearance: 9 mL/min (A) (by C-G formula based on SCr of 9.63 mg/dL (H)).    Allergies  Allergen Reactions  . Erythromycin Anaphylaxis  . Ciprofloxacin     IV only-burning to her veins, can take PO  . Chlorhexidine Rash  . Other Rash    chloraprep    Antimicrobials this admission: Vancomycin 11/27 (PTA) >> (12/13) 12/1 VR = 20  12/11 VR = 16  Microbiology results: 12/1 CDiff: neg for toxin and antigen 12/1: UCx: neg 11/30: BCx: neg 11/30 MRSA: PCR positive  11/27 (OSH) BCx: MRSA  11/28 (OSH) cath tip culture: MRSA  Erin N. Gerarda Fraction, PharmD PGY1 Pharmacy Resident Pager:  [phone] 12/14/2016 8:18 AM
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
## 2016-12-14 NOTE — Progress Notes (Signed)
Patient discharged to home with family. IV's removed. Telemetry removed.  All discharge instructions reviewed. All belongings with the patient. Patient left the unit in stable condition via wheelchair.  Sheliah Plane RN

## 2016-12-23 ENCOUNTER — Other Ambulatory Visit: Payer: Self-pay | Admitting: Urology

## 2016-12-23 DIAGNOSIS — R310 Gross hematuria: Secondary | ICD-10-CM

## 2016-12-23 DIAGNOSIS — Z8551 Personal history of malignant neoplasm of bladder: Secondary | ICD-10-CM

## 2017-01-21 ENCOUNTER — Encounter (HOSPITAL_COMMUNITY): Payer: Self-pay | Admitting: Diagnostic Radiology

## 2017-01-21 ENCOUNTER — Ambulatory Visit (HOSPITAL_COMMUNITY)
Admission: RE | Admit: 2017-01-21 | Discharge: 2017-01-21 | Disposition: A | Discharge status: Home / Self Care | Payer: Medicare Other | Source: Ambulatory Visit | Attending: Urology | Admitting: Urology

## 2017-01-21 ENCOUNTER — Other Ambulatory Visit (HOSPITAL_COMMUNITY): Payer: Self-pay | Admitting: Diagnostic Radiology

## 2017-01-21 ENCOUNTER — Other Ambulatory Visit: Payer: Self-pay | Admitting: Urology

## 2017-01-21 DIAGNOSIS — Z8551 Personal history of malignant neoplasm of bladder: Secondary | ICD-10-CM

## 2017-01-21 DIAGNOSIS — R310 Gross hematuria: Secondary | ICD-10-CM | POA: Diagnosis not present

## 2017-01-21 DIAGNOSIS — Z436 Encounter for attention to other artificial openings of urinary tract: Secondary | ICD-10-CM | POA: Diagnosis not present

## 2017-01-21 HISTORY — PX: IR NEPHROSTOMY EXCHANGE RIGHT: IMG6070

## 2017-01-21 MED ORDER — IOPAMIDOL (ISOVUE-300) INJECTION 61%
INTRAVENOUS | Status: AC
  Administered 2017-01-21: 10 mL
  Filled 2017-01-21: qty 50

## 2017-01-21 MED ORDER — IOPAMIDOL (ISOVUE-300) INJECTION 61%
10.0000 mL | Freq: Once | INTRAVENOUS | Status: AC | PRN
  Administered 2017-01-21: 10 mL

## 2017-01-21 MED ORDER — LIDOCAINE HCL 1 % IJ SOLN
INTRAMUSCULAR | Status: AC
  Filled 2017-01-21: qty 20

## 2017-01-21 NOTE — Procedures (Signed)
Successful exchange of right nephrostomy.  14 Fr tube.  No bleeding and no immediate complication.   Plan for 3 month routine exchange.

## 2017-01-25 ENCOUNTER — Other Ambulatory Visit: Payer: Self-pay

## 2017-01-25 DIAGNOSIS — Z992 Dependence on renal dialysis: Principal | ICD-10-CM

## 2017-01-25 DIAGNOSIS — N186 End stage renal disease: Secondary | ICD-10-CM

## 2017-02-16 ENCOUNTER — Inpatient Hospital Stay (HOSPITAL_COMMUNITY): Admission: RE | Admit: 2017-02-16 | Payer: Medicare Other | Source: Ambulatory Visit

## 2017-02-16 ENCOUNTER — Encounter (HOSPITAL_COMMUNITY): Payer: Medicare Other

## 2017-02-16 ENCOUNTER — Other Ambulatory Visit (HOSPITAL_COMMUNITY): Payer: Medicare Other

## 2017-02-18 ENCOUNTER — Ambulatory Visit (HOSPITAL_COMMUNITY)
Admission: RE | Admit: 2017-02-18 | Discharge: 2017-02-18 | Disposition: A | Discharge status: Home / Self Care | Payer: Medicare Other | Source: Ambulatory Visit | Attending: Vascular Surgery | Admitting: Vascular Surgery

## 2017-02-18 ENCOUNTER — Ambulatory Visit (INDEPENDENT_AMBULATORY_CARE_PROVIDER_SITE_OTHER)
Admission: RE | Admit: 2017-02-18 | Discharge: 2017-02-18 | Disposition: A | Discharge status: Home / Self Care | Payer: Medicare Other | Source: Ambulatory Visit | Attending: Vascular Surgery | Admitting: Vascular Surgery

## 2017-02-18 DIAGNOSIS — N186 End stage renal disease: Secondary | ICD-10-CM | POA: Diagnosis present

## 2017-02-18 DIAGNOSIS — Z992 Dependence on renal dialysis: Secondary | ICD-10-CM | POA: Diagnosis not present

## 2017-02-23 ENCOUNTER — Ambulatory Visit (INDEPENDENT_AMBULATORY_CARE_PROVIDER_SITE_OTHER): Payer: Medicare Other | Admitting: Vascular Surgery

## 2017-02-23 ENCOUNTER — Encounter: Payer: Self-pay | Admitting: Vascular Surgery

## 2017-02-23 ENCOUNTER — Encounter: Payer: Self-pay | Admitting: *Deleted

## 2017-02-23 ENCOUNTER — Other Ambulatory Visit: Payer: Self-pay | Admitting: *Deleted

## 2017-02-23 VITALS — BP 124/80 | HR 74 | Temp 97.1°F | Resp 18 | Ht 67.0 in | Wt 253.0 lb

## 2017-02-23 DIAGNOSIS — N186 End stage renal disease: Secondary | ICD-10-CM

## 2017-02-23 DIAGNOSIS — Z992 Dependence on renal dialysis: Secondary | ICD-10-CM | POA: Diagnosis not present

## 2017-02-23 NOTE — H&P (View-Only) (Signed)
Patient name: Judy Kelley MRN: [id number] DOB: July 24, 1965 Sex: female   REASON FOR CONSULT:    To evaluate for new hemodialysis access.  The consult is requested by Dr. Pearson Grippe.  HPI:   Judy Kelley is a pleasant 52 y.o. female, who has been on dialysis via a tunneled dialysis catheter for about a year and a half.  She has had upper arm graft in both arms done elsewhere.  These did not work for long.  Currently she has a catheter which is not been working well and this is scheduled to be changed on Friday.  She dialyzes on Tuesdays Thursdays and Saturdays.  She denies any recent uremic symptoms.  Specifically, she denies nausea, vomiting, fatigue, anorexia, and palpitations.  She denies any claudication or rest pain in her lower extremities.  She had a history of ovarian and uterine cancer and the renal failure as a complication of her radiation therapy according to the patient.  She has a urostomy tube.  She has a wound on her lower abdomen on the right.  Past Medical History:  Diagnosis Date  . Anxiety   . Cervical cancer ([place])   . DVT (deep venous thrombosis) ([place])   . Renal disorder     Family History  Problem Relation Age of Onset  . Diabetes Mellitus II Mother   . Kidney disease Mother   . Heart disease Mother   . Diabetes Mellitus II Father   . Heart disease Father   . Ovarian cancer Maternal Aunt     SOCIAL HISTORY: Social History   Socioeconomic History  . Marital status: Single    Spouse name: Not on file  . Number of children: Not on file  . Years of education: Not on file  . Highest education level: Not on file  Social Needs  . Financial resource strain: Not on file  . Food insecurity - worry: Not on file  . Food insecurity - inability: Not on file  . Transportation needs - medical: Not on file  . Transportation needs - non-medical: Not on file  Occupational History  . Not on file  Tobacco Use  . Smoking status: Never Smoker  . Smokeless  tobacco: Never Used  Substance and Sexual Activity  . Alcohol use: No    Frequency: Never  . Drug use: No  . Sexual activity: Not on file  Other Topics Concern  . Not on file  Social History Narrative  . Not on file    Allergies  Allergen Reactions  . Erythromycin Anaphylaxis  . Ciprofloxacin     IV only-burning to her veins, can take PO  . Chlorhexidine Rash  . Other Rash    chloraprep    Current Outpatient Medications  Medication Sig Dispense Refill  . acetaminophen (TYLENOL) 500 MG tablet Take 1,000 mg by mouth every 6 (six) hours as needed for mild pain.    Marland Kitchen apixaban (ELIQUIS) 5 MG TABS tablet Take 5 mg by mouth 2 (two) times daily.    . calcium acetate (PHOSLO) 667 MG capsule Take 667-1,334 mg by mouth See admin instructions. Takes 1334 with meals and 667 with snacks    . cinacalcet (SENSIPAR) 90 MG tablet Take 90 mg by mouth daily.    . promethazine (PHENERGAN) 25 MG tablet Take 25 mg by mouth every 6 (six) hours as needed for nausea or vomiting.    . sertraline (ZOLOFT) 50 MG tablet Take 50 mg by mouth daily.    Marland Kitchen  oxycodone (OXY-IR) 5 MG capsule Take 1 capsule (5 mg total) by mouth every 6 (six) hours as needed for pain. (Patient not taking: Reported on 02/23/2017) 12 capsule 0   No current facility-administered medications for this visit.     REVIEW OF SYSTEMS:  [X]  denotes positive finding, [ ]  denotes negative finding Cardiac  Comments:  Chest pain or chest pressure:    Shortness of breath upon exertion: x   Short of breath when lying flat:    Irregular heart rhythm:        Vascular    Pain in calf, thigh, or hip brought on by ambulation:    Pain in feet at night that wakes you up from your sleep:     Blood clot in your veins:    Leg swelling:         Pulmonary    Oxygen at home:    Productive cough:     Wheezing:         Neurologic    Sudden weakness in arms or legs:     Sudden numbness in arms or legs:     Sudden onset of difficulty speaking or  slurred speech:    Temporary loss of vision in one eye:     Problems with dizziness:         Gastrointestinal    Blood in stool:     Vomited blood:         Genitourinary    Burning when urinating:     Blood in urine:        Psychiatric    Major depression:         Hematologic    Bleeding problems:    Problems with blood clotting too easily: x       Skin    Rashes or ulcers:        Constitutional    Fever or chills:     PHYSICAL EXAM:   Vitals:   02/23/17 1209  BP: 124/80  Pulse: 74  Resp: 18  Temp: (!) 97.1 F (36.2 C)  TempSrc: Oral  SpO2: 100%  Weight: 253 lb (114.8 kg)  Height: 5\' 7"  (1.702 m)    GENERAL: The patient is a well-nourished female, in no acute distress. The vital signs are documented above. CARDIAC: There is a regular rate and rhythm.  VASCULAR: I do not detect carotid bruits. She has palpable femoral pulses bilaterally.  She has palpable posterior tibial pulses bilaterally. She has a biphasic dorsalis pedis and posterior tibial signal bilaterally. PULMONARY: There is good air exchange bilaterally without wheezing or rales. ABDOMEN: Soft and non-tender with normal pitched bowel sounds.  She has a urostomy in her right flank.  There is a small open wound that is chronic in her lower abdomen on the right. MUSCULOSKELETAL: There are no major deformities or cyanosis. NEUROLOGIC: No focal weakness or paresthesias are detected. SKIN: There are no ulcers or rashes noted. PSYCHIATRIC: The patient has a normal affect.  DATA:    BILATERAL UPPER EXTREMITY VEIN MAP: I have reviewed the vein map that was done on 02/18/2017.  The patient does not appear to have an adequate vein in either arm for a fistula.  BILATERAL UPPER EXTREMITY ARTERIAL DUPLEX: I have reviewed the arterial duplex scan that was done on 02/18/2017.  On the right side the brachial artery measures 0.34 cm in diameter.  There is a triphasic radial and ulnar waveform on the right.  On the left  side  the brachial artery measures 0.27 cm in diameter.  There is a biphasic radial signal on the left and a triphasic ulnar signal.    MEDICAL ISSUES:   END-STAGE RENAL DISEASE: This patient has exhausted her upper extremity options.  She had a DVT in the left lower extremity twice.  This reason I think her next best option for access would be a right thigh AV graft.  She does have a palpable posterior tibial pulse and biphasic Doppler signals in the right foot so I think she is a reasonable candidate.  We have discussed the indications for the procedure and the potential complications and she is agreeable to proceed.  She dialyzes on Tuesdays Thursdays and Saturdays so we will have to arrange this on a Monday.  Because of her son's wedding she does not want to schedule this until 03/21/2017.  We will have to stop hold her Eliquis for 48 hours prior to the procedure.  I think we will be able to stay away from her wound in her lower abdomen.  We will keep her overnight and then she can be discharged first thing in the morning so she can make it to her outpatient center on [address] by [facility] [phone]

## 2017-02-23 NOTE — Progress Notes (Signed)
Patient name: Judy Kelley MRN: [id number] DOB: [birth year] Sex: female   REASON FOR CONSULT:    To evaluate for new hemodialysis access.  The consult is requested by Dr. Pearson Grippe.  HPI:   Jamelia Varano is a pleasant 52 y.o. female, who has been on dialysis via a tunneled dialysis catheter for about a year and a half.  She has had upper arm graft in both arms done elsewhere.  These did not work for long.  Currently she has a catheter which is not been working well and this is scheduled to be changed on Friday.  She dialyzes on Tuesdays Thursdays and Saturdays.  She denies any recent uremic symptoms.  Specifically, she denies nausea, vomiting, fatigue, anorexia, and palpitations.  She denies any claudication or rest pain in her lower extremities.  She had a history of ovarian and uterine cancer and the renal failure as a complication of her radiation therapy according to the patient.  She has a urostomy tube.  She has a wound on her lower abdomen on the right.  Past Medical History:  Diagnosis Date  . Anxiety   . Cervical cancer ([place])   . DVT (deep venous thrombosis) ([place])   . Renal disorder     Family History  Problem Relation Age of Onset  . Diabetes Mellitus II Mother   . Kidney disease Mother   . Heart disease Mother   . Diabetes Mellitus II Father   . Heart disease Father   . Ovarian cancer Maternal Aunt     SOCIAL HISTORY: Social History   Socioeconomic History  . Marital status: Single    Spouse name: Not on file  . Number of children: Not on file  . Years of education: Not on file  . Highest education level: Not on file  Social Needs  . Financial resource strain: Not on file  . Food insecurity - worry: Not on file  . Food insecurity - inability: Not on file  . Transportation needs - medical: Not on file  . Transportation needs - non-medical: Not on file  Occupational History  . Not on file  Tobacco Use  . Smoking status: Never Smoker  . Smokeless  tobacco: Never Used  Substance and Sexual Activity  . Alcohol use: No    Frequency: Never  . Drug use: No  . Sexual activity: Not on file  Other Topics Concern  . Not on file  Social History Narrative  . Not on file    Allergies  Allergen Reactions  . Erythromycin Anaphylaxis  . Ciprofloxacin     IV only-burning to her veins, can take PO  . Chlorhexidine Rash  . Other Rash    chloraprep    Current Outpatient Medications  Medication Sig Dispense Refill  . acetaminophen (TYLENOL) 500 MG tablet Take 1,000 mg by mouth every 6 (six) hours as needed for mild pain.    Marland Kitchen apixaban (ELIQUIS) 5 MG TABS tablet Take 5 mg by mouth 2 (two) times daily.    . calcium acetate (PHOSLO) 667 MG capsule Take 667-1,334 mg by mouth See admin instructions. Takes 1334 with meals and 667 with snacks    . cinacalcet (SENSIPAR) 90 MG tablet Take 90 mg by mouth daily.    . promethazine (PHENERGAN) 25 MG tablet Take 25 mg by mouth every 6 (six) hours as needed for nausea or vomiting.    . sertraline (ZOLOFT) 50 MG tablet Take 50 mg by mouth daily.    Marland Kitchen  oxycodone (OXY-IR) 5 MG capsule Take 1 capsule (5 mg total) by mouth every 6 (six) hours as needed for pain. (Patient not taking: Reported on 02/23/2017) 12 capsule 0   No current facility-administered medications for this visit.     REVIEW OF SYSTEMS:  [X]  denotes positive finding, [ ]  denotes negative finding Cardiac  Comments:  Chest pain or chest pressure:    Shortness of breath upon exertion: x   Short of breath when lying flat:    Irregular heart rhythm:        Vascular    Pain in calf, thigh, or hip brought on by ambulation:    Pain in feet at night that wakes you up from your sleep:     Blood clot in your veins:    Leg swelling:         Pulmonary    Oxygen at home:    Productive cough:     Wheezing:         Neurologic    Sudden weakness in arms or legs:     Sudden numbness in arms or legs:     Sudden onset of difficulty speaking or  slurred speech:    Temporary loss of vision in one eye:     Problems with dizziness:         Gastrointestinal    Blood in stool:     Vomited blood:         Genitourinary    Burning when urinating:     Blood in urine:        Psychiatric    Major depression:         Hematologic    Bleeding problems:    Problems with blood clotting too easily: x       Skin    Rashes or ulcers:        Constitutional    Fever or chills:     PHYSICAL EXAM:   Vitals:   02/23/17 1209  BP: 124/80  Pulse: 74  Resp: 18  Temp: (!) 97.1 F (36.2 C)  TempSrc: Oral  SpO2: 100%  Weight: 253 lb (114.8 kg)  Height: 5\' 7"  (1.702 m)    GENERAL: The patient is a well-nourished female, in no acute distress. The vital signs are documented above. CARDIAC: There is a regular rate and rhythm.  VASCULAR: I do not detect carotid bruits. She has palpable femoral pulses bilaterally.  She has palpable posterior tibial pulses bilaterally. She has a biphasic dorsalis pedis and posterior tibial signal bilaterally. PULMONARY: There is good air exchange bilaterally without wheezing or rales. ABDOMEN: Soft and non-tender with normal pitched bowel sounds.  She has a urostomy in her right flank.  There is a small open wound that is chronic in her lower abdomen on the right. MUSCULOSKELETAL: There are no major deformities or cyanosis. NEUROLOGIC: No focal weakness or paresthesias are detected. SKIN: There are no ulcers or rashes noted. PSYCHIATRIC: The patient has a normal affect.  DATA:    BILATERAL UPPER EXTREMITY VEIN MAP: I have reviewed the vein map that was done on 02/18/2017.  The patient does not appear to have an adequate vein in either arm for a fistula.  BILATERAL UPPER EXTREMITY ARTERIAL DUPLEX: I have reviewed the arterial duplex scan that was done on 02/18/2017.  On the right side the brachial artery measures 0.34 cm in diameter.  There is a triphasic radial and ulnar waveform on the right.  On the left  side  the brachial artery measures 0.27 cm in diameter.  There is a biphasic radial signal on the left and a triphasic ulnar signal.    MEDICAL ISSUES:   END-STAGE RENAL DISEASE: This patient has exhausted her upper extremity options.  She had a DVT in the left lower extremity twice.  This reason I think her next best option for access would be a right thigh AV graft.  She does have a palpable posterior tibial pulse and biphasic Doppler signals in the right foot so I think she is a reasonable candidate.  We have discussed the indications for the procedure and the potential complications and she is agreeable to proceed.  She dialyzes on Tuesdays Thursdays and Saturdays so we will have to arrange this on a Monday.  Because of her son's wedding she does not want to schedule this until 03/21/2017.  We will have to stop hold her Eliquis for 48 hours prior to the procedure.  I think we will be able to stay away from her wound in her lower abdomen.  We will keep her overnight and then she can be discharged first thing in the morning so she can make it to her outpatient center on [address] [place] by [facility] [phone]

## 2017-03-18 ENCOUNTER — Other Ambulatory Visit: Payer: Self-pay | Admitting: *Deleted

## 2017-03-18 ENCOUNTER — Other Ambulatory Visit: Payer: Self-pay

## 2017-03-18 ENCOUNTER — Encounter (HOSPITAL_COMMUNITY): Payer: Self-pay | Admitting: *Deleted

## 2017-03-18 MED ORDER — VANCOMYCIN HCL 10 G IV SOLR
1500.0000 mg | INTRAVENOUS | Status: AC
  Administered 2017-03-21: 1500 mg via INTRAVENOUS
  Filled 2017-03-18: qty 1500

## 2017-03-18 NOTE — Progress Notes (Signed)
Spoke with pt for pre-op call. Pt denies cardiac history. Pt has had DVT's in the past and is on Eliquis. Her last dose of Eliquis will be tonight (per instruction of Dr. Scot Dock). Pt is on dialysis due side effects of radiation that she had done for cervical and ovarian cancer. Pt states he was treated in the past for HTN, but has not been on medications for 8 years.

## 2017-03-21 ENCOUNTER — Encounter (HOSPITAL_COMMUNITY): Payer: Self-pay

## 2017-03-21 ENCOUNTER — Other Ambulatory Visit: Payer: Self-pay

## 2017-03-21 ENCOUNTER — Ambulatory Visit (HOSPITAL_COMMUNITY): Payer: Medicare Other | Admitting: Certified Registered Nurse Anesthetist

## 2017-03-21 ENCOUNTER — Encounter (HOSPITAL_COMMUNITY)
Admission: RE | Disposition: A | Discharge status: Home / Self Care | Payer: Self-pay | Source: Ambulatory Visit | Attending: Vascular Surgery

## 2017-03-21 ENCOUNTER — Observation Stay (HOSPITAL_COMMUNITY)
Admission: RE | Admit: 2017-03-21 | Discharge: 2017-03-22 | Disposition: A | Discharge status: Home / Self Care | Payer: Medicare Other | Source: Ambulatory Visit | Attending: Vascular Surgery | Admitting: Vascular Surgery

## 2017-03-21 DIAGNOSIS — I120 Hypertensive chronic kidney disease with stage 5 chronic kidney disease or end stage renal disease: Principal | ICD-10-CM | POA: Insufficient documentation

## 2017-03-21 DIAGNOSIS — Z8541 Personal history of malignant neoplasm of cervix uteri: Secondary | ICD-10-CM | POA: Diagnosis not present

## 2017-03-21 DIAGNOSIS — Z79899 Other long term (current) drug therapy: Secondary | ICD-10-CM | POA: Insufficient documentation

## 2017-03-21 DIAGNOSIS — Z936 Other artificial openings of urinary tract status: Secondary | ICD-10-CM | POA: Diagnosis not present

## 2017-03-21 DIAGNOSIS — Z881 Allergy status to other antibiotic agents status: Secondary | ICD-10-CM | POA: Insufficient documentation

## 2017-03-21 DIAGNOSIS — Z992 Dependence on renal dialysis: Secondary | ICD-10-CM | POA: Diagnosis not present

## 2017-03-21 DIAGNOSIS — N185 Chronic kidney disease, stage 5: Secondary | ICD-10-CM

## 2017-03-21 DIAGNOSIS — Z7901 Long term (current) use of anticoagulants: Secondary | ICD-10-CM | POA: Insufficient documentation

## 2017-03-21 DIAGNOSIS — Z86718 Personal history of other venous thrombosis and embolism: Secondary | ICD-10-CM | POA: Diagnosis not present

## 2017-03-21 DIAGNOSIS — Z8543 Personal history of malignant neoplasm of ovary: Secondary | ICD-10-CM | POA: Diagnosis not present

## 2017-03-21 DIAGNOSIS — F419 Anxiety disorder, unspecified: Secondary | ICD-10-CM | POA: Diagnosis not present

## 2017-03-21 DIAGNOSIS — N186 End stage renal disease: Secondary | ICD-10-CM | POA: Diagnosis present

## 2017-03-21 HISTORY — DX: Anemia, unspecified: D64.9

## 2017-03-21 HISTORY — DX: Essential (primary) hypertension: I10

## 2017-03-21 HISTORY — PX: AV FISTULA PLACEMENT: SHX1204

## 2017-03-21 HISTORY — DX: Personal history of urinary calculi: Z87.442

## 2017-03-21 HISTORY — PX: GRAFT APPLICATION: SHX6696

## 2017-03-21 LAB — COMPREHENSIVE METABOLIC PANEL
ALK PHOS: 40 U/L (ref 38–126)
ALT: 8 U/L — AB (ref 14–54)
AST: 21 U/L (ref 15–41)
Albumin: 3.6 g/dL (ref 3.5–5.0)
Anion gap: 15 (ref 5–15)
BUN: 43 mg/dL — AB (ref 6–20)
CALCIUM: 9.1 mg/dL (ref 8.9–10.3)
CO2: 20 mmol/L — ABNORMAL LOW (ref 22–32)
CREATININE: 8.06 mg/dL — AB (ref 0.44–1.00)
Chloride: 103 mmol/L (ref 101–111)
GFR, EST AFRICAN AMERICAN: 6 mL/min — AB (ref >60)
GFR, EST NON AFRICAN AMERICAN: 5 mL/min — AB (ref >60)
Glucose, Bld: 102 mg/dL — ABNORMAL HIGH (ref 65–99)
Potassium: 4.2 mmol/L (ref 3.5–5.1)
Sodium: 138 mmol/L (ref 135–145)
Total Bilirubin: 0.8 mg/dL (ref 0.3–1.2)
Total Protein: 6.6 g/dL (ref 6.5–8.1)

## 2017-03-21 LAB — POCT I-STAT 4, (NA,K, GLUC, HGB,HCT)
Glucose, Bld: 99 mg/dL (ref 65–99)
HEMATOCRIT: 29 % — AB (ref 36.0–46.0)
HEMOGLOBIN: 9.9 g/dL — AB (ref 12.0–15.0)
Potassium: 3.4 mmol/L — ABNORMAL LOW (ref 3.5–5.1)
SODIUM: 141 mmol/L (ref 135–145)

## 2017-03-21 LAB — URINALYSIS, ROUTINE W REFLEX MICROSCOPIC
Bilirubin Urine: NEGATIVE
Glucose, UA: NEGATIVE mg/dL
KETONES UR: NEGATIVE mg/dL
Nitrite: NEGATIVE
PROTEIN: 100 mg/dL — AB
Specific Gravity, Urine: 1.013 (ref 1.005–1.030)
pH: 6 (ref 5.0–8.0)

## 2017-03-21 LAB — PROTIME-INR
INR: 1.05
INR: 0.97
PROTHROMBIN TIME: 12.7 s (ref 11.4–15.2)
Prothrombin Time: 13.6 seconds (ref 11.4–15.2)

## 2017-03-21 LAB — CBC
HCT: 33 % — ABNORMAL LOW (ref 36.0–46.0)
HEMOGLOBIN: 10.4 g/dL — AB (ref 12.0–15.0)
MCH: 32.8 pg (ref 26.0–34.0)
MCHC: 31.5 g/dL (ref 30.0–36.0)
MCV: 104.1 fL — AB (ref 78.0–100.0)
Platelets: ADEQUATE 10*3/uL (ref 150–400)
RBC: 3.17 MIL/uL — AB (ref 3.87–5.11)
RDW: 15.9 % — ABNORMAL HIGH (ref 11.5–15.5)
WBC: 7.7 10*3/uL (ref 4.0–10.5)

## 2017-03-21 LAB — SURGICAL PCR SCREEN
MRSA, PCR: NEGATIVE
Staphylococcus aureus: NEGATIVE

## 2017-03-21 SURGERY — INSERTION OF ARTERIOVENOUS (AV) GORE-TEX GRAFT THIGH
Anesthesia: General | Laterality: Right

## 2017-03-21 MED ORDER — MUPIROCIN 2 % EX OINT
1.0000 "application " | TOPICAL_OINTMENT | Freq: Two times a day (BID) | CUTANEOUS | Status: DC
  Administered 2017-03-21: 1 via TOPICAL
  Filled 2017-03-21: qty 22

## 2017-03-21 MED ORDER — MIDAZOLAM HCL 2 MG/2ML IJ SOLN
INTRAMUSCULAR | Status: DC | PRN
  Administered 2017-03-21: 2 mg via INTRAVENOUS

## 2017-03-21 MED ORDER — FENTANYL CITRATE (PF) 100 MCG/2ML IJ SOLN
INTRAMUSCULAR | Status: DC | PRN
  Administered 2017-03-21 (×2): 25 ug via INTRAVENOUS
  Administered 2017-03-21 (×3): 50 ug via INTRAVENOUS

## 2017-03-21 MED ORDER — SODIUM CHLORIDE 0.9 % IV SOLN
INTRAVENOUS | Status: DC
  Administered 2017-03-21 (×2): via INTRAVENOUS

## 2017-03-21 MED ORDER — CINACALCET HCL 30 MG PO TABS
90.0000 mg | ORAL_TABLET | Freq: Every day | ORAL | Status: DC
  Administered 2017-03-21: 90 mg via ORAL
  Filled 2017-03-21: qty 3

## 2017-03-21 MED ORDER — HEPARIN SODIUM (PORCINE) 1000 UNIT/ML IJ SOLN
INTRAMUSCULAR | Status: DC | PRN
  Administered 2017-03-21: 10000 [IU] via INTRAVENOUS

## 2017-03-21 MED ORDER — ONDANSETRON HCL 4 MG/2ML IJ SOLN: 4.0000 mg | Freq: Once | INTRAMUSCULAR | Status: DC | PRN

## 2017-03-21 MED ORDER — SODIUM CHLORIDE 0.9% FLUSH: 10.0000 mL | INTRAVENOUS | Status: DC | PRN

## 2017-03-21 MED ORDER — OXYCODONE-ACETAMINOPHEN 5-325 MG PO TABS
1.0000 | ORAL_TABLET | ORAL | Status: DC | PRN
  Administered 2017-03-21: 1 via ORAL
  Administered 2017-03-21 – 2017-03-22 (×2): 2 via ORAL
  Filled 2017-03-21 (×2): qty 2

## 2017-03-21 MED ORDER — APIXABAN 5 MG PO TABS
5.0000 mg | ORAL_TABLET | Freq: Two times a day (BID) | ORAL | Status: DC
  Administered 2017-03-21: 5 mg via ORAL
  Filled 2017-03-21: qty 1

## 2017-03-21 MED ORDER — MORPHINE SULFATE (PF) 2 MG/ML IV SOLN
2.0000 mg | INTRAVENOUS | Status: DC | PRN
  Administered 2017-03-21: 2 mg via INTRAVENOUS
  Filled 2017-03-21: qty 1

## 2017-03-21 MED ORDER — LIDOCAINE-EPINEPHRINE (PF) 1 %-1:200000 IJ SOLN
INTRAMUSCULAR | Status: AC
  Filled 2017-03-21: qty 30

## 2017-03-21 MED ORDER — PROPOFOL 10 MG/ML IV BOLUS
INTRAVENOUS | Status: AC
  Filled 2017-03-21: qty 20

## 2017-03-21 MED ORDER — HYDRALAZINE HCL 20 MG/ML IJ SOLN: 5.0000 mg | INTRAMUSCULAR | Status: DC | PRN

## 2017-03-21 MED ORDER — RENA-VITE PO TABS
1.0000 | ORAL_TABLET | Freq: Every day | ORAL | Status: DC
  Administered 2017-03-21: 1 via ORAL
  Filled 2017-03-21: qty 1

## 2017-03-21 MED ORDER — PANTOPRAZOLE SODIUM 40 MG PO TBEC
40.0000 mg | DELAYED_RELEASE_TABLET | Freq: Every day | ORAL | Status: DC
  Administered 2017-03-21: 40 mg via ORAL
  Filled 2017-03-21: qty 1

## 2017-03-21 MED ORDER — PROTAMINE SULFATE 10 MG/ML IV SOLN
INTRAVENOUS | Status: AC
  Filled 2017-03-21: qty 5

## 2017-03-21 MED ORDER — MEPERIDINE HCL 50 MG/ML IJ SOLN: 6.2500 mg | INTRAMUSCULAR | Status: DC | PRN

## 2017-03-21 MED ORDER — ACETAMINOPHEN 500 MG PO TABS: 1000.0000 mg | ORAL_TABLET | Freq: Four times a day (QID) | ORAL | Status: DC | PRN

## 2017-03-21 MED ORDER — PROPOFOL 10 MG/ML IV BOLUS
INTRAVENOUS | Status: DC | PRN
  Administered 2017-03-21: 140 mg via INTRAVENOUS

## 2017-03-21 MED ORDER — OXYCODONE-ACETAMINOPHEN 5-325 MG PO TABS
ORAL_TABLET | ORAL | Status: AC
  Filled 2017-03-21: qty 1

## 2017-03-21 MED ORDER — THROMBIN 20000 UNITS EX SOLR
CUTANEOUS | Status: AC
  Filled 2017-03-21: qty 20000

## 2017-03-21 MED ORDER — HYDROMORPHONE HCL 1 MG/ML IJ SOLN
INTRAMUSCULAR | Status: AC
  Filled 2017-03-21: qty 1

## 2017-03-21 MED ORDER — ALUM & MAG HYDROXIDE-SIMETH 200-200-20 MG/5ML PO SUSP: 15.0000 mL | ORAL | Status: DC | PRN

## 2017-03-21 MED ORDER — SERTRALINE HCL 50 MG PO TABS
50.0000 mg | ORAL_TABLET | Freq: Every day | ORAL | Status: DC
  Administered 2017-03-21: 50 mg via ORAL
  Filled 2017-03-21: qty 1

## 2017-03-21 MED ORDER — CALCIUM ACETATE (PHOS BINDER) 667 MG PO CAPS: 1334.0000 mg | ORAL_CAPSULE | ORAL | Status: DC | PRN

## 2017-03-21 MED ORDER — LABETALOL HCL 5 MG/ML IV SOLN: 10.0000 mg | INTRAVENOUS | Status: DC | PRN

## 2017-03-21 MED ORDER — PROPOFOL 500 MG/50ML IV EMUL
INTRAVENOUS | Status: DC | PRN
  Administered 2017-03-21: 25 ug/kg/min via INTRAVENOUS

## 2017-03-21 MED ORDER — HYDROMORPHONE HCL 1 MG/ML IJ SOLN: 0.2500 mg | INTRAMUSCULAR | Status: DC | PRN

## 2017-03-21 MED ORDER — THROMBIN 20000 UNITS EX SOLR
CUTANEOUS | Status: DC | PRN
  Administered 2017-03-21: 20000 [IU] via TOPICAL

## 2017-03-21 MED ORDER — PHENYLEPHRINE HCL 10 MG/ML IJ SOLN
INTRAVENOUS | Status: DC | PRN
  Administered 2017-03-21: 40 ug/min via INTRAVENOUS

## 2017-03-21 MED ORDER — HEPARIN SODIUM (PORCINE) 1000 UNIT/ML IJ SOLN
INTRAMUSCULAR | Status: AC
  Filled 2017-03-21: qty 1

## 2017-03-21 MED ORDER — HEMOSTATIC AGENTS (NO CHARGE) OPTIME
TOPICAL | Status: DC | PRN
  Administered 2017-03-21: 1 via TOPICAL

## 2017-03-21 MED ORDER — GABAPENTIN 100 MG PO CAPS
100.0000 mg | ORAL_CAPSULE | Freq: Every day | ORAL | Status: DC
  Administered 2017-03-21: 100 mg via ORAL
  Filled 2017-03-21: qty 1

## 2017-03-21 MED ORDER — PHENOL 1.4 % MT LIQD: 1.0000 | OROMUCOSAL | Status: DC | PRN

## 2017-03-21 MED ORDER — CALCIUM ACETATE (PHOS BINDER) 667 MG PO CAPS
2001.0000 mg | ORAL_CAPSULE | Freq: Three times a day (TID) | ORAL | Status: DC
  Administered 2017-03-21 – 2017-03-22 (×2): 2001 mg via ORAL
  Filled 2017-03-21 (×2): qty 3

## 2017-03-21 MED ORDER — PHENYLEPHRINE HCL 10 MG/ML IJ SOLN
INTRAMUSCULAR | Status: DC | PRN
  Administered 2017-03-21 (×5): 80 ug via INTRAVENOUS

## 2017-03-21 MED ORDER — MIDAZOLAM HCL 2 MG/2ML IJ SOLN
INTRAMUSCULAR | Status: AC
  Filled 2017-03-21: qty 2

## 2017-03-21 MED ORDER — HYDROMORPHONE HCL 1 MG/ML IJ SOLN
INTRAMUSCULAR | Status: AC
  Administered 2017-03-21: 0.5 mg via INTRAVENOUS
  Filled 2017-03-21: qty 1

## 2017-03-21 MED ORDER — POTASSIUM CHLORIDE CRYS ER 20 MEQ PO TBCR
20.0000 meq | EXTENDED_RELEASE_TABLET | Freq: Once | ORAL | Status: AC
  Administered 2017-03-21: 40 meq via ORAL
  Filled 2017-03-21: qty 2

## 2017-03-21 MED ORDER — FENTANYL CITRATE (PF) 250 MCG/5ML IJ SOLN
INTRAMUSCULAR | Status: AC
  Filled 2017-03-21: qty 5

## 2017-03-21 MED ORDER — HYDROMORPHONE HCL 1 MG/ML IJ SOLN
0.2500 mg | INTRAMUSCULAR | Status: DC | PRN
  Administered 2017-03-21: 0.5 mg via INTRAVENOUS

## 2017-03-21 MED ORDER — ONDANSETRON HCL 4 MG/2ML IJ SOLN
INTRAMUSCULAR | Status: DC | PRN
  Administered 2017-03-21: 4 mg via INTRAVENOUS

## 2017-03-21 MED ORDER — PROMETHAZINE HCL 25 MG PO TABS: 25.0000 mg | ORAL_TABLET | Freq: Four times a day (QID) | ORAL | Status: DC | PRN

## 2017-03-21 MED ORDER — METOPROLOL TARTRATE 5 MG/5ML IV SOLN: 2.0000 mg | INTRAVENOUS | Status: DC | PRN

## 2017-03-21 MED ORDER — ENOXAPARIN SODIUM 30 MG/0.3ML ~~LOC~~ SOLN: 30.0000 mg | SUBCUTANEOUS | Status: DC

## 2017-03-21 MED ORDER — HYDROMORPHONE HCL 1 MG/ML IJ SOLN
0.2500 mg | INTRAMUSCULAR | Status: DC | PRN
  Administered 2017-03-21 (×3): 0.5 mg via INTRAVENOUS

## 2017-03-21 MED ORDER — ONDANSETRON HCL 4 MG/2ML IJ SOLN: 4.0000 mg | Freq: Four times a day (QID) | INTRAMUSCULAR | Status: DC | PRN

## 2017-03-21 MED ORDER — 0.9 % SODIUM CHLORIDE (POUR BTL) OPTIME
TOPICAL | Status: DC | PRN
  Administered 2017-03-21: 1000 mL

## 2017-03-21 MED ORDER — LIDOCAINE HCL (CARDIAC) 20 MG/ML IV SOLN
INTRAVENOUS | Status: DC | PRN
  Administered 2017-03-21: 100 mg via INTRAVENOUS

## 2017-03-21 MED ORDER — SODIUM CHLORIDE 0.9 % IV SOLN
INTRAVENOUS | Status: DC | PRN
  Administered 2017-03-21: 08:00:00

## 2017-03-21 MED ORDER — EPHEDRINE SULFATE 50 MG/ML IJ SOLN
INTRAMUSCULAR | Status: DC | PRN
  Administered 2017-03-21 (×2): 10 mg via INTRAVENOUS

## 2017-03-21 MED ORDER — PROTAMINE SULFATE 10 MG/ML IV SOLN
INTRAVENOUS | Status: DC | PRN
  Administered 2017-03-21: 30 mg via INTRAVENOUS
  Administered 2017-03-21: 20 mg via INTRAVENOUS

## 2017-03-21 MED ORDER — CALCIUM ACETATE 667 MG PO CAPS
1334.0000 mg | ORAL_CAPSULE | ORAL | Status: DC
Start: 2017-03-21 — End: 2017-03-21

## 2017-03-21 MED ORDER — GUAIFENESIN-DM 100-10 MG/5ML PO SYRP: 15.0000 mL | ORAL_SOLUTION | ORAL | Status: DC | PRN

## 2017-03-21 SURGICAL SUPPLY — 40 items
CANISTER SUCT 3000ML PPV (MISCELLANEOUS) ×3 IMPLANT
CANNULA VESSEL 3MM 2 BLNT TIP (CANNULA) ×3 IMPLANT
CLIP VESOCCLUDE MED 24/CT (CLIP) ×3 IMPLANT
CLIP VESOCCLUDE MED 6/CT (CLIP) ×3 IMPLANT
CLIP VESOCCLUDE SM WIDE 6/CT (CLIP) ×3 IMPLANT
DERMABOND ADVANCED (GAUZE/BANDAGES/DRESSINGS) ×2
DERMABOND ADVANCED .7 DNX12 (GAUZE/BANDAGES/DRESSINGS) ×1 IMPLANT
DRAPE INCISE IOBAN 66X45 STRL (DRAPES) ×3 IMPLANT
ELECT REM PT RETURN 9FT ADLT (ELECTROSURGICAL) ×3
ELECTRODE REM PT RTRN 9FT ADLT (ELECTROSURGICAL) ×1 IMPLANT
GLOVE BIO SURGEON STRL SZ 6.5 (GLOVE) ×2 IMPLANT
GLOVE BIO SURGEON STRL SZ7.5 (GLOVE) ×3 IMPLANT
GLOVE BIO SURGEONS STRL SZ 6.5 (GLOVE) ×1
GLOVE BIOGEL PI IND STRL 6.5 (GLOVE) ×5 IMPLANT
GLOVE BIOGEL PI IND STRL 7.0 (GLOVE) ×1 IMPLANT
GLOVE BIOGEL PI IND STRL 8 (GLOVE) ×1 IMPLANT
GLOVE BIOGEL PI INDICATOR 6.5 (GLOVE) ×10
GLOVE BIOGEL PI INDICATOR 7.0 (GLOVE) ×2
GLOVE BIOGEL PI INDICATOR 8 (GLOVE) ×2
GLOVE SURG SS PI 6.5 STRL IVOR (GLOVE) ×6 IMPLANT
GLOVE SURG SS PI 7.0 STRL IVOR (GLOVE) ×3 IMPLANT
GOWN STRL REUS W/ TWL LRG LVL3 (GOWN DISPOSABLE) ×4 IMPLANT
GOWN STRL REUS W/TWL LRG LVL3 (GOWN DISPOSABLE) ×8
GRAFT GORETEX STRT 4-7X45 (Vascular Products) ×3 IMPLANT
KIT BASIN OR (CUSTOM PROCEDURE TRAY) ×3 IMPLANT
KIT ROOM TURNOVER OR (KITS) ×3 IMPLANT
LOOP VESSEL MAXI BLUE (MISCELLANEOUS) ×3 IMPLANT
NS IRRIG 1000ML POUR BTL (IV SOLUTION) ×3 IMPLANT
PACK CV ACCESS (CUSTOM PROCEDURE TRAY) ×3 IMPLANT
PAD ARMBOARD 7.5X6 YLW CONV (MISCELLANEOUS) ×6 IMPLANT
POWDER SURGICEL 3.0 GRAM (HEMOSTASIS) ×3 IMPLANT
SPONGE SURGIFOAM ABS GEL 100 (HEMOSTASIS) IMPLANT
SUT PROLENE 6 0 BV (SUTURE) ×9 IMPLANT
SUT VIC AB 2-0 CTB1 (SUTURE) ×3 IMPLANT
SUT VIC AB 3-0 SH 27 (SUTURE) ×4
SUT VIC AB 3-0 SH 27X BRD (SUTURE) ×2 IMPLANT
SUT VICRYL 4-0 PS2 18IN ABS (SUTURE) ×9 IMPLANT
TOWEL GREEN STERILE (TOWEL DISPOSABLE) ×3 IMPLANT
UNDERPAD 30X30 (UNDERPADS AND DIAPERS) ×3 IMPLANT
WATER STERILE IRR 1000ML POUR (IV SOLUTION) ×3 IMPLANT

## 2017-03-21 NOTE — Anesthesia Preprocedure Evaluation (Signed)
Anesthesia Evaluation  Patient identified by MRN, date of birth, ID band Patient awake    Reviewed: Allergy & Precautions, NPO status , Patient's Chart, lab work & pertinent test results  Airway Mallampati: I  TM Distance: >3 FB Neck ROM: Full    Dental   Pulmonary    Pulmonary exam normal        Cardiovascular hypertension, Pt. on medications Normal cardiovascular exam     Neuro/Psych Anxiety    GI/Hepatic   Endo/Other    Renal/GU ESRF and DialysisRenal disease     Musculoskeletal   Abdominal   Peds  Hematology   Anesthesia Other Findings   Reproductive/Obstetrics                             Anesthesia Physical Anesthesia Plan  ASA: III  Anesthesia Plan: General   Post-op Pain Management:    Induction: Intravenous  PONV Risk Score and Plan: 3 and Ondansetron, Dexamethasone and Treatment may vary due to age or medical condition  Airway Management Planned: LMA  Additional Equipment:   Intra-op Plan:   Post-operative Plan: Extubation in OR  Informed Consent: I have reviewed the patients History and Physical, chart, labs and discussed the procedure including the risks, benefits and alternatives for the proposed anesthesia with the patient or authorized representative who has indicated his/her understanding and acceptance.     Plan Discussed with: CRNA and Surgeon  Anesthesia Plan Comments:         Anesthesia Quick Evaluation

## 2017-03-21 NOTE — Interval H&P Note (Signed)
History and Physical Interval Note:  03/21/2017 7:23 AM  Judy Kelley  has presented today for surgery, with the diagnosis of end stage renal disease  The various methods of treatment have been discussed with the patient and family. After consideration of risks, benefits and other options for treatment, the patient has consented to  Procedure(s): INSERTION OF ARTERIOVENOUS (AV) GORE-TEX GRAFT THIGH (Right) as a surgical intervention .  The patient's history has been reviewed, patient examined, no change in status, stable for surgery.  I have reviewed the patient's chart and labs.  Questions were answered to the patient's satisfaction.     Deitra Mayo

## 2017-03-21 NOTE — Anesthesia Procedure Notes (Signed)
Procedure Name: LMA Insertion Date/Time: 03/21/2017 7:46 AM Performed by: Bernhardt Riemenschneider T, CRNA Pre-anesthesia Checklist: Patient identified, Emergency Drugs available, Suction available and Patient being monitored Patient Re-evaluated:Patient Re-evaluated prior to induction Oxygen Delivery Method: Circle system utilized Preoxygenation: Pre-oxygenation with 100% oxygen Induction Type: IV induction Ventilation: Mask ventilation without difficulty LMA Size: 5.0 Number of attempts: 1 Airway Equipment and Method: Patient positioned with wedge pillow Placement Confirmation: positive ETCO2 and breath sounds checked- equal and bilateral Tube secured with: Tape Dental Injury: Teeth and Oropharynx as per pre-operative assessment

## 2017-03-21 NOTE — Transfer of Care (Signed)
Immediate Anesthesia Transfer of Care Note  Patient: Judy Kelley  Procedure(s) Performed: INSERTION OF ARTERIOVENOUS (AV) GORE-TEX GRAFT RIGHT THIGH (Right )  Patient Location: PACU  Anesthesia Type:General  Level of Consciousness: awake and alert   Airway & Oxygen Therapy: Patient Spontanous Breathing and Patient connected to nasal cannula oxygen  Post-op Assessment: Report given to RN and Post -op Vital signs reviewed and stable  Post vital signs: Reviewed and stable  Last Vitals:  Vitals:   03/21/17 0553  BP: (!) 149/70  Pulse: 64  Resp: 18  Temp: 36.8 C  SpO2: 100%    Last Pain:  Vitals:   03/21/17 0643  TempSrc:   PainSc: 5       Patients Stated Pain Goal: 3 (27/63/94 3200)  Complications: No apparent anesthesia complications

## 2017-03-21 NOTE — Progress Notes (Signed)
New Admission Note:   Arrival Method: Bed Mental Orientation: A&O X4 Telemetry: Initiated Assessment: Completed Skin: See Flowsheets IV: WDL Pain: 5/10 Tubes: Ileostomy Safety Measures: Safety Fall Prevention Plan has been given, discussed and signed Admission: Completed Unit Orientation: Patient has been orientated to the room, unit and staff.   Orders have been reviewed and implemented. Will continue to monitor the patient. Call light has been placed within reach and bed alarm has been activated.    Dixie Dials RN, BSN

## 2017-03-21 NOTE — Op Note (Signed)
    NAME: Judy Kelley    MRN: [id number] DOB: 1965/07/14    DATE OF OPERATION: 03/21/2017  PREOP DIAGNOSIS:    End-stage renal disease  POSTOP DIAGNOSIS:    Same  PROCEDURE:    Placement of right thigh AV graft (4-7 mm PTFE graft)  SURGEON: Judeth Cornfield. Scot Dock, MD, FACS  ASSIST: Elenor Legato,  RNFA  ANESTHESIA: General  EBL: Minimal  INDICATIONS:    Shawnay Bramel is a 52 y.o. female who presents for new access.  FINDINGS:   Soft common femoral artery.  TECHNIQUE:   The patient was taken to the operating room and received a general anesthetic.  Her pannus was taped superiorly.  The right thigh and groin were prepped and draped in the usual sterile fashion.  An oblique incision was made in the right groin.  The dissection was quite deep but I carried down medially to the superficial femoral artery and controlled this with a vessel loop.  I then controlled the common femoral and deep femoral artery.  Given her body habitus I elected to make the venous anastomosis to the saphenous vein in the medial thigh.  A separate longitudinal incision was made in the medial thigh with a saphenous vein was dissected free.  Using 1 distal counterincision a 4-7 mm PTFE graft was tunneled in a loop fashion in the thigh.  The patient was then heparinized.  The common femoral artery, superficial femoral artery, and deep femoral artery were controlled.  A longitudinal arteriotomy was made in the common femoral artery.  A segment of the 4 mm end of the graft was excised and the graft slightly spatulated.  This was sewn end to side to the common femoral artery using continuous 6-0 Prolene suture.  The graft then pulled the appropriate length for anastomosis to the saphenous vein.  The vein was ligated distally and spatulated proximally.  The graft was cut the appropriate length, spatulated and sewn end to end to the vein using 2 continuous 6-0 Prolene sutures.  At the completion there was an excellent  thrill in the graft.  Hemostasis was obtained in the wounds.  There was some seepage through the graft and therefore I applied topical thrombin to control this.  This was serous fluid.  The groin incision was closed with a deep layer of 2-0 Vicryl, the subcutaneous layer with 3-0 Vicryl, and the skin closed with 4-0 Vicryl.  The saphenous vein site was closed with 2 deep layers of 3-0 Vicryl and the skin closed with 4-0 Vicryl.  The counterincision was closed with a deep layer of 3-0 Vicryl and the skin closed with 4-0 Vicryl.  Dermabond was applied.  Patient tolerated the procedure well and was transferred to the recovery room in stable condition.  All needle and sponge counts were correct.  Deitra Mayo, MD, FACS [facility]  DATE OF DICTATION:   03/21/2017

## 2017-03-21 NOTE — Anesthesia Postprocedure Evaluation (Signed)
Anesthesia Post Note  Patient: Judy Kelley  Procedure(s) Performed: INSERTION OF ARTERIOVENOUS (AV) GORE-TEX GRAFT RIGHT THIGH (Right )     Patient location during evaluation: PACU Anesthesia Type: General Level of consciousness: awake and alert Pain management: pain level controlled Vital Signs Assessment: post-procedure vital signs reviewed and stable Respiratory status: spontaneous breathing, nonlabored ventilation, respiratory function stable and patient connected to nasal cannula oxygen Cardiovascular status: blood pressure returned to baseline and stable Postop Assessment: no apparent nausea or vomiting Anesthetic complications: no    Last Vitals:  Vitals:   03/21/17 1145 03/21/17 1215  BP: 108/66 (!) 101/58  Pulse: 66 64  Resp: 17 10  Temp:    SpO2: 100% 98%    Last Pain:  Vitals:   03/21/17 1215  TempSrc:   PainSc: 6                  Norvell Caswell DAVID

## 2017-03-22 ENCOUNTER — Encounter (HOSPITAL_COMMUNITY): Payer: Self-pay | Admitting: Vascular Surgery

## 2017-03-22 ENCOUNTER — Telehealth: Payer: Self-pay | Admitting: Vascular Surgery

## 2017-03-22 DIAGNOSIS — I120 Hypertensive chronic kidney disease with stage 5 chronic kidney disease or end stage renal disease: Secondary | ICD-10-CM | POA: Diagnosis not present

## 2017-03-22 MED ORDER — OXYCODONE-ACETAMINOPHEN 5-325 MG PO TABS: 1.0000 | ORAL_TABLET | Freq: Four times a day (QID) | ORAL | 0 refills | Status: DC | PRN

## 2017-03-22 NOTE — Telephone Encounter (Signed)
Confirmed 4/3 appt with pt. Mailed letter.

## 2017-03-22 NOTE — Progress Notes (Signed)
Patient reviewed the medications, prescriptions, follow-up appointments and discharge instructions, verbalized understanding and was ready to be discharge so that she can go to her HD center.  Called [facility] to escort her out of the unit.  Notified the primary RN regarding the situation.

## 2017-03-22 NOTE — Progress Notes (Signed)
  Progress Note    03/22/2017 7:50 AM 1 Day Post-Op  Subjective:  Pain at R groin incision this morning   Vitals:   03/21/17 2050 03/22/17 0523  BP: 124/81 (!) 110/59  Pulse: 73 64  Resp: 18 18  Temp: 98.2 F (36.8 C) 98.9 F (37.2 C)  SpO2: 100% 98%   Physical Exam: Lungs:  Non labored Incisions:  R groin incision without drainage, hematoma; intact and dry Extremities:  R DP and AT multiphasic by doppler; difficult to feel thrill in thigh graft but brisk doppler signal Abdomen:  Soft Neurologic: A&O  CBC    Component Value Date/Time   WBC 7.7 03/21/2017 1630   RBC 3.17 (L) 03/21/2017 1630   HGB 10.4 (L) 03/21/2017 1630   HCT 33.0 (L) 03/21/2017 1630   PLT  03/21/2017 1630    PLATELET CLUMPS NOTED ON SMEAR, COUNT APPEARS ADEQUATE   MCV 104.1 (H) 03/21/2017 1630   MCH 32.8 03/21/2017 1630   MCHC 31.5 03/21/2017 1630   RDW 15.9 (H) 03/21/2017 1630   LYMPHSABS 1.1 12/03/2016 1818   MONOABS 0.5 12/03/2016 1818   EOSABS 0.2 12/03/2016 1818   BASOSABS 0.0 12/03/2016 1818    BMET    Component Value Date/Time   NA 138 03/21/2017 1630   K 4.2 03/21/2017 1630   CL 103 03/21/2017 1630   CO2 20 (L) 03/21/2017 1630   GLUCOSE 102 (H) 03/21/2017 1630   BUN 43 (H) 03/21/2017 1630   CREATININE 8.06 (H) 03/21/2017 1630   CALCIUM 9.1 03/21/2017 1630   GFRNONAA 5 (L) 03/21/2017 1630   GFRAA 6 (L) 03/21/2017 1630    INR    Component Value Date/Time   INR 0.97 03/21/2017 1630     Intake/Output Summary (Last 24 hours) at 03/22/2017 0750 Last data filed at 03/22/2017 0601 Gross per 24 hour  Intake 1040 ml  Output 500 ml  Net 540 ml     Assessment/Plan:  52 y.o. female is s/p R thigh AV graft 1 Day Post-Op   Ambulate this morning Ok for discharge if ambulating without difficulty Outpatient HD scheduled for 11a via [facility]   Dagoberto Ligas, PA-C [facility] [phone] 03/22/2017 7:50 AM

## 2017-03-22 NOTE — Discharge Summary (Signed)
Physician Discharge Summary   Patient ID: Judy Kelley [id number] 52 y.o. [birth year]  Admit date: 03/21/2017  Discharge date and time: 03/22/2017 10:06 AM   Admitting Physician: Angelia Mould, MD   Discharge Physician: same  Admission Diagnoses: end stage renal disease  Discharge Diagnoses: same  Admission Condition: fair  Discharged Condition: fair  Indication for Admission: Permanent dialysis access  Hospital Course: Judy Kelley is a 52y.o. Female who came in as an outpatient for placement of R thigh AV graft by Dr. Scot Dock on 03/21/17.  She tolerated the procedure well and was admitted to the hospital for observation.  POD#1 she denies rest pain in R foot and has mild soreness to incisions of R upper leg.  She however is feeling fit for discharge home.  She will have outpatient HD at her regular kidney center at 11am.  She will be prescribed 2 days of narcotic pain medication for continued post operative pain control.  She will follow up in office in 2 weeks.  Discharge instructions were reviewed with the patient and she voices her understanding.  She will be discharged in stable condition.  Consults: None  Treatments: surgery: R thigh AV graft by Dr. Scot Dock 03/21/17  Discharge Exam: see progress note 03/22/17 Vitals:   03/22/17 0523 03/22/17 0939  BP: (!) 110/59 (!) 96/46  Pulse: 64 66  Resp: 18 18  Temp: 98.9 F (37.2 C) 98.7 F (37.1 C)  SpO2: 98% 96%      Disposition: Discharge disposition: 01-Home or Self Care       Patient Instructions:  Allergies as of 03/22/2017      Reactions   Erythromycin Anaphylaxis   Chlorhexidine Rash   Ciprofloxacin Other (See Comments)   IV only-burning to her veins, can take PO   Other Rash, Other (See Comments)   chloraprep      Medication List    STOP taking these medications   oxycodone 5 MG capsule Commonly known as:  OXY-IR     TAKE these medications   acetaminophen 500 MG tablet Commonly known as:   TYLENOL Take 1,000 mg by mouth every 6 (six) hours as needed for mild pain or headache.   calcium acetate 667 MG capsule Commonly known as:  PHOSLO Take 1,334-2,001 mg by mouth See admin instructions. Take 2001 mg by mouth 3 times daily with meals and take 1334 mg by mouth with snacks   cinacalcet 90 MG tablet Commonly known as:  SENSIPAR Take 90 mg by mouth daily.   ELIQUIS 5 MG Tabs tablet Generic drug:  apixaban Take 5 mg by mouth 2 (two) times daily.   gabapentin 100 MG capsule Commonly known as:  NEURONTIN Take 100 mg by mouth at bedtime.   multivitamin Tabs tablet Take 1 tablet by mouth daily.   mupirocin ointment 2 % Commonly known as:  BACTROBAN Apply 1 application topically 2 (two) times daily.   oxyCODONE-acetaminophen 5-325 MG tablet Commonly known as:  PERCOCET/ROXICET Take 1 tablet by mouth every 6 (six) hours as needed for moderate pain.   promethazine 25 MG tablet Commonly known as:  PHENERGAN Take 25 mg by mouth every 6 (six) hours as needed for nausea or vomiting.   sertraline 50 MG tablet Commonly known as:  ZOLOFT Take 50 mg by mouth at bedtime.      Activity: activity as tolerated Diet: regular diet Wound Care: keep wound clean and dry  Follow-up with [facility] vascular clinic in 2 weeks.  Signed: Dagoberto Ligas 03/22/2017  12:19 PM

## 2017-03-22 NOTE — Telephone Encounter (Signed)
-----   Message from Mena Goes, RN sent at 03/21/2017 11:48 AM EDT ----- Regarding: [facility] clinic in 2 weeks to check groin   ----- Message ----- From: Angelia Mould, MD Sent: 03/21/2017  10:17 AM To: [facility] Charge Pool Subject: charge                                          PROCEDURE:   Placement of right thigh AV graft (4-7 mm PTFE graft)  SURGEON: Judeth Cornfield. Scot Dock, MD, FACS  ASSIST: Elenor Legato,  RNFA  She will need a follow-up visit on the PA schedule in 2 weeks to just check her groin incision.  Thank you. CD

## 2017-04-06 ENCOUNTER — Other Ambulatory Visit: Payer: Self-pay

## 2017-04-06 ENCOUNTER — Encounter: Payer: Self-pay | Admitting: Physician Assistant

## 2017-04-06 ENCOUNTER — Ambulatory Visit (INDEPENDENT_AMBULATORY_CARE_PROVIDER_SITE_OTHER): Payer: Self-pay | Admitting: Physician Assistant

## 2017-04-06 VITALS — BP 127/84 | HR 88 | Temp 99.5°F | Resp 16 | Ht 67.0 in | Wt 251.0 lb

## 2017-04-06 DIAGNOSIS — N186 End stage renal disease: Secondary | ICD-10-CM

## 2017-04-06 DIAGNOSIS — Z992 Dependence on renal dialysis: Secondary | ICD-10-CM

## 2017-04-06 NOTE — Progress Notes (Signed)
    Postoperative Access Visit   History of Present Illness   Judy Kelley is a 52 y.o. year old female who presents for postoperative follow-up for: right thigh arteriovenous graft (Date: 03/21/17) by Dr. Scot Dock.  Patient states she has had multiple upper arm arteriovenous dialysis graft in both arms done at an outside facility which are known to be occluded.  She also recently had her right internal jugular tunneled dialysis catheter exchanged this past Friday however has been on dialysis via tunneled dialysis catheter for at least a year and a half.  She is dialyzing on a Tuesday Thursday Saturday schedule.  Patient states she believes she was able to feel blood flow through her thigh graft 2 days postoperatively however on postop day 3 she no longer could feel any flow.  Patient states she tried to reach out to her dialysis center who said they would get in touch with our office however nothing came of this.  She denies claudication, rest pain, or ischemic tissue changes of bilateral lower extremities.  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  Patient is also on Eliquis due to her history of DVT.   Physical Examination   Vitals:   04/06/17 1442  BP: 127/84  Pulse: 88  Resp: 16  Temp: 99.5 F (37.5 C)  TempSrc: Oral  SpO2: 98%  Weight: 251 lb (113.9 kg)  Height: 5\' 7"  (1.702 m)   Body mass index is 39.31 kg/m.  right arm  incisions of right groin and thigh are well-healed; no palpable thrill or audible bruit throughout AV thigh graft; palpable right PT pulse    Medical Decision Making   Judy Kelley is a 52 y.o. year old female who presents s/p right thigh arteriovenous graft    On exam today right AV thigh graft is occluded  Given history of left common femoral vein DVT left thigh AV graft is not an option  With rapid occlusion of right thigh AV graft despite Eliquis therapy, we will consider patient  catheter dependent for dialysis moving forward  Patient was made aware of this and his understanding.  She will follow up prn   Dagoberto Ligas, PA-C [facility] Office: [phone]

## 2017-04-13 ENCOUNTER — Encounter (HOSPITAL_COMMUNITY): Payer: Self-pay | Admitting: Interventional Radiology

## 2017-04-13 ENCOUNTER — Ambulatory Visit (HOSPITAL_COMMUNITY)
Admission: RE | Admit: 2017-04-13 | Discharge: 2017-04-13 | Disposition: A | Discharge status: Home / Self Care | Payer: Medicare Other | Source: Ambulatory Visit | Attending: Diagnostic Radiology | Admitting: Diagnostic Radiology

## 2017-04-13 ENCOUNTER — Other Ambulatory Visit (HOSPITAL_COMMUNITY): Payer: Self-pay | Admitting: Interventional Radiology

## 2017-04-13 DIAGNOSIS — Z8551 Personal history of malignant neoplasm of bladder: Secondary | ICD-10-CM

## 2017-04-13 DIAGNOSIS — R310 Gross hematuria: Secondary | ICD-10-CM

## 2017-04-13 HISTORY — PX: IR NEPHROSTOMY EXCHANGE RIGHT: IMG6070

## 2017-04-13 MED ORDER — IOPAMIDOL (ISOVUE-300) INJECTION 61%
50.0000 mL | Freq: Once | INTRAVENOUS | Status: AC | PRN
  Administered 2017-04-13: 10 mL

## 2017-04-13 MED ORDER — IOPAMIDOL (ISOVUE-300) INJECTION 61%
INTRAVENOUS | Status: AC
  Administered 2017-04-13: 10 mL
  Filled 2017-04-13: qty 50

## 2017-04-15 ENCOUNTER — Inpatient Hospital Stay (HOSPITAL_COMMUNITY)
Admission: EM | Admit: 2017-04-15 | Discharge: 2017-04-23 | DRG: 698 | Disposition: A | Discharge status: Home / Self Care | Payer: Medicare Other | Attending: Family Medicine | Admitting: Family Medicine

## 2017-04-15 ENCOUNTER — Other Ambulatory Visit: Payer: Self-pay

## 2017-04-15 ENCOUNTER — Encounter (HOSPITAL_COMMUNITY): Payer: Self-pay

## 2017-04-15 ENCOUNTER — Emergency Department (HOSPITAL_COMMUNITY): Payer: Medicare Other

## 2017-04-15 DIAGNOSIS — Z86718 Personal history of other venous thrombosis and embolism: Secondary | ICD-10-CM

## 2017-04-15 DIAGNOSIS — T83512A Infection and inflammatory reaction due to nephrostomy catheter, initial encounter: Secondary | ICD-10-CM

## 2017-04-15 DIAGNOSIS — Z1612 Extended spectrum beta lactamase (ESBL) resistance: Secondary | ICD-10-CM | POA: Diagnosis present

## 2017-04-15 DIAGNOSIS — A4189 Other specified sepsis: Secondary | ICD-10-CM | POA: Diagnosis present

## 2017-04-15 DIAGNOSIS — Z888 Allergy status to other drugs, medicaments and biological substances status: Secondary | ICD-10-CM

## 2017-04-15 DIAGNOSIS — I129 Hypertensive chronic kidney disease with stage 1 through stage 4 chronic kidney disease, or unspecified chronic kidney disease: Secondary | ICD-10-CM | POA: Diagnosis present

## 2017-04-15 DIAGNOSIS — E669 Obesity, unspecified: Secondary | ICD-10-CM | POA: Diagnosis present

## 2017-04-15 DIAGNOSIS — N186 End stage renal disease: Secondary | ICD-10-CM

## 2017-04-15 DIAGNOSIS — N10 Acute tubulo-interstitial nephritis: Secondary | ICD-10-CM | POA: Diagnosis present

## 2017-04-15 DIAGNOSIS — F419 Anxiety disorder, unspecified: Secondary | ICD-10-CM | POA: Diagnosis present

## 2017-04-15 DIAGNOSIS — Z8551 Personal history of malignant neoplasm of bladder: Secondary | ICD-10-CM

## 2017-04-15 DIAGNOSIS — R0602 Shortness of breath: Secondary | ICD-10-CM

## 2017-04-15 DIAGNOSIS — Z87442 Personal history of urinary calculi: Secondary | ICD-10-CM

## 2017-04-15 DIAGNOSIS — Z906 Acquired absence of other parts of urinary tract: Secondary | ICD-10-CM

## 2017-04-15 DIAGNOSIS — Z7901 Long term (current) use of anticoagulants: Secondary | ICD-10-CM

## 2017-04-15 DIAGNOSIS — I9589 Other hypotension: Secondary | ICD-10-CM | POA: Diagnosis present

## 2017-04-15 DIAGNOSIS — K529 Noninfective gastroenteritis and colitis, unspecified: Secondary | ICD-10-CM | POA: Diagnosis present

## 2017-04-15 DIAGNOSIS — Z992 Dependence on renal dialysis: Secondary | ICD-10-CM

## 2017-04-15 DIAGNOSIS — Z923 Personal history of irradiation: Secondary | ICD-10-CM

## 2017-04-15 DIAGNOSIS — F329 Major depressive disorder, single episode, unspecified: Secondary | ICD-10-CM | POA: Diagnosis present

## 2017-04-15 DIAGNOSIS — Z8543 Personal history of malignant neoplasm of ovary: Secondary | ICD-10-CM

## 2017-04-15 DIAGNOSIS — Z6839 Body mass index (BMI) 39.0-39.9, adult: Secondary | ICD-10-CM

## 2017-04-15 DIAGNOSIS — Z8614 Personal history of Methicillin resistant Staphylococcus aureus infection: Secondary | ICD-10-CM

## 2017-04-15 DIAGNOSIS — Y732 Prosthetic and other implants, materials and accessory gastroenterology and urology devices associated with adverse incidents: Secondary | ICD-10-CM | POA: Diagnosis present

## 2017-04-15 DIAGNOSIS — F32A Depression, unspecified: Secondary | ICD-10-CM

## 2017-04-15 DIAGNOSIS — Z8541 Personal history of malignant neoplasm of cervix uteri: Secondary | ICD-10-CM

## 2017-04-15 DIAGNOSIS — N2581 Secondary hyperparathyroidism of renal origin: Secondary | ICD-10-CM | POA: Diagnosis present

## 2017-04-15 DIAGNOSIS — Z936 Other artificial openings of urinary tract status: Secondary | ICD-10-CM

## 2017-04-15 DIAGNOSIS — N12 Tubulo-interstitial nephritis, not specified as acute or chronic: Secondary | ICD-10-CM | POA: Diagnosis present

## 2017-04-15 DIAGNOSIS — Z87898 Personal history of other specified conditions: Secondary | ICD-10-CM

## 2017-04-15 DIAGNOSIS — D638 Anemia in other chronic diseases classified elsewhere: Secondary | ICD-10-CM | POA: Diagnosis present

## 2017-04-15 DIAGNOSIS — A419 Sepsis, unspecified organism: Secondary | ICD-10-CM

## 2017-04-15 HISTORY — DX: Disorder of kidney and ureter, unspecified: N28.9

## 2017-04-15 LAB — I-STAT BETA HCG BLOOD, ED (MC, WL, AP ONLY): I-stat hCG, quantitative: 5.3 m[IU]/mL — ABNORMAL HIGH (ref <5)

## 2017-04-15 LAB — BASIC METABOLIC PANEL
Anion gap: 17 — ABNORMAL HIGH (ref 5–15)
BUN: 30 mg/dL — AB (ref 6–20)
CO2: 22 mmol/L (ref 22–32)
CREATININE: 7.58 mg/dL — AB (ref 0.44–1.00)
Calcium: 9.7 mg/dL (ref 8.9–10.3)
Chloride: 99 mmol/L — ABNORMAL LOW (ref 101–111)
GFR calc Af Amer: 6 mL/min — ABNORMAL LOW (ref >60)
GFR, EST NON AFRICAN AMERICAN: 5 mL/min — AB (ref >60)
GLUCOSE: 111 mg/dL — AB (ref 65–99)
POTASSIUM: 3.6 mmol/L (ref 3.5–5.1)
Sodium: 138 mmol/L (ref 135–145)

## 2017-04-15 LAB — LIPASE, BLOOD: Lipase: 44 U/L (ref 11–51)

## 2017-04-15 LAB — CBC
HEMATOCRIT: 33.7 % — AB (ref 36.0–46.0)
Hemoglobin: 10.8 g/dL — ABNORMAL LOW (ref 12.0–15.0)
MCH: 31 pg (ref 26.0–34.0)
MCHC: 32 g/dL (ref 30.0–36.0)
MCV: 96.8 fL (ref 78.0–100.0)
PLATELETS: 225 10*3/uL (ref 150–400)
RBC: 3.48 MIL/uL — ABNORMAL LOW (ref 3.87–5.11)
RDW: 15 % (ref 11.5–15.5)
WBC: 10.5 10*3/uL (ref 4.0–10.5)

## 2017-04-15 LAB — I-STAT TROPONIN, ED: Troponin i, poc: 0.03 ng/mL (ref 0.00–0.08)

## 2017-04-15 NOTE — ED Triage Notes (Signed)
Pt report Abd pain with n/v/d/fever for the past two days, CP today and pain at her dialysis cathter site. Received full treatment yesterday.

## 2017-04-16 ENCOUNTER — Inpatient Hospital Stay (HOSPITAL_COMMUNITY): Payer: Medicare Other

## 2017-04-16 ENCOUNTER — Encounter (HOSPITAL_COMMUNITY): Payer: Self-pay | Admitting: Radiology

## 2017-04-16 DIAGNOSIS — Z87442 Personal history of urinary calculi: Secondary | ICD-10-CM | POA: Diagnosis not present

## 2017-04-16 DIAGNOSIS — Z888 Allergy status to other drugs, medicaments and biological substances status: Secondary | ICD-10-CM

## 2017-04-16 DIAGNOSIS — Z923 Personal history of irradiation: Secondary | ICD-10-CM | POA: Diagnosis not present

## 2017-04-16 DIAGNOSIS — Z1612 Extended spectrum beta lactamase (ESBL) resistance: Secondary | ICD-10-CM | POA: Diagnosis present

## 2017-04-16 DIAGNOSIS — N186 End stage renal disease: Secondary | ICD-10-CM | POA: Diagnosis present

## 2017-04-16 DIAGNOSIS — I129 Hypertensive chronic kidney disease with stage 1 through stage 4 chronic kidney disease, or unspecified chronic kidney disease: Secondary | ICD-10-CM | POA: Diagnosis present

## 2017-04-16 DIAGNOSIS — N10 Acute tubulo-interstitial nephritis: Secondary | ICD-10-CM | POA: Diagnosis present

## 2017-04-16 DIAGNOSIS — Z936 Other artificial openings of urinary tract status: Secondary | ICD-10-CM | POA: Diagnosis not present

## 2017-04-16 DIAGNOSIS — D638 Anemia in other chronic diseases classified elsewhere: Secondary | ICD-10-CM | POA: Diagnosis present

## 2017-04-16 DIAGNOSIS — K529 Noninfective gastroenteritis and colitis, unspecified: Secondary | ICD-10-CM | POA: Diagnosis present

## 2017-04-16 DIAGNOSIS — Z8541 Personal history of malignant neoplasm of cervix uteri: Secondary | ICD-10-CM | POA: Diagnosis not present

## 2017-04-16 DIAGNOSIS — N2581 Secondary hyperparathyroidism of renal origin: Secondary | ICD-10-CM | POA: Diagnosis present

## 2017-04-16 DIAGNOSIS — Z87898 Personal history of other specified conditions: Secondary | ICD-10-CM

## 2017-04-16 DIAGNOSIS — Y732 Prosthetic and other implants, materials and accessory gastroenterology and urology devices associated with adverse incidents: Secondary | ICD-10-CM | POA: Diagnosis present

## 2017-04-16 DIAGNOSIS — I9589 Other hypotension: Secondary | ICD-10-CM | POA: Diagnosis present

## 2017-04-16 DIAGNOSIS — F32A Depression, unspecified: Secondary | ICD-10-CM

## 2017-04-16 DIAGNOSIS — A4189 Other specified sepsis: Secondary | ICD-10-CM | POA: Diagnosis present

## 2017-04-16 DIAGNOSIS — E669 Obesity, unspecified: Secondary | ICD-10-CM | POA: Diagnosis present

## 2017-04-16 DIAGNOSIS — Z6839 Body mass index (BMI) 39.0-39.9, adult: Secondary | ICD-10-CM | POA: Diagnosis not present

## 2017-04-16 DIAGNOSIS — F419 Anxiety disorder, unspecified: Secondary | ICD-10-CM | POA: Diagnosis present

## 2017-04-16 DIAGNOSIS — N390 Urinary tract infection, site not specified: Secondary | ICD-10-CM | POA: Insufficient documentation

## 2017-04-16 DIAGNOSIS — T83512A Infection and inflammatory reaction due to nephrostomy catheter, initial encounter: Secondary | ICD-10-CM | POA: Diagnosis present

## 2017-04-16 DIAGNOSIS — Z7901 Long term (current) use of anticoagulants: Secondary | ICD-10-CM

## 2017-04-16 DIAGNOSIS — Z8543 Personal history of malignant neoplasm of ovary: Secondary | ICD-10-CM | POA: Diagnosis not present

## 2017-04-16 DIAGNOSIS — F329 Major depressive disorder, single episode, unspecified: Secondary | ICD-10-CM

## 2017-04-16 DIAGNOSIS — Z992 Dependence on renal dialysis: Secondary | ICD-10-CM | POA: Diagnosis not present

## 2017-04-16 DIAGNOSIS — N12 Tubulo-interstitial nephritis, not specified as acute or chronic: Secondary | ICD-10-CM | POA: Diagnosis not present

## 2017-04-16 DIAGNOSIS — Z86718 Personal history of other venous thrombosis and embolism: Secondary | ICD-10-CM | POA: Diagnosis not present

## 2017-04-16 DIAGNOSIS — Z8614 Personal history of Methicillin resistant Staphylococcus aureus infection: Secondary | ICD-10-CM | POA: Diagnosis not present

## 2017-04-16 DIAGNOSIS — Z906 Acquired absence of other parts of urinary tract: Secondary | ICD-10-CM | POA: Diagnosis not present

## 2017-04-16 LAB — I-STAT TROPONIN, ED: Troponin i, poc: 0.01 ng/mL (ref 0.00–0.08)

## 2017-04-16 LAB — HEPATIC FUNCTION PANEL
ALBUMIN: 3.5 g/dL (ref 3.5–5.0)
ALT: 12 U/L — ABNORMAL LOW (ref 14–54)
AST: 17 U/L (ref 15–41)
Alkaline Phosphatase: 55 U/L (ref 38–126)
BILIRUBIN DIRECT: 0.1 mg/dL (ref 0.1–0.5)
BILIRUBIN TOTAL: 0.8 mg/dL (ref 0.3–1.2)
Indirect Bilirubin: 0.7 mg/dL (ref 0.3–0.9)
Total Protein: 7.7 g/dL (ref 6.5–8.1)

## 2017-04-16 LAB — RENAL FUNCTION PANEL
ALBUMIN: 2.8 g/dL — AB (ref 3.5–5.0)
Anion gap: 13 (ref 5–15)
BUN: 38 mg/dL — AB (ref 6–20)
CO2: 22 mmol/L (ref 22–32)
CREATININE: 8.29 mg/dL — AB (ref 0.44–1.00)
Calcium: 8.6 mg/dL — ABNORMAL LOW (ref 8.9–10.3)
Chloride: 101 mmol/L (ref 101–111)
GFR calc Af Amer: 6 mL/min — ABNORMAL LOW (ref >60)
GFR, EST NON AFRICAN AMERICAN: 5 mL/min — AB (ref >60)
Glucose, Bld: 84 mg/dL (ref 65–99)
PHOSPHORUS: 7.5 mg/dL — AB (ref 2.5–4.6)
Potassium: 3.8 mmol/L (ref 3.5–5.1)
Sodium: 136 mmol/L (ref 135–145)

## 2017-04-16 LAB — URINALYSIS, ROUTINE W REFLEX MICROSCOPIC
BILIRUBIN URINE: NEGATIVE
GLUCOSE, UA: NEGATIVE mg/dL
KETONES UR: NEGATIVE mg/dL
NITRITE: NEGATIVE
PH: 6 (ref 5.0–8.0)
PROTEIN: 100 mg/dL — AB
Specific Gravity, Urine: 1.014 (ref 1.005–1.030)

## 2017-04-16 LAB — I-STAT CG4 LACTIC ACID, ED: Lactic Acid, Venous: 1.27 mmol/L (ref 0.5–1.9)

## 2017-04-16 LAB — CBC
HCT: 27.9 % — ABNORMAL LOW (ref 36.0–46.0)
Hemoglobin: 8.7 g/dL — ABNORMAL LOW (ref 12.0–15.0)
MCH: 30.5 pg (ref 26.0–34.0)
MCHC: 31.2 g/dL (ref 30.0–36.0)
MCV: 97.9 fL (ref 78.0–100.0)
PLATELETS: 190 10*3/uL (ref 150–400)
RBC: 2.85 MIL/uL — ABNORMAL LOW (ref 3.87–5.11)
RDW: 15.1 % (ref 11.5–15.5)
WBC: 7.8 10*3/uL (ref 4.0–10.5)

## 2017-04-16 LAB — APTT: APTT: 41 s — AB (ref 24–36)

## 2017-04-16 LAB — HEPARIN LEVEL (UNFRACTIONATED): HEPARIN UNFRACTIONATED: 0.41 [IU]/mL (ref 0.30–0.70)

## 2017-04-16 MED ORDER — SODIUM CHLORIDE 0.9 % IV SOLN: 100.0000 mL | INTRAVENOUS | Status: DC | PRN

## 2017-04-16 MED ORDER — SODIUM CHLORIDE 0.9 % IV BOLUS
500.0000 mL | Freq: Once | INTRAVENOUS | Status: AC
  Administered 2017-04-16: 500 mL via INTRAVENOUS

## 2017-04-16 MED ORDER — VANCOMYCIN HCL IN DEXTROSE 1-5 GM/200ML-% IV SOLN: 1000.0000 mg | Freq: Once | INTRAVENOUS | Status: DC

## 2017-04-16 MED ORDER — CINACALCET HCL 30 MG PO TABS
90.0000 mg | ORAL_TABLET | Freq: Every day | ORAL | Status: DC
  Administered 2017-04-16 – 2017-04-23 (×7): 90 mg via ORAL
  Filled 2017-04-16 (×8): qty 3

## 2017-04-16 MED ORDER — HEPARIN SODIUM (PORCINE) 1000 UNIT/ML DIALYSIS
4000.0000 [IU] | INTRAMUSCULAR | Status: DC | PRN
  Filled 2017-04-16: qty 4

## 2017-04-16 MED ORDER — ALTEPLASE 2 MG IJ SOLR
2.0000 mg | Freq: Once | INTRAMUSCULAR | Status: DC | PRN
  Filled 2017-04-16: qty 2

## 2017-04-16 MED ORDER — CALCITRIOL 0.5 MCG PO CAPS
0.7500 ug | ORAL_CAPSULE | Freq: Every day | ORAL | Status: DC
Start: 2017-04-16 — End: 2017-04-23
  Administered 2017-04-16 – 2017-04-22 (×6): 0.75 ug via ORAL
  Filled 2017-04-16 (×7): qty 1

## 2017-04-16 MED ORDER — MUPIROCIN 2 % EX OINT
1.0000 "application " | TOPICAL_OINTMENT | Freq: Two times a day (BID) | CUTANEOUS | Status: DC | PRN
  Filled 2017-04-16: qty 22

## 2017-04-16 MED ORDER — HEPARIN (PORCINE) IN NACL 100-0.45 UNIT/ML-% IJ SOLN
2000.0000 [IU]/h | INTRAMUSCULAR | Status: DC
  Administered 2017-04-16: 1500 [IU]/h via INTRAVENOUS
  Administered 2017-04-17: 1700 [IU]/h via INTRAVENOUS
  Administered 2017-04-17 – 2017-04-19 (×4): 2000 [IU]/h via INTRAVENOUS
  Filled 2017-04-16 (×9): qty 250

## 2017-04-16 MED ORDER — SUCROFERRIC OXYHYDROXIDE 500 MG PO CHEW
500.0000 mg | CHEWABLE_TABLET | Freq: Three times a day (TID) | ORAL | Status: DC
  Administered 2017-04-17 – 2017-04-20 (×7): 500 mg via ORAL
  Filled 2017-04-16 (×14): qty 1

## 2017-04-16 MED ORDER — VANCOMYCIN HCL IN DEXTROSE 1-5 GM/200ML-% IV SOLN: 1000.0000 mg | INTRAVENOUS | Status: DC

## 2017-04-16 MED ORDER — SERTRALINE HCL 50 MG PO TABS
50.0000 mg | ORAL_TABLET | Freq: Every day | ORAL | Status: DC
  Administered 2017-04-16 – 2017-04-22 (×7): 50 mg via ORAL
  Filled 2017-04-16 (×8): qty 1

## 2017-04-16 MED ORDER — SODIUM CHLORIDE 0.9 % IV SOLN
500.0000 mg | INTRAVENOUS | Status: DC
  Administered 2017-04-16 – 2017-04-23 (×8): 500 mg via INTRAVENOUS
  Filled 2017-04-16 (×9): qty 0.5

## 2017-04-16 MED ORDER — PENTAFLUOROPROP-TETRAFLUOROETH EX AERO
1.0000 "application " | INHALATION_SPRAY | CUTANEOUS | Status: DC | PRN
  Filled 2017-04-16: qty 30

## 2017-04-16 MED ORDER — FENTANYL CITRATE (PF) 100 MCG/2ML IJ SOLN
50.0000 ug | Freq: Once | INTRAMUSCULAR | Status: AC
  Administered 2017-04-16: 50 ug via INTRAVENOUS
  Filled 2017-04-16: qty 2

## 2017-04-16 MED ORDER — IOPAMIDOL (ISOVUE-300) INJECTION 61%
100.0000 mL | Freq: Once | INTRAVENOUS | Status: AC | PRN
  Administered 2017-04-16: 100 mL via INTRAVENOUS

## 2017-04-16 MED ORDER — ONDANSETRON HCL 4 MG PO TABS: 4.0000 mg | ORAL_TABLET | Freq: Four times a day (QID) | ORAL | Status: DC | PRN

## 2017-04-16 MED ORDER — GABAPENTIN 100 MG PO CAPS
100.0000 mg | ORAL_CAPSULE | Freq: Every day | ORAL | Status: DC
  Administered 2017-04-16 – 2017-04-22 (×7): 100 mg via ORAL
  Filled 2017-04-16 (×7): qty 1

## 2017-04-16 MED ORDER — ACETAMINOPHEN 500 MG PO TABS
1000.0000 mg | ORAL_TABLET | Freq: Four times a day (QID) | ORAL | Status: DC | PRN
Start: 2017-04-16 — End: 2017-04-23
  Administered 2017-04-16 – 2017-04-21 (×8): 1000 mg via ORAL
  Filled 2017-04-16 (×9): qty 2

## 2017-04-16 MED ORDER — LIDOCAINE HCL (PF) 1 % IJ SOLN: 5.0000 mL | INTRAMUSCULAR | Status: DC | PRN

## 2017-04-16 MED ORDER — LIDOCAINE-PRILOCAINE 2.5-2.5 % EX CREA
1.0000 "application " | TOPICAL_CREAM | CUTANEOUS | Status: DC | PRN
  Filled 2017-04-16: qty 5

## 2017-04-16 MED ORDER — POLYETHYLENE GLYCOL 3350 17 G PO PACK: 17.0000 g | PACK | Freq: Every day | ORAL | Status: DC | PRN

## 2017-04-16 MED ORDER — IOPAMIDOL (ISOVUE-300) INJECTION 61%
INTRAVENOUS | Status: AC
  Filled 2017-04-16: qty 100

## 2017-04-16 MED ORDER — PIPERACILLIN-TAZOBACTAM 3.375 G IVPB 30 MIN
3.3750 g | Freq: Once | INTRAVENOUS | Status: AC
  Administered 2017-04-16: 3.375 g via INTRAVENOUS
  Filled 2017-04-16: qty 50

## 2017-04-16 MED ORDER — ONDANSETRON HCL 4 MG/2ML IJ SOLN
4.0000 mg | Freq: Once | INTRAMUSCULAR | Status: AC
  Administered 2017-04-16: 4 mg via INTRAVENOUS
  Filled 2017-04-16: qty 2

## 2017-04-16 MED ORDER — HEPARIN SODIUM (PORCINE) 1000 UNIT/ML DIALYSIS
1000.0000 [IU] | INTRAMUSCULAR | Status: DC | PRN
  Filled 2017-04-16: qty 1

## 2017-04-16 MED ORDER — ACETAMINOPHEN 325 MG PO TABS
650.0000 mg | ORAL_TABLET | Freq: Once | ORAL | Status: AC
  Administered 2017-04-16: 650 mg via ORAL
  Filled 2017-04-16: qty 2

## 2017-04-16 MED ORDER — RENA-VITE PO TABS
1.0000 | ORAL_TABLET | Freq: Every day | ORAL | Status: DC
  Administered 2017-04-16 – 2017-04-23 (×8): 1 via ORAL
  Filled 2017-04-16 (×8): qty 1

## 2017-04-16 MED ORDER — SODIUM CHLORIDE 0.9 % IV SOLN
2000.0000 mg | Freq: Once | INTRAVENOUS | Status: AC
  Administered 2017-04-16: 2000 mg via INTRAVENOUS
  Filled 2017-04-16: qty 2000

## 2017-04-16 MED ORDER — ONDANSETRON HCL 4 MG/2ML IJ SOLN
4.0000 mg | Freq: Four times a day (QID) | INTRAMUSCULAR | Status: DC | PRN
  Administered 2017-04-16 – 2017-04-17 (×4): 4 mg via INTRAVENOUS
  Filled 2017-04-16 (×4): qty 2

## 2017-04-16 NOTE — Plan of Care (Signed)

## 2017-04-16 NOTE — Consult Note (Addendum)
Chief Complaint: Patient was seen in consultation today for acute pyelonephritis  Referring Physician(s): Dr. Myna Hidalgo  Supervising Physician: Markus Daft  Patient Status: [facility] - ED  History of Present Illness: Judy Kelley is a 52 y.o. female with past medical history of bladder cancer and solitary right kidney who is known to IR from recent nephrostomy tube exchanges who presented to ED overnight with back pain, nausea, and vomiting.  Patient underwent drain exchange on Wednesday and began to develop symptoms yesterday evening. UA obtained and shows concern for possible pyelonephritis.  Patient being admitted for treatment.   IR consulted for drain assessment.   Past Medical History:  Diagnosis Date  . Anemia   . Anxiety   . Cervical cancer (HCC)    Cervical and ovarian  . DVT (deep venous thrombosis) ([place])   . History of kidney stones   . Hypertension    treated years ago, on no medications for at least years ( as of 2019)  . Renal disorder    ESRD - stage 5 Dialysis T/Th/Sa  . Renal insufficiency     Past Surgical History:  Procedure Laterality Date  . AV FISTULA PLACEMENT Right 03/21/2017   Procedure: INSERTION OF ARTERIOVENOUS (AV) GORE-TEX GRAFT RIGHT THIGH;  Surgeon: Angelia Mould, MD;  Location: [facility];  Service: Vascular;  Laterality: Right;  . CYSTOSCOPY N/A 12/09/2016   Procedure: ENDOSCOPY OF ILEAL CONDUIT;  Surgeon: Irine Seal, MD;  Location: [facility];  Service: Urology;  Laterality: N/A;  . GRAFT APPLICATION Right [id number]   INSERTION OF ARTERIOVENOUS (AV) GORE-TEX GRAFT RIGHT THIGH   . ileal conduit    . IR FLUORO GUIDE CV LINE RIGHT  12/09/2016  . IR NEPHROSTOMY EXCHANGE RIGHT  01/21/2017  . IR NEPHROSTOMY EXCHANGE RIGHT  04/13/2017  . IR THROMBECTOMY AV FISTULA W/THROMBOLYSIS/PTA/STENT INC/SHUNT/IMG RT Right 12/09/2016  . IR US GUIDE VASC ACCESS RIGHT  12/09/2016  . IR US GUIDE VASC ACCESS RIGHT  12/09/2016  . nephrosotomy    . TONSILLECTOMY    . TUBAL  LIGATION      Allergies: Ciprofloxacin; Erythromycin; Chlorhexidine; and Other  Medications: Prior to Admission medications   Medication Sig Start Date End Date Taking? Authorizing Provider  acetaminophen (TYLENOL) 500 MG tablet Take 1,000 mg by mouth every 6 (six) hours as needed for mild pain or headache.    Yes [provider]  apixaban (ELIQUIS) 5 MG TABS tablet Take 5 mg by mouth 2 (two) times daily.   Yes [provider]  cinacalcet (SENSIPAR) 90 MG tablet Take 90 mg by mouth daily.   Yes [provider]  gabapentin (NEURONTIN) 100 MG capsule Take 100 mg by mouth at bedtime.   Yes [provider]  multivitamin (RENA-VIT) TABS tablet Take 1 tablet by mouth daily.   Yes [provider]  mupirocin ointment (BACTROBAN) 2 % Apply 1 application topically 2 (two) times daily as needed (rash).    Yes [provider]  promethazine (PHENERGAN) 25 MG tablet Take 25 mg by mouth every 6 (six) hours as needed for nausea or vomiting.   Yes [provider]  sertraline (ZOLOFT) 50 MG tablet Take 50 mg by mouth at bedtime.    Yes [provider]  sucroferric oxyhydroxide (VELPHORO) 500 MG chewable tablet Chew 500 mg by mouth 3 (three) times daily with meals.   Yes [provider]     Family History  Problem Relation Age of Onset  . Diabetes Mellitus II Mother   .  Kidney disease Mother   . Heart disease Mother   . Rheum arthritis Mother   . COPD Mother   . Diabetes Mellitus II Father   . Heart disease Father   . Dementia Father   . Ovarian cancer Maternal Aunt     Social History   Socioeconomic History  . Marital status: Single    Spouse name: Not on file  . Number of children: Not on file  . Years of education: Not on file  . Highest education level: Not on file  Occupational History  . Not on file  Social Needs  . Financial resource strain: Not on file  . Food insecurity:    Worry: Not on file     Inability: Not on file  . Transportation needs:    Medical: Not on file    Non-medical: Not on file  Tobacco Use  . Smoking status: Never Smoker  . Smokeless tobacco: Never Used  Substance and Sexual Activity  . Alcohol use: Yes    Frequency: Never    Comment: occasional wine  . Drug use: No  . Sexual activity: Not on file  Lifestyle  . Physical activity:    Days per week: Not on file    Minutes per session: Not on file  . Stress: Not on file  Relationships  . Social connections:    Talks on phone: Not on file    Gets together: Not on file    Attends religious service: Not on file    Active member of club or organization: Not on file    Attends meetings of clubs or organizations: Not on file    Relationship status: Not on file  Other Topics Concern  . Not on file  Social History Narrative  . Not on file     Review of Systems: A 12 point ROS discussed and pertinent positives are indicated in the HPI above.  All other systems are negative.  Review of Systems  Constitutional: Positive for fever. Negative for fatigue.  Respiratory: Negative for cough and shortness of breath.   Cardiovascular: Negative for chest pain.  Gastrointestinal: Negative for abdominal pain.  Musculoskeletal: Positive for back pain.  Psychiatric/Behavioral: Negative for behavioral problems and confusion.    Vital Signs: BP (!) 103/55   Pulse 64   Temp 99.4 F (37.4 C) (Rectal)   Resp 18   Ht 5\' 7"  (1.702 m)   Wt 251 lb 1.7 oz (113.9 kg)   SpO2 95%   BMI 39.33 kg/m   Physical Exam  Constitutional: She appears well-developed.  Cardiovascular: Normal rate and regular rhythm.  Pulmonary/Chest: Effort normal and breath sounds normal. No respiratory distress.  Musculoskeletal:       Lumbar back: She exhibits tenderness.       Back:  Skin: Skin is warm and dry.  Percutaneous drain intact.  No erythema or pus around insertion site.   Nursing note and vitals reviewed.         Imaging: Dg Chest 2 View  Result Date: 04/15/2017 CLINICAL DATA:  Chest pain.  Shortness of breath today. EXAM: CHEST - 2 VIEW COMPARISON:  None. FINDINGS: Right internal jugular dialysis catheter with tip at the atrial caval junction. Vascular stents in the right axilla.The cardiomediastinal contours are normal. The lungs are clear. Pulmonary vasculature is normal. No consolidation, pleural effusion, or pneumothorax. No acute osseous abnormalities are seen. IMPRESSION: 1. No acute findings. 2. Right dialysis catheter with tip at the atrial caval junction. Electronically  Signed   By: Jeb Levering M.D.   On: 04/15/2017 22:43   Ct Abdomen Pelvis W Contrast  Result Date: 04/16/2017 CLINICAL DATA:  52 year old female with epigastric pain nausea and vomiting beginning yesterday morning. Chronic dialysis patient. EXAM: CT ABDOMEN AND PELVIS WITH CONTRAST TECHNIQUE: Multidetector CT imaging of the abdomen and pelvis was performed using the standard protocol following bolus administration of intravenous contrast. CONTRAST:  151mL ISOVUE-300 IOPAMIDOL (ISOVUE-300) INJECTION 61% COMPARISON:  CT Abdomen and Pelvis 12/05/2016 FINDINGS: Lower chest: Stable cardiomegaly. Catheter tips project in the right atrium and ventricle. No pericardial effusion. Stable lung bases, mild atelectasis greater on the left. No pleural effusion. Hepatobiliary: Negative liver and gallbladder. Pancreas: Negative. Spleen: Negative. Adrenals/Urinary Tract: Adrenal glands remain normal. Complete chronic atrophy of the left renal parenchyma with residual round, dilated left renal collecting system and ureter. Chronic right nephrostomy tube remains in place and appears stable in configuration. However, there is new right perinephric inflammatory stranding and heterogeneously decreased enhancement and contrast excretion from the right kidney on the early and delayed postcontrast images. There is trace gas within the right renal collecting  system. There is no right hydronephrosis. The right ureter is decompressed. Both ureters are thought to course to an ileal conduit which is thought to be visible exiting the ventral abdominal wall on sagittal image 51. The right ureter can be traced to this structure. The distal left ureter is obscured by bowel in the ventral abdomen. No definite urinary bladder is identified. Stomach/Bowel: Negative distal colon. The descending colon is decompressed. There is a chronic large up to 17 centimeter diameter ventral abdominal wall hernia with the 4-5 centimeter hernia neck containing transverse colon and mesentery. This appears stable with no acute inflammation. Along the left superior margin of this hernia is a smaller chronic fat/mesentery only containing hernia which is stable on series 3, image 37. The right colon contains a small volume of fluid. The cecum is located in the midline or slightly to the left of midline in the lower abdomen and anterior pelvis. Negative terminal ileum. The appendix is diminutive or absent. No pericecal inflammation. No dilated small bowel. There is a small bowel to small bowel anastomosis in the right abdomen on series 3, image 64 which is stable and without adverse features. Negative stomach and duodenum. No abdominal free air or free fluid. Vascular/Lymphatic: Aortoiliac calcified atherosclerosis. Suboptimal intravascular contrast timing but the major arterial structures in the abdomen and pelvis appear patent. The portal venous system is patent. There is a right femoral region dialysis graft in place which is new since 2018. There is an adjacent round 2.8 centimeter simple fluid density probable postoperative seroma (series 3, image 91). There are chronic venous collaterals along the left Abdo and in the suprapubic subcutaneous fat. No lymphadenopathy. Reproductive: Negative. Other: Confluent presacral stranding is stable since December. No pelvic free fluid. Musculoskeletal: No  acute osseous abnormality identified. IMPRESSION: 1. Chronic right nephrostomy tube with new right kidney heterogeneous enhancement and perinephric inflammation compared to December 2018. Consider Acute Pyelonephritis. Lack of contrast excretion on the right kidney now suggests progressed right renal insufficiency. 2. Chronic complete left renal atrophy. 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 3. A right femoral vessel dialysis graft has been placed since 2018. A 2.8 centimeter postoperative seroma is adjacent. 4. Chronic pelvic fibrosis, superficial pelvic venous collaterals, Aortic Atherosclerosis (ICD10-I70.0), large ventral abdominal wall hernia containing the transverse colon, and other abdominal and  pelvic findings are stable since December. Electronically Signed   By: Genevie Ann M.D.   On: 04/16/2017 07:44   Ir Nephrostomy Exchange Right  Result Date: 04/13/2017 INDICATION: History of bladder carcinoma with chronic indwelling right percutaneous nephrostomy tube. EXAM: EXCHANGE OF RIGHT PERCUTANEOUS NEPHROSTOMY TUBE COMPARISON:  None. MEDICATIONS: None ANESTHESIA/SEDATION: None CONTRAST:  10 mL Isovue-300-administered into the collecting system(s) FLUOROSCOPY TIME:  Fluoroscopy Time: 42 seconds.  17 mGy. COMPLICATIONS: None immediate. PROCEDURE: Informed written consent was obtained from the patient after a thorough discussion of the procedural risks, benefits and alternatives. All questions were addressed. Maximal Sterile Barrier Technique was utilized including caps, mask, sterile gowns, sterile gloves, sterile drape, hand hygiene and skin antiseptic. A timeout was performed prior to the initiation of the procedure. A pre-existing 14 French nephrostomy tube was injected with contrast material under fluoroscopy. The catheter was cut and removed over a guidewire. A new 4 French catheter was advanced over a guidewire and formed. The catheter was injected with  contrast material and a fluoroscopic spot image obtained to confirm catheter position. The catheter was connected to a gravity drainage bag. IMPRESSION: Exchange of indwelling right percutaneous nephrostomy tube. Electronically Signed   By: Aletta Edouard M.D.   On: 04/13/2017 14:48    Labs:  CBC: Recent Labs    12/13/16 0537 12/14/16 0606 03/21/17 0651 03/21/17 1630 04/15/17 2215  WBC 7.0 6.4  --  7.7 10.5  HGB 8.2* 8.1* 9.9* 10.4* 10.8*  HCT 26.3* 25.7* 29.0* 33.0* 33.7*  PLT 320 307  --  PLATELET CLUMPS NOTED ON SMEAR, COUNT APPEARS ADEQUATE 225    COAGS: Recent Labs    12/03/16 1818  12/06/16 0612 12/06/16 2305 12/07/16 0814 12/08/16 0734 03/21/17 0642 03/21/17 1630  INR 1.14  --   --   --   --   --  1.05 0.97  APTT  --    < > 40* 39* 79* 77*  --   --    < > = values in this interval not displayed.    BMP: Recent Labs    12/13/16 0537 12/14/16 0606 03/21/17 0651 03/21/17 1630 04/15/17 2215  NA 136 134* 141 138 138  K 4.6 4.6 3.4* 4.2 3.6  CL 99* 100*  --  103 99*  CO2 25 23  --  20* 22  GLUCOSE 87 84 99 102* 111*  BUN 53* 62*  --  43* 30*  CALCIUM 8.7* 8.8*  --  9.1 9.7  CREATININE 8.81* 9.63*  --  8.06* 7.58*  GFRNONAA 5* 4*  --  5* 5*  GFRAA 5* 5*  --  6* 6*    LIVER FUNCTION TESTS: Recent Labs    12/03/16 1818 03/21/17 1630 04/16/17 0442  BILITOT 0.5 0.8 0.8  AST 41 21 17  ALT 21 8* 12*  ALKPHOS 137* 40 55  PROT 8.1 6.6 7.7  ALBUMIN 3.1* 3.6 3.5    TUMOR MARKERS: No results for input(s): AFPTM, CEA, CA199, CHROMGRNA in the last 8760 hours.  Assessment and Plan: Acute pyelonephritis Patient s/p recent PCN exchange, now with back pain and suspected pyelonephritis.  She states urine sample was obtained from her bag which is not likely a sterile collection, although clinically indications exist for infection.  Currently on IV abx.  Discussed case with Dr. Anselm Pancoast. No plans for drain exchange at this time.  Recommend continued treatment  plan.  IR available if needed.   Thank you for this interesting consult.  I greatly  enjoyed meeting Judy Kelley and look forward to participating in their care.  A copy of this report was sent to the requesting provider on this date.  Electronically Signed: Docia Barrier, PA 04/16/2017, 11:49 AM   I spent a total of 40 Minutes    in face to face in clinical consultation, greater than 50% of which was counseling/coordinating care for acute pyelonephritis.

## 2017-04-16 NOTE — Progress Notes (Addendum)
Pharmacy Antibiotic Note  Malayna Noori is a 52 y.o. female admitted on 04/15/2017 with sepsis.  Pharmacy has been consulted for Vancomycin dosing. ?urinary source. WBC WNL. ESRD on HD Tues/Thurs/Sat.   Plan: -Vancomycin 2000 mg IV x 1, then 1000 mg IV qHD Tues/Thurs/Sat -Zosyn 3.375g IV x 1 given in the ED, f/u additional gram negative coverage -Trend WBC, temp, HD schedule -F/U infectious work-up -Drug levels as indicated  Temp (24hrs), Avg:99.2 F (37.3 C), Min:98.8 F (37.1 C), Max:99.5 F (37.5 C)  Recent Labs  Lab 04/15/17 2215 04/16/17 0529  WBC 10.5  --   CREATININE 7.58*  --   LATICACIDVEN  --  1.27    Estimated Creatinine Clearance: 11.3 mL/min (A) (by C-G formula based on SCr of 7.58 mg/dL (H)).    Allergies  Allergen Reactions  . Ciprofloxacin Other (See Comments)    IV only-burning to her veins, can take PO  . Erythromycin Anaphylaxis  . Chlorhexidine Rash  . Other Rash and Other (See Comments)    chloraprep     [name] 04/16/2017 5:58 AM

## 2017-04-16 NOTE — ED Provider Notes (Signed)
[facility] EMERGENCY DEPARTMENT Provider Note   CSN: [id number] Arrival date & time: 04/15/17  2147     History   Chief Complaint Chief Complaint  Patient presents with  . Abdominal Pain  . Chest Pain    HPI Judy Kelley is a 52 y.o. female.  Patient with ESRD on dialysis, DVT on Eliquis, previous ovarian cancer with urostomy and right nephrostomy presenting with a 1 day history of fever, nausea, vomiting, diarrhea and abdominal pain.  States she started to feel ill on the morning of April 12.  Fevers up to 102 at home.  4-5 episodes of nonbloody emesis and 4-5 episodes of nonbloody stool.  Diffuse abdominal tenderness right above her healing urostomy where she has a chronic wound.  Right sided nephrostomy in place last changed 3 weeks ago.  She feels weak and dehydrated.  Denies any shortness of breath or cough.  Denies any sick contacts or recent travel.  She is due for dialysis today.  She still makes a significant amount of urine in her nephrostomy.  The history is provided by the patient.  Abdominal Pain   Associated symptoms include fever, diarrhea, nausea, vomiting, dysuria, hematuria, arthralgias and myalgias.  Chest Pain   Associated symptoms include abdominal pain, back pain, a fever, nausea, vomiting and weakness. Pertinent negatives include no cough, no dizziness and no shortness of breath.    Past Medical History:  Diagnosis Date  . Anemia   . Anxiety   . Cervical cancer (HCC)    Cervical and ovarian  . DVT (deep venous thrombosis) ([place])   . History of kidney stones   . Hypertension    treated years ago, on no medications for at least years ( as of 2019)  . Renal disorder    ESRD - stage 5 Dialysis T/Th/Sa    Patient Active Problem List   Diagnosis Date Noted  . ESRD (end stage renal disease) ([place]) 03/21/2017  . MRSA bacteremia   . ESRD on dialysis ([place]) 12/04/2016  . Hypokalemia 12/04/2016  . History of ovarian cancer 12/04/2016     Past Surgical History:  Procedure Laterality Date  . AV FISTULA PLACEMENT Right 03/21/2017   Procedure: INSERTION OF ARTERIOVENOUS (AV) GORE-TEX GRAFT RIGHT THIGH;  Surgeon: Angelia Mould, MD;  Location: [place];  Service: Vascular;  Laterality: Right;  . CYSTOSCOPY N/A 12/09/2016   Procedure: ENDOSCOPY OF ILEAL CONDUIT;  Surgeon: Irine Seal, MD;  Location: [place];  Service: Urology;  Laterality: N/A;  . GRAFT APPLICATION Right 65/46/5035   INSERTION OF ARTERIOVENOUS (AV) GORE-TEX GRAFT RIGHT THIGH   . ileal conduit    . IR FLUORO GUIDE CV LINE RIGHT  12/09/2016  . IR NEPHROSTOMY EXCHANGE RIGHT  01/21/2017  . IR NEPHROSTOMY EXCHANGE RIGHT  04/13/2017  . IR THROMBECTOMY AV FISTULA W/THROMBOLYSIS/PTA/STENT INC/SHUNT/IMG RT Right 12/09/2016  . IR US GUIDE VASC ACCESS RIGHT  12/09/2016  . IR US GUIDE VASC ACCESS RIGHT  12/09/2016  . nephrosotomy    . TONSILLECTOMY    . TUBAL LIGATION       OB History   None      Home Medications    Prior to Admission medications   Medication Sig Start Date End Date Taking? Authorizing Provider  acetaminophen (TYLENOL) 500 MG tablet Take 1,000 mg by mouth every 6 (six) hours as needed for mild pain or headache.     [provider]  apixaban (ELIQUIS) 5 MG TABS tablet Take 5 mg by  mouth 2 (two) times daily.    [provider]  calcium acetate (PHOSLO) 667 MG capsule Take 1,334-2,001 mg by mouth See admin instructions. Take 2001 mg by mouth 3 times daily with meals and take 1334 mg by mouth with snacks    [provider]  cinacalcet (SENSIPAR) 90 MG tablet Take 90 mg by mouth daily.    [provider]  gabapentin (NEURONTIN) 100 MG capsule Take 100 mg by mouth at bedtime.    [provider]  multivitamin (RENA-VIT) TABS tablet Take 1 tablet by mouth daily.    [provider]  mupirocin ointment (BACTROBAN) 2 % Apply 1 application topically 2 (two) times daily.    [provider]   promethazine (PHENERGAN) 25 MG tablet Take 25 mg by mouth every 6 (six) hours as needed for nausea or vomiting.    [provider]  sertraline (ZOLOFT) 50 MG tablet Take 50 mg by mouth at bedtime.     [provider]    Family History Family History  Problem Relation Age of Onset  . Diabetes Mellitus II Mother   . Kidney disease Mother   . Heart disease Mother   . Rheum arthritis Mother   . COPD Mother   . Diabetes Mellitus II Father   . Heart disease Father   . Dementia Father   . Ovarian cancer Maternal Aunt     Social History Social History   Tobacco Use  . Smoking status: Never Smoker  . Smokeless tobacco: Never Used  Substance Use Topics  . Alcohol use: Yes    Frequency: Never    Comment: occasional wine  . Drug use: No     Allergies   Ciprofloxacin; Erythromycin; Chlorhexidine; and Other   Review of Systems Review of Systems  Constitutional: Positive for activity change, appetite change, chills and fever.  HENT: Negative for congestion and rhinorrhea.   Eyes: Negative for visual disturbance.  Respiratory: Negative for cough, chest tightness and shortness of breath.   Cardiovascular: Positive for chest pain.  Gastrointestinal: Positive for abdominal pain, diarrhea, nausea and vomiting.  Genitourinary: Positive for dysuria and hematuria. Negative for vaginal bleeding and vaginal discharge.  Musculoskeletal: Positive for arthralgias, back pain and myalgias.  Skin: Negative for rash.  Neurological: Positive for weakness and light-headedness. Negative for dizziness.   all other systems are negative except as noted in the HPI and PMH.     Physical Exam Updated Vital Signs BP (!) 104/56   Pulse 76   Temp 98.8 F (37.1 C)   Resp 18   SpO2 99% Comment: correction  Physical Exam  Constitutional: She is oriented to person, place, and time. She appears well-developed and well-nourished. She appears ill. No distress.  HENT:  Head:  Normocephalic and atraumatic.  Mouth/Throat: Oropharynx is clear and moist. No oropharyngeal exudate.  Eyes: Pupils are equal, round, and reactive to light. Conjunctivae and EOM are normal.  Neck: Normal range of motion. Neck supple.  No meningismus.  Cardiovascular: Normal rate, regular rhythm, normal heart sounds and intact distal pulses.  No murmur heard. Pulmonary/Chest: Effort normal and breath sounds normal. No respiratory distress. She exhibits no tenderness.  Abdominal: Soft. There is tenderness. There is no rebound and no guarding.  Diffuse abdominal tenderness there is a chronic wound above patient's umbilicus that appears to be clean  Musculoskeletal: Normal range of motion. She exhibits no edema or tenderness.  Right-sided nephrostomy appears clean  Recent R thigh surgical site appears clean.  Patient reports graft is nonfunctional.  Neurological: She is alert and oriented to person, place, and time. No cranial nerve deficit. She exhibits normal muscle tone. Coordination normal.  No ataxia on finger to nose bilaterally. No pronator drift. 5/5 strength throughout. CN 2-12 intact.Equal grip strength. Sensation intact.   Skin: Skin is warm. Capillary refill takes less than 2 seconds. No rash noted.  Psychiatric: She has a normal mood and affect. Her behavior is normal.  Nursing note and vitals reviewed.    ED Treatments / Results  Labs (all labs ordered are listed, but only abnormal results are displayed) Labs Reviewed  BASIC METABOLIC PANEL - Abnormal; Notable for the following components:      Result Value   Chloride 99 (*)    Glucose, Bld 111 (*)    BUN 30 (*)    Creatinine, Ser 7.58 (*)    GFR calc non Af Amer 5 (*)    GFR calc Af Amer 6 (*)    Anion gap 17 (*)    All other components within normal limits  CBC - Abnormal; Notable for the following components:   RBC 3.48 (*)    Hemoglobin 10.8 (*)    HCT 33.7 (*)    All other components within normal limits    URINALYSIS, ROUTINE W REFLEX MICROSCOPIC - Abnormal; Notable for the following components:   APPearance CLOUDY (*)    Hgb urine dipstick MODERATE (*)    Protein, ur 100 (*)    Leukocytes, UA LARGE (*)    Bacteria, UA MANY (*)    Squamous Epithelial / LPF 6-30 (*)    Non Squamous Epithelial 0-5 (*)    All other components within normal limits  HEPATIC FUNCTION PANEL - Abnormal; Notable for the following components:   ALT 12 (*)    All other components within normal limits  I-STAT BETA HCG BLOOD, ED ([facility], [facility], [facility] ONLY) - Abnormal; Notable for the following components:   I-stat hCG, quantitative 5.3 (*)    All other components within normal limits  CULTURE, BLOOD (ROUTINE X 2)  CULTURE, BLOOD (ROUTINE X 2)  URINE CULTURE  LIPASE, BLOOD  I-STAT TROPONIN, ED  I-STAT TROPONIN, ED  I-STAT CG4 LACTIC ACID, ED  I-STAT CG4 LACTIC ACID, ED    EKG EKG Interpretation  Date/Time:  Friday April 15 2017 22:13:27 EDT Ventricular Rate:  108 PR Interval:  158 QRS Duration: 76 QT Interval:  352 QTC Calculation: 471 R Axis:   24 Text Interpretation:  Sinus tachycardia Low voltage QRS Possible Inferior infarct , age undetermined Cannot rule out Anterior infarct , age undetermined Abnormal ECG rate Rate faster Confirmed by Ezequiel Essex [phone]) on 04/16/2017 4:44:56 AM   Radiology Dg Chest 2 View  Result Date: 04/15/2017 CLINICAL DATA:  Chest pain.  Shortness of breath today. EXAM: CHEST - 2 VIEW COMPARISON:  None. FINDINGS: Right internal jugular dialysis catheter with tip at the atrial caval junction. Vascular stents in the right axilla.The cardiomediastinal contours are normal. The lungs are clear. Pulmonary vasculature is normal. No consolidation, pleural effusion, or pneumothorax. No acute osseous abnormalities are seen. IMPRESSION: 1. No acute findings. 2. Right dialysis catheter with tip at the atrial caval junction. Electronically Signed   By: Jeb Levering M.D.   On: 04/15/2017 22:43     Procedures Procedures (including critical care time)  Medications Ordered in ED Medications  sodium chloride 0.9 % bolus 500 mL (has no administration in time range)  piperacillin-tazobactam (ZOSYN) IVPB  3.375 g (has no administration in time range)  acetaminophen (TYLENOL) tablet 650 mg (has no administration in time range)  fentaNYL (SUBLIMAZE) injection 50 mcg (has no administration in time range)  ondansetron (ZOFRAN) injection 4 mg (has no administration in time range)  vancomycin (VANCOCIN) 2,000 mg in sodium chloride 0.9 % 500 mL IVPB (has no administration in time range)     Initial Impression / Assessment and Plan / ED Course  I have reviewed the triage vital signs and the nursing notes.  Pertinent labs & imaging results that were available during my care of the patient were reviewed by me and considered in my medical decision making (see chart for details).      Dialysis patient with nephrostomy presenting with 1 day of fever, nausea, vomiting and diarrhea.  She appears ill but nontoxic.  Abdomen is soft but diffusely tender.  She appears dry and will be given gentle fluid bolus.  Broad spectrum antibiotics given after cultures obtained. Suspect urinary source given nephrostomy. Abdominal and R thigh wounds appear clean.  UA consistent with infection. BP remains soft but lactate and mental status normal. No leukocytosis. No fever in the ED.  CT will be obtained for further evaluate possible source of infection.  Admission d/w Dr. Myna Hidalgo.  CRITICAL CARE Performed by: Ezequiel Essex Total critical care time: 35 minutes Critical care time was exclusive of separately billable procedures and treating other patients. Critical care was necessary to treat or prevent imminent or life-threatening deterioration. Critical care was time spent personally by me on the following activities: development of treatment plan with patient and/or surrogate as well as nursing,  discussions with consultants, evaluation of patient's response to treatment, examination of patient, obtaining history from patient or surrogate, ordering and performing treatments and interventions, ordering and review of laboratory studies, ordering and review of radiographic studies, pulse oximetry and re-evaluation of patient's condition.    Final Clinical Impressions(s) / ED Diagnoses   Final diagnoses:  Sepsis, due to unspecified organism [facility])  Gastroenteritis    ED Discharge Orders    None       Karthikeya Funke, Annie Main, MD 04/16/17 [phone]

## 2017-04-16 NOTE — Progress Notes (Signed)
Dialysis treatment completed.  500 mL ultrafiltrated.  0 mL net fluid removal.  Patient status unchanged. Lung sounds diminished to ausculation in all fields. Generalized edema. Cardiac: NSR.  Cleansed RIJ catheter with chlorhexidine.  Disconnected lines and flushed ports with saline per protocol.  Ports locked with heparin and capped per protocol.    Report given to bedside, RN Merrie Roof.

## 2017-04-16 NOTE — ED Notes (Signed)
Admitting Provider at bedside. 

## 2017-04-16 NOTE — H&P (Signed)
History and Physical:    Judy Kelley   ZOX:[id number] DOB: 11-Feb-1965 DOA: 04/15/2017  Referring MD/provider: Dr Myna Hidalgo PCP: System, Pcp Not In   Patient coming from: Home  Chief Complaint: fever,nausea, vomiting, abdominal pain   History of Present Illness:   Judy Kelley is an 52 y.o. 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 Patient underwent change out of her nephrostomy tube 3 days ago at [facility] and did well until yesterday when she noted onset of abdominal pain, nausea, vomiting, diarrhea and fevers. Patient states that she's had diarrhea 6 and vomiting 5 last episode 8:30 last night, no blood. Patient is due for dialysis today. Patient denies chest pain or shortness of breath. Patient admits to fevers and chills.   ED Course:  The patient was noted to have an abnormal urine, was started on vancomycin and Zosyn and abdominal CT was done which showed acute pyelonephritis. Patient is admitted for further workup. Renal has been consult did and is planning on dialysis this morning.  ROS:   ROS   Review of Systems: No weight changes No vision changes No changes in hearing No polyuria or polydipsia No shortness of breath or hemoptysis No chest pain or palpitations No numbness tingling dizziness or headache   Past Medical History:   Past Medical History:  Diagnosis Date  . Anemia   . Anxiety   . Cervical cancer (HCC)    Cervical and ovarian  . DVT (deep venous thrombosis) ([place])   . History of  kidney stones   . Hypertension    treated years ago, on no medications for at least years ( as of 2019)  . Renal disorder    ESRD - stage 5 Dialysis T/Th/Sa  . Renal insufficiency     Past Surgical History:   Past Surgical History:  Procedure Laterality Date  . AV FISTULA PLACEMENT Right 03/21/2017   Procedure: INSERTION OF ARTERIOVENOUS (AV) GORE-TEX GRAFT RIGHT THIGH;  Surgeon: Angelia Mould, MD;  Location: [facility];  Service: Vascular;  Laterality: Right;  . CYSTOSCOPY N/A 12/09/2016   Procedure: ENDOSCOPY OF ILEAL CONDUIT;  Surgeon: Irine Seal, MD;  Location: [facility];  Service: Urology;  Laterality: N/A;  . GRAFT APPLICATION Right 81/19/1478   INSERTION OF ARTERIOVENOUS (AV) GORE-TEX GRAFT RIGHT THIGH   . ileal conduit    . IR FLUORO GUIDE CV LINE RIGHT  12/09/2016  . IR NEPHROSTOMY EXCHANGE RIGHT  01/21/2017  . IR NEPHROSTOMY EXCHANGE RIGHT  04/13/2017  . IR THROMBECTOMY AV FISTULA W/THROMBOLYSIS/PTA/STENT INC/SHUNT/IMG RT Right 12/09/2016  . IR US GUIDE VASC ACCESS RIGHT  12/09/2016  . IR US GUIDE VASC ACCESS RIGHT  12/09/2016  . nephrosotomy    . TONSILLECTOMY    . TUBAL LIGATION      Social History:   Social History   Socioeconomic History  . Marital status: Single    Spouse name: Not on file  . Number of children: Not on file  . Years of education: Not on file  . Highest education level: Not on file  Occupational History  . Not on file  Social Needs  . Financial resource strain: Not on file  . Food insecurity:    Worry: Not on file    Inability: Not on file  . Transportation needs:    Medical: Not on file    Non-medical: Not on file  Tobacco Use  . Smoking status: Never Smoker  . Smokeless tobacco: Never Used  Substance and Sexual Activity  . Alcohol use: Yes    Frequency: Never    Comment: occasional wine  . Drug use: No  . Sexual activity: Not on file  Lifestyle  . Physical activity:    Days per week: Not on file    Minutes per session: Not on  file  . Stress: Not on file  Relationships  . Social connections:    Talks on phone: Not on file    Gets together: Not on file    Attends religious service: Not on file    Active member of club or organization: Not on file    Attends meetings of clubs or organizations: Not on file    Relationship status: Not on file  . Intimate partner violence:    Fear of current or ex partner: Not on file    Emotionally abused: Not on file    Physically abused: Not on file    Forced sexual activity: Not on file  Other Topics Concern  . Not on file  Social History Narrative  . Not on file    Allergies   Ciprofloxacin; Erythromycin; Chlorhexidine; and Other  Family history:   Family History  Problem Relation Age of Onset  . Diabetes Mellitus II Mother   . Kidney disease Mother   . Heart disease Mother   . Rheum arthritis Mother   . COPD Mother   . Diabetes Mellitus II Father   . Heart disease Father   . Dementia Father   . Ovarian cancer Maternal Aunt     Current Medications:   Prior to Admission medications   Medication Sig Start Date End Date Taking? Authorizing Provider  acetaminophen (TYLENOL) 500 MG tablet Take 1,000 mg by mouth every 6 (six) hours as needed for mild pain or headache.    Yes [provider]  apixaban (ELIQUIS) 5 MG TABS tablet Take 5 mg by mouth 2 (two) times daily.   Yes [provider]  cinacalcet (SENSIPAR) 90 MG tablet Take 90 mg by mouth daily.   Yes [provider]  gabapentin (NEURONTIN) 100 MG capsule Take 100 mg by mouth at bedtime.   Yes [provider]  multivitamin (RENA-VIT) TABS tablet Take 1 tablet by mouth daily.   Yes [provider]  mupirocin ointment (BACTROBAN) 2 % Apply 1 application topically 2 (two) times daily as needed (rash).    Yes [provider]  promethazine (PHENERGAN) 25 MG tablet Take 25 mg by mouth every 6 (six) hours as needed for nausea or vomiting.   Yes [provider]  sertraline (ZOLOFT) 50 MG tablet Take 50 mg by mouth at bedtime.    Yes [provider]  sucroferric oxyhydroxide (VELPHORO) 500 MG chewable tablet Chew 500 mg by mouth 3 (three) times daily with meals.   Yes [provider]    Physical Exam:   Vitals:   04/16/17 0630 04/16/17 0724 04/16/17 0730 04/16/17 0906  BP: (!) 103/49 (!) 87/45 (!) 87/67 (!) 98/53  Pulse: 65 (!) 58 60 72  Resp: 15 20 (!) 21 17  Temp:  TempSrc:      SpO2: 95% 95% 96% 99%     Physical Exam: Blood pressure (!) 98/53, pulse 72, temperature 99.4 F (37.4 C), temperature source Rectal, resp. rate 17, SpO2 99 %. Gen: Obese female lying in bed in no distress area Eyes: Sclerae anicteric. Conjunctiva mildly injected. Chest: reasonable air entry bilaterally with rales at bases. CV: Regular with soft systolic murmur left sternal border without radiation Abdomen: normoactive bowel sounds. Soft. Obese. NABS, soft, nondistended, nontender. Patient has a chronic well-healed wound upper abdominal area, no purulence or redness. Right-sided nephrostomy is without redness or obvious purulence. She is tender to palpation diffusely but primarily in the rightupper and lower quadrant. Extremities: she does have evidence of recent graft placement on right thigh. No thrill. No evidence of infection redness or purulence. Neuro: Alert and oriented times 3; grossly nonfocal. Psych: Patient is cooperative, logical and coherent.   Data Review:    Labs: Basic Metabolic Panel: Recent Labs  Lab 04/15/17 2215  NA 138  K 3.6  CL 99*  CO2 22  GLUCOSE 111*  BUN 30*  CREATININE 7.58*  CALCIUM 9.7   Liver Function Tests: Recent Labs  Lab 04/16/17 0442  AST 17  ALT 12*  ALKPHOS 55  BILITOT 0.8  PROT 7.7  ALBUMIN 3.5   Recent Labs  Lab 04/15/17 2215  LIPASE 44   No results for input(s): AMMONIA in the last 168 hours. CBC: Recent Labs  Lab 04/15/17 2215  WBC 10.5  HGB 10.8*    HCT 33.7*  MCV 96.8  PLT 225   Cardiac Enzymes: No results for input(s): CKTOTAL, CKMB, CKMBINDEX, TROPONINI in the last 168 hours.  BNP (last 3 results) No results for input(s): PROBNP in the last 8760 hours. CBG: No results for input(s): GLUCAP in the last 168 hours.  Urinalysis    Component Value Date/Time   COLORURINE YELLOW 04/16/2017 0518   APPEARANCEUR CLOUDY (A) 04/16/2017 0518   LABSPEC 1.014 04/16/2017 0518   PHURINE 6.0 04/16/2017 0518   GLUCOSEU NEGATIVE 04/16/2017 0518   HGBUR MODERATE (A) 04/16/2017 0518   BILIRUBINUR NEGATIVE 04/16/2017 0518   KETONESUR NEGATIVE 04/16/2017 0518   PROTEINUR 100 (A) 04/16/2017 0518   NITRITE NEGATIVE 04/16/2017 0518   LEUKOCYTESUR LARGE (A) 04/16/2017 0518      Radiographic Studies: Dg Chest 2 View  Result Date: 04/15/2017 CLINICAL DATA:  Chest pain.  Shortness of breath today. EXAM: CHEST - 2 VIEW COMPARISON:  None. FINDINGS: Right internal jugular dialysis catheter with tip at the atrial caval junction. Vascular stents in the right axilla.The cardiomediastinal contours are normal. The lungs are clear. Pulmonary vasculature is normal. No consolidation, pleural effusion, or pneumothorax. No acute osseous abnormalities are seen. IMPRESSION: 1. No acute findings. 2. Right dialysis catheter with tip at the atrial caval junction. Electronically Signed   By: Jeb Levering M.D.   On: 04/15/2017 22:43   Ct Abdomen Pelvis W Contrast  Result Date: 04/16/2017 CLINICAL DATA:  52 year old female with epigastric pain nausea and vomiting beginning yesterday morning. Chronic dialysis patient. EXAM: CT ABDOMEN AND PELVIS WITH CONTRAST TECHNIQUE: Multidetector CT imaging of the abdomen and pelvis was performed using the standard protocol following bolus administration of intravenous contrast. CONTRAST:  127mL ISOVUE-300 IOPAMIDOL (ISOVUE-300) INJECTION 61% COMPARISON:  CT Abdomen and Pelvis 12/05/2016 FINDINGS: Lower chest: Stable cardiomegaly.  Catheter tips project in the right atrium and ventricle. No pericardial effusion. Stable lung bases, mild atelectasis greater on the left. No pleural effusion. Hepatobiliary:  Negative liver and gallbladder. Pancreas: Negative. Spleen: Negative. Adrenals/Urinary Tract: Adrenal glands remain normal. Complete chronic atrophy of the left renal parenchyma with residual round, dilated left renal collecting system and ureter. Chronic right nephrostomy tube remains in place and appears stable in configuration. However, there is new right perinephric inflammatory stranding and heterogeneously decreased enhancement and contrast excretion from the right kidney on the early and delayed postcontrast images. There is trace gas within the right renal collecting system. There is no right hydronephrosis. The right ureter is decompressed. Both ureters are thought to course to an ileal conduit which is thought to be visible exiting the ventral abdominal wall on sagittal image 51. The right ureter can be traced to this structure. The distal left ureter is obscured by bowel in the ventral abdomen. No definite urinary bladder is identified. Stomach/Bowel: Negative distal colon. The descending colon is decompressed. There is a chronic large up to 17 centimeter diameter ventral abdominal wall hernia with the 4-5 centimeter hernia neck containing transverse colon and mesentery. This appears stable with no acute inflammation. Along the left superior margin of this hernia is a smaller chronic fat/mesentery only containing hernia which is stable on series 3, image 37. The right colon contains a small volume of fluid. The cecum is located in the midline or slightly to the left of midline in the lower abdomen and anterior pelvis. Negative terminal ileum. The appendix is diminutive or absent. No pericecal inflammation. No dilated small bowel. There is a small bowel to small bowel anastomosis in the right abdomen on series 3, image 64 which is  stable and without adverse features. Negative stomach and duodenum. No abdominal free air or free fluid. Vascular/Lymphatic: Aortoiliac calcified atherosclerosis. Suboptimal intravascular contrast timing but the major arterial structures in the abdomen and pelvis appear patent. The portal venous system is patent. There is a right femoral region dialysis graft in place which is new since 2018. There is an adjacent round 2.8 centimeter simple fluid density probable postoperative seroma (series 3, image 91). There are chronic venous collaterals along the left Abdo and in the suprapubic subcutaneous fat. No lymphadenopathy. Reproductive: Negative. Other: Confluent presacral stranding is stable since December. No pelvic free fluid. Musculoskeletal: No acute osseous abnormality identified. IMPRESSION: 1. Chronic right nephrostomy tube with new right kidney heterogeneous enhancement and perinephric inflammation compared to December 2018. Consider Acute Pyelonephritis. Lack of contrast excretion on the right kidney now suggests progressed right renal insufficiency. 2. Chronic complete left renal atrophy. 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 3. A right femoral vessel dialysis graft has been placed since 2018. A 2.8 centimeter postoperative seroma is adjacent. 4. Chronic pelvic fibrosis, superficial pelvic venous collaterals, Aortic Atherosclerosis (ICD10-I70.0), large ventral abdominal wall hernia containing the transverse colon, and other abdominal and pelvic findings are stable since December. Electronically Signed   By: Genevie Ann M.D.   On: 04/16/2017 07:44    EKG: Independently reviewed. Sinus rhythm at 100. Isolated Q in 3. No acute ST-T wave changes.   Assessment/Plan:   Principal Problem:   Pyelonephritis Active Problems:   ESRD on dialysis ([place])   Chronic anticoagulation   History of adverse effect of venous thromboembolism (VTE) prophylaxis    Depression  52 year old female with history of multiple infections, multiple failed grafts while on Eliquis is admitted with pyelonephritis after recent change out of nephrostomy 3 days ago.  PYELONEPHRITIS Patient with history of multiple infections including MRSA.Will treat polynephritis  with IV vancomycin and imipenem. Interventional radiology consult did for nephrostomy change.  ESRD Patient seen by [facility] who will initiate dialysis today.  VTE Patient has been on Eliquis ith which she states she is compliant despite multiple episodes of clotted grafts and fistula in the past. Will hold Eliquis for now and start IV heparin or nephrostomy change.  PSYCH Will continue patient on her zoloft. Given so many clotted grafts and multiple infections, I suspect there may be some social and or psychological component to her recurrent possibly preventable illnesses. 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   Other information:   DVT prophylaxis: on IV heparin Code Status: Full code. Family Communication: patient states she is living with friends who know she is in the hospital  Disposition Plan: home Consults called: renal,interventional radiology Admission status: inpatient   The medical decision making on this patient was of high complexity and the patient is at high risk for clinical deterioration, therefore this is a level 3 visit.   Dewaine Oats Tublu Larene Ascencio [facility]   If 7PM-7AM, please contact night-coverage [email or web address] Password [facility] 04/16/2017, 9:52 AM

## 2017-04-16 NOTE — ED Notes (Signed)
Patient transported to CT 

## 2017-04-16 NOTE — ED Notes (Signed)
Labs reviewed.

## 2017-04-16 NOTE — Procedures (Signed)
   I was present at this dialysis session, have reviewed the session itself and made  appropriate changes Kelly Splinter MD [place] pager [phone]   04/16/2017, 4:50 PM

## 2017-04-16 NOTE — Consult Note (Addendum)
[facility] Renal Consultation Note    Indication for Consultation:  Management of ESRD/hemodialysis; anemia, hypertension/volume and secondary hyperparathyroidism  TDV:VOHYWV, Pcp Not In  HPI: Montie Gelardi is a 52 y.o. female. 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  Her nephrostomy tube is changed routinely every 3 months and was last done on 4/10 by IR.  Patient also has history of occluded dialysis access, including AVG in b/l upper extremities, and most recently R thigh AVG, placed on 03/21/17.    Ms. Yarde has been admitted for suspected pyelonephritis.  Seen and examined at bedside.  States she has had abdominal pain, nausea, diarrhea and vomiting x1week, and fever chills x1 day.  Denies SOB, CP, edema and orthopnea.  Pertinent findings in the ED included low grade temp, hypotension, and UA and CT scan concerning for R pyelonephritis.   Of note patient is complaint with prescribed dialysis regimen.  Her last treatment was on 4/11, well tolerated and completed without complications reaching her EDW.  She has now been deemed as TDC dependent per [facility]VS, due to rapid occlusion of AVG while on anticoagulation.    Past Medical History:  Diagnosis Date  . Anemia   . Anxiety   . Cervical cancer (HCC)    Cervical and ovarian  . DVT (deep venous thrombosis) ([place])   . History of kidney stones   . Hypertension    treated years ago, on no medications for at least years ( as of 2019)  . Renal disorder    ESRD - stage 5 Dialysis T/Th/Sa  . Renal insufficiency    Past Surgical History:  Procedure Laterality Date  . AV FISTULA PLACEMENT Right 03/21/2017   Procedure: INSERTION OF ARTERIOVENOUS  (AV) GORE-TEX GRAFT RIGHT THIGH;  Surgeon: Angelia Mould, MD;  Location: [place];  Service: Vascular;  Laterality: Right;  . CYSTOSCOPY N/A 12/09/2016   Procedure: ENDOSCOPY OF ILEAL CONDUIT;  Surgeon: Irine Seal, MD;  Location: [place];  Service: Urology;  Laterality: N/A;  . GRAFT APPLICATION Right 37/10/6267   INSERTION OF ARTERIOVENOUS (AV) GORE-TEX GRAFT RIGHT THIGH   . ileal conduit    . IR FLUORO GUIDE CV LINE RIGHT  12/09/2016  . IR NEPHROSTOMY EXCHANGE RIGHT  01/21/2017  . IR NEPHROSTOMY EXCHANGE RIGHT  04/13/2017  . IR THROMBECTOMY AV FISTULA W/THROMBOLYSIS/PTA/STENT INC/SHUNT/IMG RT Right 12/09/2016  . IR US GUIDE VASC ACCESS RIGHT  12/09/2016  . IR US GUIDE VASC ACCESS RIGHT  12/09/2016  . nephrosotomy    . TONSILLECTOMY    . TUBAL LIGATION     Family History  Problem Relation Age of Onset  . Diabetes Mellitus II Mother   . Kidney disease Mother   . Heart disease Mother   . Rheum arthritis Mother   . COPD Mother   . Diabetes Mellitus II Father   . Heart disease Father   . Dementia Father   . Ovarian cancer Maternal Aunt    Social History:  reports that she has never smoked. She has never used smokeless tobacco. She reports that she drinks alcohol. She reports that she does not use drugs. Allergies  Allergen Reactions  . Ciprofloxacin Other (See Comments)    IV only-burning to her veins,  can take PO  . Erythromycin Anaphylaxis  . Chlorhexidine Rash  . Other Rash and Other (See Comments)    chloraprep   Prior to Admission medications   Medication Sig Start Date End Date Taking? Authorizing Provider  acetaminophen (TYLENOL) 500 MG tablet Take 1,000 mg by mouth every 6 (six) hours as needed for mild pain or headache.    Yes [provider]  apixaban (ELIQUIS) 5 MG TABS tablet Take 5 mg by mouth 2 (two) times daily.   Yes [provider]  cinacalcet (SENSIPAR) 90 MG tablet Take 90 mg by mouth daily.   Yes [provider]  gabapentin  (NEURONTIN) 100 MG capsule Take 100 mg by mouth at bedtime.   Yes [provider]  multivitamin (RENA-VIT) TABS tablet Take 1 tablet by mouth daily.   Yes [provider]  mupirocin ointment (BACTROBAN) 2 % Apply 1 application topically 2 (two) times daily as needed (rash).    Yes [provider]  promethazine (PHENERGAN) 25 MG tablet Take 25 mg by mouth every 6 (six) hours as needed for nausea or vomiting.   Yes [provider]  sertraline (ZOLOFT) 50 MG tablet Take 50 mg by mouth at bedtime.    Yes [provider]  sucroferric oxyhydroxide (VELPHORO) 500 MG chewable tablet Chew 500 mg by mouth 3 (three) times daily with meals.   Yes [provider]   Current Facility-Administered Medications  Medication Dose Route Frequency Provider Last Rate Last Dose  . 0.9 %  sodium chloride infusion  100 mL Intravenous PRN Penninger, Ria Comment, PA      . 0.9 %  sodium chloride infusion  100 mL Intravenous PRN Penninger, Ria Comment, PA      . acetaminophen (TYLENOL) tablet 1,000 mg  1,000 mg Oral Q6H PRN Bonnell Public Tublu, MD   1,000 mg at 04/16/17 1217  . alteplase (CATHFLO ACTIVASE) injection 2 mg  2 mg Intracatheter Once PRN Penninger, Ria Comment, PA      . calcitRIOL (ROCALTROL) capsule 0.75 mcg  0.75 mcg Oral Daily Bonnell Public Tublu, MD   0.75 mcg at 04/16/17 1158  . cinacalcet (SENSIPAR) tablet 90 mg  90 mg Oral Q breakfast Bonnell Public Tublu, MD   90 mg at 04/16/17 1157  . gabapentin (NEURONTIN) capsule 100 mg  100 mg Oral QHS Bonnell Public Tublu, MD      . heparin ADULT infusion 100 units/mL (25000 units/230mL sodium chloride 0.45%)  1,500 Units/hr Intravenous Continuous Jalene Mullet, RPH 15 mL/hr at 04/16/17 1159 1,500 Units/hr at 04/16/17 1159  . heparin injection 1,000 Units  1,000 Units Dialysis PRN Penninger, Ria Comment, PA      . heparin injection 4,000 Units  4,000 Units Dialysis PRN Penninger, Ria Comment, PA      .  iopamidol (ISOVUE-300) 61 % injection           . lidocaine (PF) (XYLOCAINE) 1 % injection 5 mL  5 mL Intradermal PRN Penninger, Ria Comment, PA      . lidocaine-prilocaine (EMLA) cream 1 application  1 application Topical PRN Penninger, Ria Comment, PA      . meropenem (MERREM) 500 mg in sodium chloride 0.9 % 100 mL IVPB  500 mg Intravenous Q24H Jalene Mullet, RPH 200 mL/hr at 04/16/17 1219 500 mg at 04/16/17 1219  . multivitamin (RENA-VIT) tablet 1 tablet  1 tablet Oral Daily Vashti Hey, MD   1 tablet at 04/16/17 1158  . mupirocin ointment (BACTROBAN) 2 % 1 application  1 application  Topical BID PRN Bonnell Public Tublu, MD      . ondansetron [facility]) tablet 4 mg  4 mg Oral Q6H PRN Vashti Hey, MD       Or  . ondansetron [facility]) injection 4 mg  4 mg Intravenous Q6H PRN Vashti Hey, MD   4 mg at 04/16/17 1237  . pentafluoroprop-tetrafluoroeth (GEBAUERS) aerosol 1 application  1 application Topical PRN Penninger, Ria Comment, PA      . polyethylene glycol (MIRALAX / GLYCOLAX) packet 17 g  17 g Oral Daily PRN Bonnell Public Tublu, MD      . sertraline (ZOLOFT) tablet 50 mg  50 mg Oral QHS Bonnell Public Tublu, MD      . sucroferric oxyhydroxide [facility]) chewable tablet 500 mg  500 mg Oral TID WC Vashti Hey, MD   Stopped at 04/16/17 1240  . vancomycin (VANCOCIN) IVPB 1000 mg/200 mL premix  1,000 mg Intravenous Q T,Th,Sa-HD Jamse Arn Kyra Searles, MD       Current Outpatient Medications  Medication Sig Dispense Refill  . acetaminophen (TYLENOL) 500 MG tablet Take 1,000 mg by mouth every 6 (six) hours as needed for mild pain or headache.     Marland Kitchen apixaban (ELIQUIS) 5 MG TABS tablet Take 5 mg by mouth 2 (two) times daily.    . cinacalcet (SENSIPAR) 90 MG tablet Take 90 mg by mouth daily.    Marland Kitchen gabapentin (NEURONTIN) 100 MG capsule Take 100 mg by mouth at bedtime.    . multivitamin (RENA-VIT) TABS tablet Take 1 tablet by mouth daily.     . mupirocin ointment (BACTROBAN) 2 % Apply 1 application topically 2 (two) times daily as needed (rash).     . promethazine (PHENERGAN) 25 MG tablet Take 25 mg by mouth every 6 (six) hours as needed for nausea or vomiting.    . sertraline (ZOLOFT) 50 MG tablet Take 50 mg by mouth at bedtime.     . sucroferric oxyhydroxide (VELPHORO) 500 MG chewable tablet Chew 500 mg by mouth 3 (three) times daily with meals.     Labs: Basic Metabolic Panel: Recent Labs  Lab 04/15/17 2215  NA 138  K 3.6  CL 99*  CO2 22  GLUCOSE 111*  BUN 30*  CREATININE 7.58*  CALCIUM 9.7   Liver Function Tests: Recent Labs  Lab 04/16/17 0442  AST 17  ALT 12*  ALKPHOS 55  BILITOT 0.8  PROT 7.7  ALBUMIN 3.5   Recent Labs  Lab 04/15/17 2215  LIPASE 44   CBC: Recent Labs  Lab 04/15/17 2215  WBC 10.5  HGB 10.8*  HCT 33.7*  MCV 96.8  PLT 225   Studies/Results: Dg Chest 2 View  Result Date: 04/15/2017 CLINICAL DATA:  Chest pain.  Shortness of breath today. EXAM: CHEST - 2 VIEW COMPARISON:  None. FINDINGS: Right internal jugular dialysis catheter with tip at the atrial caval junction. Vascular stents in the right axilla.The cardiomediastinal contours are normal. The lungs are clear. Pulmonary vasculature is normal. No consolidation, pleural effusion, or pneumothorax. No acute osseous abnormalities are seen. IMPRESSION: 1. No acute findings. 2. Right dialysis catheter with tip at the atrial caval junction. Electronically Signed   By: Jeb Levering M.D.   On: 04/15/2017 22:43   Ct Abdomen Pelvis W Contrast  Result Date: 04/16/2017 CLINICAL DATA:  52 year old female with epigastric pain nausea and vomiting beginning yesterday morning. Chronic dialysis patient. EXAM: CT ABDOMEN AND PELVIS WITH CONTRAST TECHNIQUE: Multidetector CT imaging of the abdomen and  pelvis was performed using the standard protocol following bolus administration of intravenous contrast. CONTRAST:  139mL ISOVUE-300 IOPAMIDOL  (ISOVUE-300) INJECTION 61% COMPARISON:  CT Abdomen and Pelvis 12/05/2016 FINDINGS: Lower chest: Stable cardiomegaly. Catheter tips project in the right atrium and ventricle. No pericardial effusion. Stable lung bases, mild atelectasis greater on the left. No pleural effusion. Hepatobiliary: Negative liver and gallbladder. Pancreas: Negative. Spleen: Negative. Adrenals/Urinary Tract: Adrenal glands remain normal. Complete chronic atrophy of the left renal parenchyma with residual round, dilated left renal collecting system and ureter. Chronic right nephrostomy tube remains in place and appears stable in configuration. However, there is new right perinephric inflammatory stranding and heterogeneously decreased enhancement and contrast excretion from the right kidney on the early and delayed postcontrast images. There is trace gas within the right renal collecting system. There is no right hydronephrosis. The right ureter is decompressed. Both ureters are thought to course to an ileal conduit which is thought to be visible exiting the ventral abdominal wall on sagittal image 51. The right ureter can be traced to this structure. The distal left ureter is obscured by bowel in the ventral abdomen. No definite urinary bladder is identified. Stomach/Bowel: Negative distal colon. The descending colon is decompressed. There is a chronic large up to 17 centimeter diameter ventral abdominal wall hernia with the 4-5 centimeter hernia neck containing transverse colon and mesentery. This appears stable with no acute inflammation. Along the left superior margin of this hernia is a smaller chronic fat/mesentery only containing hernia which is stable on series 3, image 37. The right colon contains a small volume of fluid. The cecum is located in the midline or slightly to the left of midline in the lower abdomen and anterior pelvis. Negative terminal ileum. The appendix is diminutive or absent. No pericecal inflammation. No dilated  small bowel. There is a small bowel to small bowel anastomosis in the right abdomen on series 3, image 64 which is stable and without adverse features. Negative stomach and duodenum. No abdominal free air or free fluid. Vascular/Lymphatic: Aortoiliac calcified atherosclerosis. Suboptimal intravascular contrast timing but the major arterial structures in the abdomen and pelvis appear patent. The portal venous system is patent. There is a right femoral region dialysis graft in place which is new since 2018. There is an adjacent round 2.8 centimeter simple fluid density probable postoperative seroma (series 3, image 91). There are chronic venous collaterals along the left Abdo and in the suprapubic subcutaneous fat. No lymphadenopathy. Reproductive: Negative. Other: Confluent presacral stranding is stable since December. No pelvic free fluid. Musculoskeletal: No acute osseous abnormality identified. IMPRESSION: 1. Chronic right nephrostomy tube with new right kidney heterogeneous enhancement and perinephric inflammation compared to December 2018. Consider Acute Pyelonephritis. Lack of contrast excretion on the right kidney now suggests progressed right renal insufficiency. 2. Chronic complete left renal atrophy. 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 3. A right femoral vessel dialysis graft has been placed since 2018. A 2.8 centimeter postoperative seroma is adjacent. 4. Chronic pelvic fibrosis, superficial pelvic venous collaterals, Aortic Atherosclerosis (ICD10-I70.0), large ventral abdominal wall hernia containing the transverse colon, and other abdominal and pelvic findings are stable since December. Electronically Signed   By: Genevie Ann M.D.   On: 04/16/2017 07:44    ROS: All others negative except those listed in HPI.  Physical Exam: Vitals:   04/16/17 1000 04/16/17 1100 04/16/17 1247 04/16/17 1247  BP: (!) 103/55   103/62  Pulse: 64  63 63  Resp: 18  14 14    Temp:      TempSrc:      SpO2: 95%  99% 99%  Weight:  113.9 kg (251 lb 1.7 oz)    Height:  5\' 7"  (1.702 m)       General: WDWN, NAD, pleasant female Head: NCAT, sclera not icteric, MMM Neck: Supple. No lymphadenopathy. No JVD. Lungs: CTA bilaterally. No wheeze, rales or rhonchi. Breathing is unlabored. Heart: RRR. No murmur, rubs or gallops.  Abdomen: +non healing urostomy in LLQ, +soft, nontender, +BS, no guarding, no rebound tenderness Lower extremities:no edema, ischemic changes, or open wounds  Neuro: AAOx3. Moves all extremities spontaneously. Psych:  Responds to questions appropriately with a normal affect. Dialysis Access: R IJ TDC +tenderness, mild erythema surrounding, no drainage  Dialysis Orders:  TTS - [facility]  4hrs, BFR 400, DFR 800,  EDW113kg, 2K/ 2Ca   Access: R IJ TDC  Heparin 4000 Unit bolus IV & intermittent 500 Unit IV qhr Venofer 50mg  IV qwk mircera 113mcg IV q2wks - last 4/11 Calcitriol 0.62mcg PO qHD  Last Labs: 4/11: Hgb10.2, P 7.8, Ca 8.4, 3/28: TSAT 31%, K 4.3, PTH 846, Albumin 4.2  Assessment/Plan: 1.  Pyelonephritis - UA & CT concerning for pyelo. On ABX. IR consulted, no drain exchange planned for now.    2.  ESRD -  HD due today.  Plan to continue on TTS per regular schedule via TDC. 3.  Hypertension/volume  - BP mostly low. Appears dry on exam.  Plan to run even today. 4.  Anemia of chronic disease- Hgb 10.8, ESA recently dosed. Follow 5.  Secondary Hyperparathyroidism -  Ca 9.7, OP P high, continue binders, VDRA.  Recheck P. 6.  Nutrition - Alb 3.5, renal diet with fluid restrictions. Renavite.  7. VTE - chronic anticoagulation - per primary  Jen Mow, PA-C [facility] Pager: [phone] 04/16/2017, 1:08 PM   Pt seen, examined and agree w A/P as above.  Kelly Splinter MD [employer] pager [phone]   04/16/2017, 4:12 PM

## 2017-04-16 NOTE — Progress Notes (Signed)
Patient arrived to unit by ED stretcher.  Reviewed treatment plan and this RN agrees with plan.  Report received from ED RN, Jenny Reichmann.  Consent obtained.  Patient A & O X 4.   Lung sounds diminished to ausculation in all fields. Generalized edema. Cardiac:  NSR.  Removed caps and cleansed RIJ catheter with chlorhedxidine.  Aspirated ports of heparin and flushed them with saline per protocol.  Connected and secured lines, initiated treatment at 1323.  UF Goal of 500 mL and net fluid removal 0 L.  Will continue to monitor.

## 2017-04-16 NOTE — Progress Notes (Signed)
[place] for heparin Indication: history of multiple DVTs   Allergies  Allergen Reactions  . Ciprofloxacin Other (See Comments)    IV only-burning to her veins, can take PO  . Erythromycin Anaphylaxis  . Chlorhexidine Rash  . Other Rash and Other (See Comments)    chloraprep    Patient Measurements: Height: 5\' 7"  (170.2 cm) Weight: 254 lb 13.6 oz (115.6 kg) IBW/kg (Calculated) : 61.6 Heparin Dosing Weight: 88.1kg   Vital Signs: Temp: 100.3 F (37.9 C) (04/13 2051) Temp Source: Oral (04/13 2051) BP: 98/57 (04/13 2051) Pulse Rate: 88 (04/13 2051)  Labs: Recent Labs    04/15/17 2215 04/16/17 1425 04/16/17 1426 04/16/17 2014  HGB 10.8*  --  8.7*  --   HCT 33.7*  --  27.9*  --   PLT 225  --  190  --   APTT  --   --   --  41*  HEPARINUNFRC  --   --   --  0.41  CREATININE 7.58* 8.29*  --   --     Estimated Creatinine Clearance: 10.4 mL/min (A) (by C-G formula based on SCr of 8.29 mg/dL (H)).   Medical History: Past Medical History:  Diagnosis Date  . Anemia   . Anxiety   . Cervical cancer (HCC)    Cervical and ovarian  . DVT (deep venous thrombosis) ([place])   . History of kidney stones   . Hypertension    treated years ago, on no medications for at least years ( as of 2019)  . Renal disorder    ESRD - stage 5 Dialysis T/Th/Sa  . Renal insufficiency     Assessment: 52 y.o. female presented to the ED with N/V/D and fever at home. Patient has a history of multiple DVTs and is on eliquis 5mg  PO BID at home. Last reported dose was on 04/15/2017 at 2000. Pharmacy consulted to change to heparin as patient with need procedure on this admission.   Initial aptt is subtherapeutic at 41. Suspect heparin level is being influenced by apixaban that patient was on PTA.   Goal of Therapy:  APTT 60-102s Heparin level 0.3-0.7 units/ml Monitor platelets by anticoagulation protocol: Yes   Plan:  1. Increase heparin to 1700  units/hr 2. Check anti-Xa level in 8 hours and daily while on heparin 3. Continue to monitor H&H and platelets  4. Check APTT until correlates with HL  5. Monitor for S/Sx of bleeding   Vincenza Hews, PharmD, BCPS 04/16/2017, 9:13 PM

## 2017-04-16 NOTE — Progress Notes (Signed)
ANTICOAGULATION CONSULT NOTE - Initial Consult  Pharmacy Consult for heparin Indication: history of multiple DVTs   Allergies  Allergen Reactions  . Ciprofloxacin Other (See Comments)    IV only-burning to her veins, can take PO  . Erythromycin Anaphylaxis  . Chlorhexidine Rash  . Other Rash and Other (See Comments)    chloraprep    Patient Measurements:   Heparin Dosing Weight: 88.1kg   Vital Signs: Temp: 99.4 F (37.4 C) (04/13 0517) Temp Source: Rectal (04/13 0517) BP: 103/55 (04/13 1000) Pulse Rate: 64 (04/13 1000)  Labs: Recent Labs    04/15/17 2215  HGB 10.8*  HCT 33.7*  PLT 225  CREATININE 7.58*    Estimated Creatinine Clearance: 11.3 mL/min (A) (by C-G formula based on SCr of 7.58 mg/dL (H)).   Medical History: Past Medical History:  Diagnosis Date  . Anemia   . Anxiety   . Cervical cancer (HCC)    Cervical and ovarian  . DVT (deep venous thrombosis) ([place])   . History of kidney stones   . Hypertension    treated years ago, on no medications for at least years ( as of 2019)  . Renal disorder    ESRD - stage 5 Dialysis T/Th/Sa  . Renal insufficiency     Assessment: 52 y.o. female presented to the ED with N/V/D and fever at home. Patient has a history of multiple DVTs and is on eliquis 5mg  PO BID at home. Last reported dose was on 04/15/2017 at 2000. Pharmacy consulted to change to heparin as patient with need procedure on this admission.   Goal of Therapy:  APTT 60-102s Heparin level 0.3-0.7 units/ml Monitor platelets by anticoagulation protocol: Yes   Plan:  Start heparin infusion at 1500 units/hr Check anti-Xa level in 8 hours and daily while on heparin Continue to monitor H&H and platelets  Check APTT until correlates with HL  Monitor for S/Sx of bleeding    Jalene Mullet, Pharm.D. PGY1 Pharmacy Resident 04/16/2017 11:30 AM Main Pharmacy: [phone]

## 2017-04-16 NOTE — Progress Notes (Signed)
Pharmacy Antibiotic Note  Judy Kelley is a 52 y.o. female admitted on 04/15/2017 with sepsis.  Pharmacy has been consulted for Vancomycin and meropenem dosing for pyelonephritis with recent nephrostomy changeout 3 weeks ago.  WBC WNL at 10.5. Patient reports being febrile up to 102 at home, has been afebrile in the ED. Patient is ESRD on HD Tues/Thurs/Sat, no note of missing sessions.   Plan: -Vancomycin 2000 mg IV x 1, then 1000 mg IV qHD Tues/Thurs/Sat -Zosyn 3.375g IV x 1 given in the ED -Meropenem 500mg  IV q24  -Trend WBC, temp, HD schedule -F/U infectious work-up -Drug levels as indicated  Temp (24hrs), Avg:99.2 F (37.3 C), Min:98.8 F (37.1 C), Max:99.5 F (37.5 C)  Recent Labs  Lab 04/15/17 2215 04/16/17 0529  WBC 10.5  --   CREATININE 7.58*  --   LATICACIDVEN  --  1.27    Estimated Creatinine Clearance: 11.3 mL/min (A) (by C-G formula based on SCr of 7.58 mg/dL (H)).    Allergies  Allergen Reactions  . Ciprofloxacin Other (See Comments)    IV only-burning to her veins, can take PO  . Erythromycin Anaphylaxis  . Chlorhexidine Rash  . Other Rash and Other (See Comments)    chloraprep     Jalene Mullet, Pharm.D. PGY1 Pharmacy Resident 04/16/2017 10:04 AM Main Pharmacy: [phone]

## 2017-04-17 ENCOUNTER — Inpatient Hospital Stay (HOSPITAL_COMMUNITY): Payer: Medicare Other

## 2017-04-17 LAB — CBC
HCT: 28.2 % — ABNORMAL LOW (ref 36.0–46.0)
HEMOGLOBIN: 8.9 g/dL — AB (ref 12.0–15.0)
MCH: 31.3 pg (ref 26.0–34.0)
MCHC: 31.6 g/dL (ref 30.0–36.0)
MCV: 99.3 fL (ref 78.0–100.0)
Platelets: 196 10*3/uL (ref 150–400)
RBC: 2.84 MIL/uL — AB (ref 3.87–5.11)
RDW: 15.2 % (ref 11.5–15.5)
WBC: 6.4 10*3/uL (ref 4.0–10.5)

## 2017-04-17 LAB — BASIC METABOLIC PANEL
ANION GAP: 14 (ref 5–15)
BUN: 16 mg/dL (ref 6–20)
CALCIUM: 7.8 mg/dL — AB (ref 8.9–10.3)
CO2: 22 mmol/L (ref 22–32)
Chloride: 101 mmol/L (ref 101–111)
Creatinine, Ser: 5.27 mg/dL — ABNORMAL HIGH (ref 0.44–1.00)
GFR, EST AFRICAN AMERICAN: 10 mL/min — AB (ref >60)
GFR, EST NON AFRICAN AMERICAN: 9 mL/min — AB (ref >60)
Glucose, Bld: 99 mg/dL (ref 65–99)
Potassium: 3.5 mmol/L (ref 3.5–5.1)
Sodium: 137 mmol/L (ref 135–145)

## 2017-04-17 LAB — HEPARIN LEVEL (UNFRACTIONATED)
HEPARIN UNFRACTIONATED: 0.16 [IU]/mL — AB (ref 0.30–0.70)
Heparin Unfractionated: 0.31 IU/mL (ref 0.30–0.70)

## 2017-04-17 LAB — C DIFFICILE QUICK SCREEN W PCR REFLEX
C DIFFICILE (CDIFF) INTERP: NOT DETECTED
C DIFFICILE (CDIFF) TOXIN: NEGATIVE
C Diff antigen: NEGATIVE

## 2017-04-17 LAB — APTT
APTT: 49 s — AB (ref 24–36)
aPTT: 83 seconds — ABNORMAL HIGH (ref 24–36)

## 2017-04-17 MED ORDER — PANTOPRAZOLE SODIUM 40 MG IV SOLR
40.0000 mg | INTRAVENOUS | Status: DC
  Administered 2017-04-17: 40 mg via INTRAVENOUS
  Filled 2017-04-17: qty 40

## 2017-04-17 MED ORDER — VANCOMYCIN HCL IN DEXTROSE 1-5 GM/200ML-% IV SOLN
1000.0000 mg | Freq: Once | INTRAVENOUS | Status: DC
  Filled 2017-04-17: qty 200

## 2017-04-17 MED ORDER — SODIUM CHLORIDE 0.9 % IV BOLUS
500.0000 mL | Freq: Once | INTRAVENOUS | Status: AC
  Administered 2017-04-17: 500 mL via INTRAVENOUS

## 2017-04-17 MED ORDER — PROMETHAZINE HCL 25 MG/ML IJ SOLN
12.5000 mg | Freq: Four times a day (QID) | INTRAMUSCULAR | Status: DC | PRN
  Administered 2017-04-17 – 2017-04-22 (×8): 12.5 mg via INTRAVENOUS
  Filled 2017-04-17 (×8): qty 1

## 2017-04-17 MED ORDER — VANCOMYCIN HCL IN DEXTROSE 1-5 GM/200ML-% IV SOLN
1000.0000 mg | Freq: Once | INTRAVENOUS | Status: AC
  Administered 2017-04-17: 1000 mg via INTRAVENOUS
  Filled 2017-04-17: qty 200

## 2017-04-17 NOTE — Progress Notes (Signed)
ANTICOAGULATION and ANTIBIOTIC CONSULT NOTE  Pharmacy Consult for heparin; vancomycin + meropenem Indication: history of multiple DVTs; pyelonephritis w/ nephrostomy tube  Allergies  Allergen Reactions  . Ciprofloxacin Other (See Comments)    IV only-burning to her veins, can take PO  . Erythromycin Anaphylaxis  . Chlorhexidine Rash  . Other Rash and Other (See Comments)    chloraprep    Patient Measurements: Height: 5\' 7"  (170.2 cm) Weight: 254 lb 13.6 oz (115.6 kg) IBW/kg (Calculated) : 61.6 Heparin Dosing Weight: 88.1kg   Vital Signs: Temp: 97.9 F (36.6 C) (04/14 1330) Temp Source: Oral (04/14 1330) BP: 122/67 (04/14 1330) Pulse Rate: 57 (04/14 1330)  Labs: Recent Labs    04/15/17 2215 04/16/17 1425 04/16/17 1426 04/16/17 2014 04/17/17 0648 04/17/17 1705  HGB 10.8*  --  8.7*  --  8.9*  --   HCT 33.7*  --  27.9*  --  28.2*  --   PLT 225  --  190  --  196  --   APTT  --   --   --  41* 49* 83*  HEPARINUNFRC  --   --   --  0.41 0.16* 0.31  CREATININE 7.58* 8.29*  --   --  5.27*  --     Estimated Creatinine Clearance: 16.4 mL/min (A) (by C-G formula based on SCr of 5.27 mg/dL (H)).   Medical History: Past Medical History:  Diagnosis Date  . Anemia   . Anxiety   . Cervical cancer (HCC)    Cervical and ovarian  . DVT (deep venous thrombosis) ([place])   . History of kidney stones   . Hypertension    treated years ago, on no medications for at least years ( as of 2019)  . Renal disorder    ESRD - stage 5 Dialysis T/Th/Sa  . Renal insufficiency     Assessment: 52 y.o. female presented to the ED with N/V/D and fever at home. Patient has a history of multiple DVTs and is on eliquis 5mg  PO BID at home. Last reported dose was on 04/15/2017 at 2000. Pharmacy consulted to change to heparin as patient with need procedure on this admission.   APTT 83, antiXa level 0.31 therapeutic on 1950 units/hr  Goal of Therapy:  APTT 60-102 sec Heparin level 0.3-0.7  units/ml Monitor platelets by anticoagulation protocol: Yes   Plan: Increase heparin slightly to 2000 units/hr F/u AM HL, and CBC while on therapy.    Maryanna Shape, PharmD, BCPS  Clinical Pharmacist  Pager: [phone]  04/17/2017, 6:16 PM

## 2017-04-17 NOTE — Progress Notes (Addendum)
[facility] Progress Note   Subjective:   Continued to have diarrhea overnight. Nausea improved w/Zofran. Some SOB.   Objective Vitals:   04/16/17 1725 04/16/17 1746 04/16/17 2051 04/17/17 0502  BP: (!) 120/50 (!) 74/62 (!) 98/57 92/60  Pulse: 83 74 88 71  Resp:  16 17 16   Temp:  99.2 F (37.3 C) 100.3 F (37.9 C) 98.3 F (36.8 C)  TempSrc:  Oral Oral Oral  SpO2:  100% 97% 99%  Weight:      Height:       Physical Exam General:NAD, obese, pale female laying in bed Heart:RRR, no mrg Lungs:CTAB, no Wheeze, rales or rhonchi Abdomen:soft, ND, diffuse tenderness, +BS Extremities:no edema Dialysis Access: R IJ TDC +tenderness/mild erythema. L thigh AVG no b/t   Filed Weights   04/16/17 1100 04/16/17 1312 04/16/17 1723  Weight: 113.9 kg (251 lb 1.7 oz) 115.6 kg (254 lb 13.6 oz) 115.6 kg (254 lb 13.6 oz)    Intake/Output Summary (Last 24 hours) at 04/17/2017 0937 Last data filed at 04/17/2017 0510 Gross per 24 hour  Intake 850 ml  Output 250 ml  Net 600 ml    Additional Objective Labs: Basic Metabolic Panel: Recent Labs  Lab 04/15/17 2215 04/16/17 1425 04/17/17 0648  NA 138 136 137  K 3.6 3.8 3.5  CL 99* 101 101  CO2 22 22 22   GLUCOSE 111* 84 99  BUN 30* 38* 16  CREATININE 7.58* 8.29* 5.27*  CALCIUM 9.7 8.6* 7.8*  PHOS  --  7.5*  --    Liver Function Tests: Recent Labs  Lab 04/16/17 0442 04/16/17 1425  AST 17  --   ALT 12*  --   ALKPHOS 55  --   BILITOT 0.8  --   PROT 7.7  --   ALBUMIN 3.5 2.8*   Recent Labs  Lab 04/15/17 2215  LIPASE 44   CBC: Recent Labs  Lab 04/15/17 2215 04/16/17 1426 04/17/17 0648  WBC 10.5 7.8 6.4  HGB 10.8* 8.7* 8.9*  HCT 33.7* 27.9* 28.2*  MCV 96.8 97.9 99.3  PLT 225 190 196   Blood Culture    Component Value Date/Time   SDES URINE, CATHETERIZED 04/16/2017 0520   SPECREQUEST  04/16/2017 0520    NONE Performed at [facility], [place] [address], [place], [place] [address]    CULT >=100,000  COLONIES/mL GRAM NEGATIVE RODS (A) 04/16/2017 0520   REPTSTATUS PENDING 04/16/2017 0520   Studies/Results: Dg Chest 2 View  Result Date: 04/15/2017 CLINICAL DATA:  Chest pain.  Shortness of breath today. EXAM: CHEST - 2 VIEW COMPARISON:  None. FINDINGS: Right internal jugular dialysis catheter with tip at the atrial caval junction. Vascular stents in the right axilla.The cardiomediastinal contours are normal. The lungs are clear. Pulmonary vasculature is normal. No consolidation, pleural effusion, or pneumothorax. No acute osseous abnormalities are seen. IMPRESSION: 1. No acute findings. 2. Right dialysis catheter with tip at the atrial caval junction. Electronically Signed   By: Jeb Levering M.D.   On: 04/15/2017 22:43   Ct Abdomen Pelvis W Contrast  Result Date: 04/16/2017 CLINICAL DATA:  52 year old female with epigastric pain nausea and vomiting beginning yesterday morning. Chronic dialysis patient. EXAM: CT ABDOMEN AND PELVIS WITH CONTRAST TECHNIQUE: Multidetector CT imaging of the abdomen and pelvis was performed using the standard protocol following bolus administration of intravenous contrast. CONTRAST:  122mL ISOVUE-300 IOPAMIDOL (ISOVUE-300) INJECTION 61% COMPARISON:  CT Abdomen and Pelvis 12/05/2016 FINDINGS: Lower chest: Stable cardiomegaly. Catheter  tips project in the right atrium and ventricle. No pericardial effusion. Stable lung bases, mild atelectasis greater on the left. No pleural effusion. Hepatobiliary: Negative liver and gallbladder. Pancreas: Negative. Spleen: Negative. Adrenals/Urinary Tract: Adrenal glands remain normal. Complete chronic atrophy of the left renal parenchyma with residual round, dilated left renal collecting system and ureter. Chronic right nephrostomy tube remains in place and appears stable in configuration. However, there is new right perinephric inflammatory stranding and heterogeneously decreased enhancement and contrast excretion from the right kidney on  the early and delayed postcontrast images. There is trace gas within the right renal collecting system. There is no right hydronephrosis. The right ureter is decompressed. Both ureters are thought to course to an ileal conduit which is thought to be visible exiting the ventral abdominal wall on sagittal image 51. The right ureter can be traced to this structure. The distal left ureter is obscured by bowel in the ventral abdomen. No definite urinary bladder is identified. Stomach/Bowel: Negative distal colon. The descending colon is decompressed. There is a chronic large up to 17 centimeter diameter ventral abdominal wall hernia with the 4-5 centimeter hernia neck containing transverse colon and mesentery. This appears stable with no acute inflammation. Along the left superior margin of this hernia is a smaller chronic fat/mesentery only containing hernia which is stable on series 3, image 37. The right colon contains a small volume of fluid. The cecum is located in the midline or slightly to the left of midline in the lower abdomen and anterior pelvis. Negative terminal ileum. The appendix is diminutive or absent. No pericecal inflammation. No dilated small bowel. There is a small bowel to small bowel anastomosis in the right abdomen on series 3, image 64 which is stable and without adverse features. Negative stomach and duodenum. No abdominal free air or free fluid. Vascular/Lymphatic: Aortoiliac calcified atherosclerosis. Suboptimal intravascular contrast timing but the major arterial structures in the abdomen and pelvis appear patent. The portal venous system is patent. There is a right femoral region dialysis graft in place which is new since 2018. There is an adjacent round 2.8 centimeter simple fluid density probable postoperative seroma (series 3, image 91). There are chronic venous collaterals along the left Abdo and in the suprapubic subcutaneous fat. No lymphadenopathy. Reproductive: Negative. Other:  Confluent presacral stranding is stable since December. No pelvic free fluid. Musculoskeletal: No acute osseous abnormality identified. IMPRESSION: 1. Chronic right nephrostomy tube with new right kidney heterogeneous enhancement and perinephric inflammation compared to December 2018. Consider Acute Pyelonephritis. Lack of contrast excretion on the right kidney now suggests progressed right renal insufficiency. 2. Chronic complete left renal atrophy. 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 3. A right femoral vessel dialysis graft has been placed since 2018. A 2.8 centimeter postoperative seroma is adjacent. 4. Chronic pelvic fibrosis, superficial pelvic venous collaterals, Aortic Atherosclerosis (ICD10-I70.0), large ventral abdominal wall hernia containing the transverse colon, and other abdominal and pelvic findings are stable since December. Electronically Signed   By: Genevie Ann M.D.   On: 04/16/2017 07:44    Medications: . heparin 1,700 Units/hr (04/17/17 0400)  . meropenem (MERREM) IV 500 mg (04/16/17 1219)  . vancomycin    . vancomycin     . calcitRIOL  0.75 mcg Oral Daily  . cinacalcet  90 mg Oral Q breakfast  . gabapentin  100 mg Oral QHS  . multivitamin  1 tablet Oral Daily  . sertraline  50 mg Oral  QHS  . sucroferric oxyhydroxide  500 mg Oral TID WC    Dialysis Orders: TTS - [facility]  4hrs, BFR 400, DFR 800,  EDW113kg, 2K/ 2Ca   Access: R IJ TDC  Heparin 4000 Unit bolus IV & intermittent 500 Unit IV qhr Venofer 50mg  IV qwk mircera 163mcg IV q2wks - last 4/11 Calcitriol 0.3mcg PO qHD  Last Labs: 4/11: Hgb10.2, P 7.8, Ca 8.4, 3/28: TSAT 31%, K 4.3, PTH 846, Albumin 4.2   Assessment/Plan: 1.  Pyelonephritis - UA & CT concerning for pyelo. UCx +gram neg rods. On ABX. IR consulted, no drain exchange planned for now.    2. Diarrhea - C diff negative. Per primary 3. Pain at HD cath site - no drainage. BC pending. Placed in early March.  Not grossly infected, await blood cx results.  4. ESRD - Continue per regular TTS schedule. Over EDW, c/o SOB, appears euvolemic on exam, no indication for urgent treatment. Reassess in AM to see if extra HD needed.  5.  Hypotension/volume  - BP mostly low.  May need to add midodrine with HD. Over EDW, appears euvolemic on exam. Will reassess volume status in AM to see if extra HD needed.   6.  Anemia of chronic disease- Hgb 10.8, ESA recently dosed. Follow 7.  Secondary Hyperparathyroidism -  CCa 8.7, P7.5, continue binders, VDRA.  8.  Nutrition - Alb 2.8, renal diet with fluid restrictions. Renavite. Add Nepro 9.  VTE - chronic anticoagulation - per primary   Jen Mow, PA-C [facility] Pager: [phone] 04/17/2017,9:37 AM  LOS: 1 day   Pt seen, examined and agree w A/P as above. SOB today, will get CXR.   Kelly Splinter MD [employer] pager [phone]   04/17/2017, 10:52 AM

## 2017-04-17 NOTE — Plan of Care (Signed)
  Problem: Nutrition: Goal: Adequate nutrition will be maintained Outcome: Progressing   Problem: Elimination: Goal: Will not experience complications related to bowel motility Outcome: Progressing   Problem: Pain Managment: Goal: General experience of comfort will improve Outcome: Progressing   

## 2017-04-17 NOTE — Progress Notes (Signed)
PROGRESS NOTE    Mc Hollen  BHA:[id number] DOB: Sep 22, 1965 DOA: 04/15/2017 PCP: System, Pcp Not In    Brief Narrative: Judy Kelley is an 52 y.o. 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 Patient underwent change out of her nephrostomy tube 3 days ago at [facility] and did well until yesterday when she noted onset of abdominal pain, nausea, vomiting, diarrhea and fevers. Patient states that she's had diarrhea 6 and vomiting 5 last episode 8:30 last night, no blood. Patient is due for dialysis today. Patient denies chest pain or shortness of breath. Patient admits to fevers and chills.   ED Course:  The patient was noted to have an abnormal urine, was started on vancomycin and Zosyn and abdominal CT was done which showed acute pyelonephritis. Patient is admitted for further workup. Renal has been consult did and is planning on dialysis this morning.   Assessment & Plan:   Principal Problem:   Pyelonephritis Active Problems:   ESRD on dialysis ([place])   Chronic anticoagulation   History of adverse effect of venous thromboembolism (VTE) prophylaxis   Depression   1-Pyelonephritis;  Has nephrostomy tube change 3 days prior to admission.  UA with too numerous to count WBC>  CT abdomen; Chronic right nephrostomy tube with new right kidney heterogeneous enhancement and perinephric inflammation compared to December 2018. Consider Acute Pyelonephritis. Lack of contrast excretion on the right kidney now suggests progressed  right renal insufficiency. 2. Chronic complete left renal atrophy. 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 3. A right femoral vessel dialysis graft has been placed since 2018. A 2.8 centimeter postoperative seroma is adjacent. Continue with IV meropenem; discontinue vancomycin.  UA growing E coli. 100,000. Blood culture no growth  Evaluated by IR 4-13. No plan to exchange nephrostomy tube.  If patient does not improved, might need to consult IR again.   2-ESRD; HD Nephrology following.  Had HD 4-13  3-VTE; on heparin. Might be to transition back to eliquis soon.   Depression; continue with Zoloft.   Nausea, vomiting abdominal pain, diarrhea.  C diff negative.  Phenergan help with nausea.  CT abdomen only showed pyelonephritis.  Support care.           DVT prophylaxis: IV heparin.  Code Status: full code.  Family Communication: care discussed with patient.  Disposition Plan: home when stable.    Consultants:  Nephrology IR  Procedures:  None   Antimicrobials:   primaxin      Subjective: She is complaining of abdominal pain,. Nausea and vomiting.    Objective: Vitals:   04/16/17 1725 04/16/17 1746 04/16/17 2051 04/17/17 0502  BP: (!) 120/50 (!) 74/62 (!) 98/57 92/60  Pulse: 83 74 88 71  Resp:  16 17 16   Temp:  99.2 F (37.3 C) 100.3 F (37.9 C) 98.3 F (36.8 C)  TempSrc:  Oral Oral Oral  SpO2:  100% 97% 99%  Weight:      Height:        Intake/Output Summary (Last 24 hours) at 04/17/2017 0741 Last data filed  at 04/17/2017 0510 Gross per 24 hour  Intake 1850 ml  Output 250 ml  Net 1600 ml   Filed Weights   04/16/17 1100 04/16/17 1312 04/16/17 1723  Weight: 113.9 kg (251 lb 1.7 oz) 115.6 kg (254 lb 13.6 oz) 115.6 kg (254 lb 13.6 oz)    Examination:  General exam: Appears calm and comfortable  Respiratory system: Clear to auscultation. Respiratory effort normal. Cardiovascular system: S1 &  S2 heard, RRR. No JVD, murmurs, rubs, gallops or clicks. No pedal edema. Gastrointestinal system: Abdomen is nondistended, soft and mild tenderness. . No organomegaly or masses felt. Normal bowel sounds heard. Central nervous system: Alert and oriented. No focal neurological deficits. Extremities: Symmetric 5 x 5 power. Skin: No rashes, lesions or ulcers    Data Reviewed: I have personally reviewed following labs and imaging studies  CBC: Recent Labs  Lab 04/15/17 2215 04/16/17 1426  WBC 10.5 7.8  HGB 10.8* 8.7*  HCT 33.7* 27.9*  MCV 96.8 97.9  PLT 225 263   Basic Metabolic Panel: Recent Labs  Lab 04/15/17 2215 04/16/17 1425  NA 138 136  K 3.6 3.8  CL 99* 101  CO2 22 22  GLUCOSE 111* 84  BUN 30* 38*  CREATININE 7.58* 8.29*  CALCIUM 9.7 8.6*  PHOS  --  7.5*   GFR: Estimated Creatinine Clearance: 10.4 mL/min (A) (by C-G formula based on SCr of 8.29 mg/dL (H)). Liver Function Tests: Recent Labs  Lab 04/16/17 0442 04/16/17 1425  AST 17  --   ALT 12*  --   ALKPHOS 55  --   BILITOT 0.8  --   PROT 7.7  --   ALBUMIN 3.5 2.8*   Recent Labs  Lab 04/15/17 2215  LIPASE 44   No results for input(s): AMMONIA in the last 168 hours. Coagulation Profile: No results for input(s): INR, PROTIME in the last 168 hours. Cardiac Enzymes: No results for input(s): CKTOTAL, CKMB, CKMBINDEX, TROPONINI in the last 168 hours. BNP (last 3 results) No results for input(s): PROBNP in the last 8760 hours. HbA1C: No results for input(s): HGBA1C in the last 72 hours. CBG: No results for input(s): GLUCAP in the last 168 hours. Lipid Profile: No results for input(s): CHOL, HDL, LDLCALC, TRIG, CHOLHDL, LDLDIRECT in the last 72 hours. Thyroid Function Tests: No results for input(s): TSH, T4TOTAL, FREET4, T3FREE, THYROIDAB in the last 72 hours. Anemia Panel: No results for input(s): VITAMINB12, FOLATE, FERRITIN, TIBC, IRON, RETICCTPCT in the last 72 hours. Sepsis Labs: Recent Labs    Lab 04/16/17 0529  LATICACIDVEN 1.27    No results found for this or any previous visit (from the past 240 hour(s)).       Radiology Studies: Dg Chest 2 View  Result Date: 04/15/2017 CLINICAL DATA:  Chest pain.  Shortness of breath today. EXAM: CHEST - 2 VIEW COMPARISON:  None. FINDINGS: Right internal jugular dialysis catheter with tip at the atrial caval junction. Vascular stents in the right axilla.The cardiomediastinal contours are normal. The lungs are clear. Pulmonary vasculature is normal. No consolidation, pleural effusion, or pneumothorax. No acute osseous abnormalities are seen. IMPRESSION: 1. No acute findings. 2. Right dialysis catheter with tip at the atrial caval junction. Electronically Signed   By: Jeb Levering M.D.   On: 04/15/2017 22:43   Ct Abdomen Pelvis W Contrast  Result Date: 04/16/2017 CLINICAL DATA:  52 year old female with epigastric pain nausea and vomiting beginning yesterday morning. Chronic dialysis patient. EXAM: CT ABDOMEN AND PELVIS WITH CONTRAST TECHNIQUE:  Multidetector CT imaging of the abdomen and pelvis was performed using the standard protocol following bolus administration of intravenous contrast. CONTRAST:  126mL ISOVUE-300 IOPAMIDOL (ISOVUE-300) INJECTION 61% COMPARISON:  CT Abdomen and Pelvis 12/05/2016 FINDINGS: Lower chest: Stable cardiomegaly. Catheter tips project in the right atrium and ventricle. No pericardial effusion. Stable lung bases, mild atelectasis greater on the left. No pleural effusion. Hepatobiliary: Negative liver and gallbladder. Pancreas: Negative. Spleen: Negative. Adrenals/Urinary Tract: Adrenal glands remain normal. Complete chronic atrophy of the left renal parenchyma with residual round, dilated left renal collecting system and ureter. Chronic right nephrostomy tube remains in place and appears stable in configuration. However, there is new right perinephric inflammatory stranding and heterogeneously decreased enhancement and  contrast excretion from the right kidney on the early and delayed postcontrast images. There is trace gas within the right renal collecting system. There is no right hydronephrosis. The right ureter is decompressed. Both ureters are thought to course to an ileal conduit which is thought to be visible exiting the ventral abdominal wall on sagittal image 51. The right ureter can be traced to this structure. The distal left ureter is obscured by bowel in the ventral abdomen. No definite urinary bladder is identified. Stomach/Bowel: Negative distal colon. The descending colon is decompressed. There is a chronic large up to 17 centimeter diameter ventral abdominal wall hernia with the 4-5 centimeter hernia neck containing transverse colon and mesentery. This appears stable with no acute inflammation. Along the left superior margin of this hernia is a smaller chronic fat/mesentery only containing hernia which is stable on series 3, image 37. The right colon contains a small volume of fluid. The cecum is located in the midline or slightly to the left of midline in the lower abdomen and anterior pelvis. Negative terminal ileum. The appendix is diminutive or absent. No pericecal inflammation. No dilated small bowel. There is a small bowel to small bowel anastomosis in the right abdomen on series 3, image 64 which is stable and without adverse features. Negative stomach and duodenum. No abdominal free air or free fluid. Vascular/Lymphatic: Aortoiliac calcified atherosclerosis. Suboptimal intravascular contrast timing but the major arterial structures in the abdomen and pelvis appear patent. The portal venous system is patent. There is a right femoral region dialysis graft in place which is new since 2018. There is an adjacent round 2.8 centimeter simple fluid density probable postoperative seroma (series 3, image 91). There are chronic venous collaterals along the left Abdo and in the suprapubic subcutaneous fat. No  lymphadenopathy. Reproductive: Negative. Other: Confluent presacral stranding is stable since December. No pelvic free fluid. Musculoskeletal: No acute osseous abnormality identified. IMPRESSION: 1. Chronic right nephrostomy tube with new right kidney heterogeneous enhancement and perinephric inflammation compared to December 2018. Consider Acute Pyelonephritis. Lack of contrast excretion on the right kidney now suggests progressed right renal insufficiency. 2. Chronic complete left renal atrophy. 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 3. A right femoral vessel dialysis graft has been placed since 2018. A 2.8 centimeter postoperative seroma is adjacent. 4. Chronic pelvic fibrosis, superficial pelvic venous collaterals, Aortic Atherosclerosis (ICD10-I70.0), large ventral abdominal wall hernia containing the transverse colon, and other abdominal and pelvic findings are stable since December. Electronically Signed   By: Genevie Ann M.D.   On: 04/16/2017 07:44        Scheduled Meds: . calcitRIOL  0.75 mcg Oral Daily  . cinacalcet  90 mg Oral Q breakfast  . gabapentin  100 mg Oral QHS  . multivitamin  1 tablet Oral Daily  . sertraline  50 mg Oral QHS  . sucroferric oxyhydroxide  500 mg Oral TID WC   Continuous Infusions: . heparin 1,700 Units/hr (04/17/17 0400)  . meropenem (MERREM) IV 500 mg (04/16/17 1219)  . vancomycin       LOS: 1 day    Time spent: 35 minutes.     Elmarie Shiley, MD [facility] Pager [phone]  If 7PM-7AM, please contact night-coverage [email or web address] Password [facility] 04/17/2017, 7:41 AM

## 2017-04-17 NOTE — Progress Notes (Addendum)
ANTICOAGULATION and ANTIBIOTIC CONSULT NOTE  Pharmacy Consult for heparin; vancomycin + meropenem Indication: history of multiple DVTs; pyelonephritis w/ nephrostomy tube  Allergies  Allergen Reactions  . Ciprofloxacin Other (See Comments)    IV only-burning to her veins, can take PO  . Erythromycin Anaphylaxis  . Chlorhexidine Rash  . Other Rash and Other (See Comments)    chloraprep    Patient Measurements: Height: 5\' 7"  (170.2 cm) Weight: 254 lb 13.6 oz (115.6 kg) IBW/kg (Calculated) : 61.6 Heparin Dosing Weight: 88.1kg   Vital Signs: Temp: 98.3 F (36.8 C) (04/14 0502) Temp Source: Oral (04/14 0502) BP: 92/60 (04/14 0502) Pulse Rate: 71 (04/14 0502)  Labs: Recent Labs    04/15/17 2215 04/16/17 1425 04/16/17 1426 04/16/17 2014 04/17/17 0648  HGB 10.8*  --  8.7*  --  8.9*  HCT 33.7*  --  27.9*  --  28.2*  PLT 225  --  190  --  196  APTT  --   --   --  41* 49*  HEPARINUNFRC  --   --   --  0.41 0.16*  CREATININE 7.58* 8.29*  --   --  5.27*    Estimated Creatinine Clearance: 16.4 mL/min (A) (by C-G formula based on SCr of 5.27 mg/dL (H)).   Medical History: Past Medical History:  Diagnosis Date  . Anemia   . Anxiety   . Cervical cancer (HCC)    Cervical and ovarian  . DVT (deep venous thrombosis) ([place])   . History of kidney stones   . Hypertension    treated years ago, on no medications for at least years ( as of 2019)  . Renal disorder    ESRD - stage 5 Dialysis T/Th/Sa  . Renal insufficiency     Assessment: 52 y.o. female presented to the ED with N/V/D and fever at home. Patient has a history of multiple DVTs and is on eliquis 5mg  PO BID at home. Last reported dose was on 04/15/2017 at 2000. Pharmacy consulted to change to heparin as patient with need procedure on this admission.   APTT remains sub-therapeutic at 49 and heparin level has fallen to 0.16. No overt bleeding reported.   Patient is also on vancomycin and meropenem for suspected  pyelonephritis. Patient has a nephrostomy tube which was just changed 3 weeks prior. Tmax 100.3, WBC is within normal limits. Cultures are pending. Patient has ESRD with HD every Tuesday-Thursday-Saturday. Her last HD was yesterday afternoon for 4 hrs. Vancomycin 2g load was given prior to HD but the 1g dose was not given post HD.   Goal of Therapy:  APTT 60-102 sec Heparin level 0.3-0.7 units/ml Monitor platelets by anticoagulation protocol: Yes   Plan: Increase heparin to 1950 units/hr Recheck aPTT and HL in 8 hours.  Daily aPTT, HL (until better correlating), and CBC while on therapy.    Vancomycin 1g IV x1 today (since missed dose post HD on 4/13).  Then will plan to continue Vancomycin 1g IV post HD Tuesday-Thursday-Saturday. Will follow-up HD schedule.  Continue Merrem 500 mg IV every 24 hours.   Sloan Leiter, PharmD, BCPS, BCCCP Clinical Pharmacist Clinical phone 04/17/2017 until 3:30PM [phone] After hours, please call [phone] 04/17/2017, 8:56 AM

## 2017-04-18 ENCOUNTER — Other Ambulatory Visit: Payer: Self-pay

## 2017-04-18 ENCOUNTER — Encounter (HOSPITAL_COMMUNITY): Payer: Self-pay | Admitting: General Practice

## 2017-04-18 LAB — URINE CULTURE

## 2017-04-18 LAB — HEPARIN LEVEL (UNFRACTIONATED): Heparin Unfractionated: 0.45 IU/mL (ref 0.30–0.70)

## 2017-04-18 LAB — GASTROINTESTINAL PANEL BY PCR, STOOL (REPLACES STOOL CULTURE)

## 2017-04-18 LAB — CBC
HEMATOCRIT: 27.3 % — AB (ref 36.0–46.0)
HEMOGLOBIN: 8.6 g/dL — AB (ref 12.0–15.0)
MCH: 31.4 pg (ref 26.0–34.0)
MCHC: 31.5 g/dL (ref 30.0–36.0)
MCV: 99.6 fL (ref 78.0–100.0)
Platelets: 263 10*3/uL (ref 150–400)
RBC: 2.74 MIL/uL — AB (ref 3.87–5.11)
RDW: 15.3 % (ref 11.5–15.5)
WBC: 5.1 10*3/uL (ref 4.0–10.5)

## 2017-04-18 MED ORDER — SACCHAROMYCES BOULARDII 250 MG PO CAPS
250.0000 mg | ORAL_CAPSULE | Freq: Two times a day (BID) | ORAL | Status: DC
  Administered 2017-04-18 – 2017-04-23 (×11): 250 mg via ORAL
  Filled 2017-04-18 (×12): qty 1

## 2017-04-18 MED ORDER — PANTOPRAZOLE SODIUM 40 MG PO TBEC
40.0000 mg | DELAYED_RELEASE_TABLET | Freq: Every day | ORAL | Status: DC
  Administered 2017-04-18 – 2017-04-23 (×6): 40 mg via ORAL
  Filled 2017-04-18 (×6): qty 1

## 2017-04-18 NOTE — Progress Notes (Signed)
ANTICOAGULATION CONSULT NOTE - Follow Up Consult  Pharmacy Consult:  Heparin Indication:  History of recurrent DVTs  Allergies  Allergen Reactions  . Ciprofloxacin Other (See Comments)    IV only-burning to her veins, can take PO  . Erythromycin Anaphylaxis  . Chlorhexidine Rash  . Other Rash and Other (See Comments)    chloraprep    Patient Measurements: Height: 5\' 7"  (170.2 cm) Weight: 254 lb 13.6 oz (115.6 kg) IBW/kg (Calculated) : 61.6 Heparin Dosing Weight: 88 kg  Vital Signs: Temp: 98.3 F (36.8 C) (04/14 2158) Temp Source: Oral (04/14 2158) BP: 100/57 (04/15 0635) Pulse Rate: 61 (04/15 0635)  Labs: Recent Labs    04/15/17 2215 04/16/17 1425 04/16/17 1426  04/16/17 2014 04/17/17 0648 04/17/17 1705 04/18/17 0617  HGB 10.8*  --  8.7*  --   --  8.9*  --  8.6*  HCT 33.7*  --  27.9*  --   --  28.2*  --  27.3*  PLT 225  --  190  --   --  196  --  263  APTT  --   --   --   --  41* 49* 83*  --   HEPARINUNFRC  --   --   --    < > 0.41 0.16* 0.31 0.45  CREATININE 7.58* 8.29*  --   --   --  5.27*  --   --    < > = values in this interval not displayed.    Estimated Creatinine Clearance: 16.4 mL/min (A) (by C-G formula based on SCr of 5.27 mg/dL (H)).    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  Patient was transitioned to IV heprin for procedures this admit.  Heparin level is therapeutic; no bleeding reported.   Goal of Therapy:  Heparin level 0.3-0.7 units/ml Monitor platelets by anticoagulation protocol: Yes    Plan:  Continue heparin gtt at 2000 units/hr Daily heparin level and CBC F/U with resuming PO anticoagulation   Jahlil Ziller D. Mina Marble, PharmD, BCPS Pager:  [phone] 04/18/2017, 9:02 AM

## 2017-04-18 NOTE — Progress Notes (Signed)
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 Authorizing Provider  acetaminophen (TYLENOL) 500 MG tablet Take 1,000 mg by mouth every 6 (six) hours as needed for mild pain or headache.    Yes [provider]  apixaban (ELIQUIS) 5 MG TABS tablet Take 5 mg by mouth 2 (two) times daily.   Yes [provider]  cinacalcet (SENSIPAR) 90 MG tablet Take 90 mg by mouth daily.   Yes [provider]  gabapentin (NEURONTIN) 100 MG capsule Take 100 mg by mouth at bedtime.   Yes [provider]  multivitamin (RENA-VIT) TABS tablet Take 1 tablet by mouth daily.   Yes [provider]  mupirocin ointment (BACTROBAN) 2 % Apply 1 application topically 2 (two) times daily as needed (rash).    Yes [provider]  promethazine (PHENERGAN) 25 MG tablet Take 25 mg by mouth every 6 (six) hours as needed for nausea or vomiting.   Yes [provider]  sertraline (ZOLOFT) 50 MG tablet Take 50 mg by mouth at bedtime.    Yes [provider]  sucroferric oxyhydroxide (VELPHORO) 500 MG chewable tablet Chew 500 mg by mouth 3 (three) times daily with meals.   Yes [provider]     Vital Signs: BP (!) 100/57 (BP Location: Left Arm)   Pulse 61   Temp 98.3 F (36.8 C) (Oral)   Resp 16   Ht 5\' 7"  (1.702 m)   Wt 254 lb 13.6 oz (115.6 kg)   SpO2 97%   BMI 39.92 kg/m   Physical Exam  Abdominal: Normal appearance.  Skin: Skin is warm and dry.    Site at [place] is clean and dry NT No bleeding no sign of infection OP clear yellow 600 cc yesterday-- 350 cc today (200 cc in bag)  Nursing note and vitals reviewed.   Imaging: Dg Chest 2 View  Result Date: 04/17/2017 CLINICAL DATA:  Shortness of breath. EXAM: CHEST - 2 VIEW COMPARISON:  Chest x-ray dated April 15, 2017. FINDINGS: Unchanged tunneled right internal jugular dialysis catheter with the tip in the right atrium. The heart size and mediastinal contours are within normal limits. Normal pulmonary vascularity. No focal consolidation, pleural effusion, or pneumothorax. Unchanged right axillary vascular stents. No acute osseous abnormality. IMPRESSION: No active cardiopulmonary disease. Electronically Signed   By: Titus Dubin M.D.   On: 04/17/2017 17:21   Dg Chest 2 View  Result Date: 04/15/2017 CLINICAL DATA:  Chest pain.  Shortness of breath today. EXAM: CHEST - 2 VIEW COMPARISON:  None. FINDINGS: Right internal jugular dialysis catheter with tip at the atrial caval junction. Vascular stents in the right axilla.The cardiomediastinal contours are normal. The lungs are clear. Pulmonary vasculature is normal. No consolidation, pleural effusion, or pneumothorax. No acute osseous abnormalities are seen. IMPRESSION: 1. No acute findings. 2. Right dialysis catheter with tip at the atrial caval junction. Electronically Signed   By: Jeb Levering M.D.   On: 04/15/2017 22:43   Ct Abdomen Pelvis W Contrast  Result Date: 04/16/2017 CLINICAL  DATA:  52 year old female with epigastric pain nausea and vomiting beginning yesterday morning. Chronic dialysis patient. EXAM: CT ABDOMEN AND PELVIS WITH CONTRAST TECHNIQUE: Multidetector CT imaging of the abdomen and pelvis was performed using the standard protocol following bolus administration of intravenous contrast. CONTRAST:  197mL ISOVUE-300 IOPAMIDOL (ISOVUE-300) INJECTION 61% COMPARISON:  CT Abdomen and Pelvis 12/05/2016 FINDINGS: Lower chest:  Stable cardiomegaly. Catheter tips project in the right atrium and ventricle. No pericardial effusion. Stable lung bases, mild atelectasis greater on the left. No pleural effusion. Hepatobiliary: Negative liver and gallbladder. Pancreas: Negative. Spleen: Negative. Adrenals/Urinary Tract: Adrenal glands remain normal. Complete chronic atrophy of the left renal parenchyma with residual round, dilated left renal collecting system and ureter. Chronic right nephrostomy tube remains in place and appears stable in configuration. However, there is new right perinephric inflammatory stranding and heterogeneously decreased enhancement and contrast excretion from the right kidney on the early and delayed postcontrast images. There is trace gas within the right renal collecting system. There is no right hydronephrosis. The right ureter is decompressed. Both ureters are thought to course to an ileal conduit which is thought to be visible exiting the ventral abdominal wall on sagittal image 51. The right ureter can be traced to this structure. The distal left ureter is obscured by bowel in the ventral abdomen. No definite urinary bladder is identified. Stomach/Bowel: Negative distal colon. The descending colon is decompressed. There is a chronic large up to 17 centimeter diameter ventral abdominal wall hernia with the 4-5 centimeter hernia neck containing transverse colon and mesentery. This appears stable with no acute inflammation. Along the left superior margin of this hernia is a smaller chronic fat/mesentery only containing hernia which is stable on series 3, image 37. The right colon contains a small volume of fluid. The cecum is located in the midline or slightly to the left of midline in the lower abdomen and anterior pelvis. Negative terminal ileum. The appendix is diminutive or absent. No pericecal inflammation. No dilated small bowel. There is a small bowel to small bowel anastomosis in the right abdomen on series 3,  image 64 which is stable and without adverse features. Negative stomach and duodenum. No abdominal free air or free fluid. Vascular/Lymphatic: Aortoiliac calcified atherosclerosis. Suboptimal intravascular contrast timing but the major arterial structures in the abdomen and pelvis appear patent. The portal venous system is patent. There is a right femoral region dialysis graft in place which is new since 2018. There is an adjacent round 2.8 centimeter simple fluid density probable postoperative seroma (series 3, image 91). There are chronic venous collaterals along the left Ab[place] and in the suprapubic subcutaneous fat. No lymphadenopathy. Reproductive: Negative. Other: Confluent presacral stranding is stable since December. No pelvic free fluid. Musculoskeletal: No acute osseous abnormality identified. IMPRESSION: 1. Chronic right nephrostomy tube with new right kidney heterogeneous enhancement and perinephric inflammation compared to December 2018. Consider Acute Pyelonephritis. Lack of contrast excretion on the right kidney now suggests progressed right renal insufficiency. 2. Chronic complete left renal atrophy. 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 3. A right femoral vessel dialysis graft has been placed since 2018. A 2.8 centimeter postoperative seroma is adjacent. 4. Chronic pelvic fibrosis, superficial pelvic venous collaterals, Aortic Atherosclerosis (ICD10-I70.0), large ventral abdominal wall hernia containing the transverse colon, and other abdominal and pelvic findings are stable since December. Electronically Signed   By: Genevie Ann M.D.   On: 04/16/2017 07:44    Labs:  CBC: Recent Labs    04/15/17 2215 04/16/17 1426 04/17/17 0648 04/18/17 0617  WBC 10.5 7.8 6.4 5.1  HGB 10.8* 8.7* 8.9* 8.6*  HCT 33.7* 27.9* 28.2* 27.3*  PLT 225 190 196 263    COAGS: Recent Labs    12/03/16 1818  12/08/16 0734 03/21/17 0642 03/21/17 1630 04/16/17 2014  04/17/17 0648 04/17/17 1705  INR 1.14  --   --  1.05 0.97  --   --   --   APTT  --    < > 77*  --   --  41* 49* 83*   < > = values in this interval not displayed.    BMP: Recent Labs    03/21/17 1630 04/15/17 2215 04/16/17 1425 04/17/17 0648  NA 138 138 136 137  K 4.2 3.6 3.8 3.5  CL 103 99* 101 101  CO2 20* 22 22 22   GLUCOSE 102* 111* 84 99  BUN 43* 30* 38* 16  CALCIUM 9.1 9.7 8.6* 7.8*  CREATININE 8.06* 7.58* 8.29* 5.27*  GFRNONAA 5* 5* 5* 9*  GFRAA 6* 6* 6* 10*    LIVER FUNCTION TESTS: Recent Labs    12/03/16 1818 03/21/17 1630 04/16/17 0442 04/16/17 1425  BILITOT 0.5 0.8 0.8  --   AST 41 21 17  --   ALT 21 8* 12*  --   ALKPHOS 137* 40 55  --   PROT 8.1 6.6 7.7  --   ALBUMIN 3.1* 3.6 3.5 2.8*    Assessment and Plan:  Rt PCN intact OP clear yellow afeb Wbc wnl Better daily Plan per Dr Jeffie Pollock  Electronically Signed: Monia Sabal A, PA-C 04/18/2017, 1:21 PM   I spent a total of 15 Minutes at the the patient's bedside AND on the patient's hospital floor or unit, greater than 50% of which was counseling/coordinating care for Rt PCN

## 2017-04-18 NOTE — Progress Notes (Addendum)
PROGRESS NOTE    Judy Kelley  DQQ:[id number] DOB: [birth year] DOA: 04/15/2017 PCP: System, Pcp Not In    Brief Narrative: Judy Kelley is an 52 y.o. 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 Patient underwent change out of her nephrostomy tube 3 days ago at [facility] and did well until yesterday when she noted onset of abdominal pain, nausea, vomiting, diarrhea and fevers. Patient states that she's had diarrhea 6 and vomiting 5 last episode 8:30 last night, no blood. Patient is due for dialysis today. Patient denies chest pain or shortness of breath. Patient admits to fevers and chills.   ED Course:  The patient was noted to have an abnormal urine, was started on vancomycin and Zosyn and abdominal CT was done which showed acute pyelonephritis. Patient is admitted for further workup. Renal has been consult did and is planning on dialysis this morning.   Assessment & Plan:   Principal Problem:   Pyelonephritis Active Problems:   ESRD on dialysis ([place])   Chronic anticoagulation   History of adverse effect of venous thromboembolism (VTE) prophylaxis   Depression   1-Pyelonephritis; Related to nephrostomy tube  Has nephrostomy tube change 3 days prior to admission.  UA with too numerous to count WBC>  CT abdomen; Chronic right nephrostomy tube with new right kidney heterogeneous enhancement and perinephric inflammation compared to December 2018. Consider Acute Pyelonephritis. Lack of contrast excretion on the right  kidney now suggests progressed right renal insufficiency. Chronic complete left renal atrophy. 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  A right femoral vessel dialysis graft has been placed since 2018. A 2.8 centimeter postoperative seroma is adjacent. Continue with IV meropenem; discontinue vancomycin.  UA growing E coli. 100,000. Blood culture no growth  Evaluated by IR 4-13. No plan to exchange nephrostomy tube.    2-ESRD; HD Nephrology following.  Had HD 4-13  3-VTE; on heparin. Might be to transition back to eliquis soon.   Depression; continue with Zoloft.   Nausea, vomiting abdominal pain, diarrhea.  C diff negative.  Phenergan help with nausea. continue CT abdomen only showed pyelonephritis.  Support care.  Nausea improving. Still with diarrhea. GI pathogen pending.   Anemia; prior hb 10 --9 range,  Hb at 8 monitor.  Per renal she has received EPA.    DVT prophylaxis: IV heparin.  Code Status: full code.  Family Communication: care discussed with patient.  Disposition Plan: home when stable.    Consultants:  Nephrology IR  Procedures:  None   Antimicrobials:   primaxin      Subjective: Still with diarrhea. Nausea better, she was able to eat breakfast today    Objective: Vitals:   04/17/17 2156 04/17/17 2158 04/18/17 0635 04/18/17 1353  BP: (!) 97/49 (!) 97/49 (!) 100/57 117/64  Pulse: 66 66 61 (!) 57  Resp: 15 15 16 16   Temp: 98.3 F (36.8 C) 98.3 F (36.8 C)  97.7 F (36.5 C)  TempSrc: Oral Oral  Oral  SpO2: 97% 97% 97% 97%  Weight:      Height:  Intake/Output Summary (Last 24 hours) at 04/18/2017 1433 Last data filed at 04/18/2017 0900 Gross per 24 hour  Intake 1070.38 ml  Output 650 ml  Net 420.38 ml   Filed Weights   04/16/17 1100 04/16/17 1312 04/16/17 1723  Weight: 113.9 kg (251 lb 1.7 oz) 115.6 kg (254 lb 13.6 oz) 115.6 kg (254 lb 13.6 oz)    Examination:  General exam:  NAD Respiratory system; CTA, normal respiratory effort  Cardiovascular system: S 1, S 2 RRR Gastrointestinal system: BS present, soft, nt Central nervous system: non focal.  Extremities: symmetric power.  Skin: no rash     Data Reviewed: I have personally reviewed following labs and imaging studies  CBC: Recent Labs  Lab 04/15/17 2215 04/16/17 1426 04/17/17 0648 04/18/17 0617  WBC 10.5 7.8 6.4 5.1  HGB 10.8* 8.7* 8.9* 8.6*  HCT 33.7* 27.9* 28.2* 27.3*  MCV 96.8 97.9 99.3 99.6  PLT 225 190 196 947   Basic Metabolic Panel: Recent Labs  Lab 04/15/17 2215 04/16/17 1425 04/17/17 0648  NA 138 136 137  K 3.6 3.8 3.5  CL 99* 101 101  CO2 22 22 22   GLUCOSE 111* 84 99  BUN 30* 38* 16  CREATININE 7.58* 8.29* 5.27*  CALCIUM 9.7 8.6* 7.8*  PHOS  --  7.5*  --    GFR: Estimated Creatinine Clearance: 16.4 mL/min (A) (by C-G formula based on SCr of 5.27 mg/dL (H)). Liver Function Tests: Recent Labs  Lab 04/16/17 0442 04/16/17 1425  AST 17  --   ALT 12*  --   ALKPHOS 55  --   BILITOT 0.8  --   PROT 7.7  --   ALBUMIN 3.5 2.8*   Recent Labs  Lab 04/15/17 2215  LIPASE 44   No results for input(s): AMMONIA in the last 168 hours. Coagulation Profile: No results for input(s): INR, PROTIME in the last 168 hours. Cardiac Enzymes: No results for input(s): CKTOTAL, CKMB, CKMBINDEX, TROPONINI in the last 168 hours. BNP (last 3 results) No results for input(s): PROBNP in the last 8760 hours. HbA1C: No results for input(s): HGBA1C in the last 72 hours. CBG: No results for input(s): GLUCAP in the last 168 hours. Lipid Profile: No results for input(s): CHOL, HDL, LDLCALC, TRIG, CHOLHDL, LDLDIRECT in the last 72 hours. Thyroid Function Tests: No results for input(s): TSH, T4TOTAL, FREET4, T3FREE, THYROIDAB in the last 72 hours. Anemia Panel: No results for input(s): VITAMINB12, FOLATE, FERRITIN, TIBC, IRON, RETICCTPCT in the last 72 hours. Sepsis Labs: Recent Labs  Lab  04/16/17 0529  LATICACIDVEN 1.27    Recent Results (from the past 240 hour(s))  Urine Culture     Status: Abnormal   Collection Time: 04/16/17  5:20 AM  Result Value Ref Range Status   Specimen Description URINE, CATHETERIZED  Final   Special Requests NONE  Final   Culture (A)  Final    >=100,000 COLONIES/mL ESCHERICHIA COLI Confirmed Extended Spectrum Beta-Lactamase Producer (ESBL).  In bloodstream infections from ESBL organisms, carbapenems are preferred over piperacillin/tazobactam. They are shown to have a lower risk of mortality. Performed at [facility], [place] [address] [place], [place] [address]    Report Status 04/18/2017 FINAL  Final   Organism ID, Bacteria ESCHERICHIA COLI (A)  Final      Susceptibility   Escherichia coli - MIC*    AMPICILLIN >=32 RESISTANT Resistant     CEFAZOLIN >=64 RESISTANT Resistant     CEFTRIAXONE >=64 RESISTANT Resistant  CIPROFLOXACIN >=4 RESISTANT Resistant     GENTAMICIN <=1 SENSITIVE Sensitive     IMIPENEM <=0.25 SENSITIVE Sensitive     NITROFURANTOIN <=16 SENSITIVE Sensitive     TRIMETH/SULFA >=320 RESISTANT Resistant     AMPICILLIN/SULBACTAM 16 INTERMEDIATE Intermediate     PIP/TAZO <=4 SENSITIVE Sensitive     Extended ESBL POSITIVE Resistant     * >=100,000 COLONIES/mL ESCHERICHIA COLI  Blood culture (routine x 2)     Status: None (Preliminary result)   Collection Time: 04/16/17  5:21 AM  Result Value Ref Range Status   Specimen Description BLOOD RIGHT FOREARM  Final   Special Requests   Final    BOTTLES DRAWN AEROBIC AND ANAEROBIC Blood Culture adequate volume   Culture   Final    NO GROWTH 1 DAY Performed at [facility], [place] [address], [place], [place] [address]    Report Status PENDING  Incomplete  Blood culture (routine x 2)     Status: None (Preliminary result)   Collection Time: 04/16/17  5:31 AM  Result Value Ref Range Status   Specimen Description BLOOD RIGHT HAND  Final   Special Requests   Final     BOTTLES DRAWN AEROBIC AND ANAEROBIC Blood Culture adequate volume   Culture   Final    NO GROWTH 1 DAY Performed at [facility], [place] [address], [place], [place] [address]    Report Status PENDING  Incomplete  C difficile quick scan w PCR reflex     Status: None   Collection Time: 04/17/17  5:11 AM  Result Value Ref Range Status   C Diff antigen NEGATIVE NEGATIVE Final   C Diff toxin NEGATIVE NEGATIVE Final   C Diff interpretation No C. difficile detected.  Final    Comment: Performed at [facility], [place] [address] [place], [place] [address]  Gastrointestinal Panel by PCR , Stool     Status: None   Collection Time: 04/17/17  6:33 PM  Result Value Ref Range Status   Campylobacter species NOT DETECTED NOT DETECTED Final   Plesimonas shigelloides NOT DETECTED NOT DETECTED Final   Salmonella species NOT DETECTED NOT DETECTED Final   Yersinia enterocolitica NOT DETECTED NOT DETECTED Final   Vibrio species NOT DETECTED NOT DETECTED Final   Vibrio cholerae NOT DETECTED NOT DETECTED Final   Enteroaggregative E coli (EAEC) NOT DETECTED NOT DETECTED Final   Enteropathogenic E coli (EPEC) NOT DETECTED NOT DETECTED Final   Enterotoxigenic E coli (ETEC) NOT DETECTED NOT DETECTED Final   Shiga like toxin producing E coli (STEC) NOT DETECTED NOT DETECTED Final   Shigella/Enteroinvasive E coli (EIEC) NOT DETECTED NOT DETECTED Final   Cryptosporidium NOT DETECTED NOT DETECTED Final   Cyclospora cayetanensis NOT DETECTED NOT DETECTED Final   Entamoeba histolytica NOT DETECTED NOT DETECTED Final   Giardia lamblia NOT DETECTED NOT DETECTED Final   Adenovirus F40/41 NOT DETECTED NOT DETECTED Final   Astrovirus NOT DETECTED NOT DETECTED Final   Norovirus GI/GII NOT DETECTED NOT DETECTED Final   Rotavirus A NOT DETECTED NOT DETECTED Final   Sapovirus (I, II, IV, and V) NOT DETECTED NOT DETECTED Final    Comment: Performed at [facility], [address], [place],   [address]         Radiology Studies: Dg Chest 2 View  Result Date: 04/17/2017 CLINICAL DATA:  Shortness of breath. EXAM: CHEST - 2 VIEW COMPARISON:  Chest x-ray dated April 15, 2017. FINDINGS: Unchanged  tunneled right internal jugular dialysis catheter with the tip in the right atrium. The heart size and mediastinal contours are within normal limits. Normal pulmonary vascularity. No focal consolidation, pleural effusion, or pneumothorax. Unchanged right axillary vascular stents. No acute osseous abnormality. IMPRESSION: No active cardiopulmonary disease. Electronically Signed   By: Titus Dubin M.D.   On: 04/17/2017 17:21        Scheduled Meds: . calcitRIOL  0.75 mcg Oral Daily  . cinacalcet  90 mg Oral Q breakfast  . gabapentin  100 mg Oral QHS  . multivitamin  1 tablet Oral Daily  . pantoprazole  40 mg Oral Q1200  . sertraline  50 mg Oral QHS  . sucroferric oxyhydroxide  500 mg Oral TID WC   Continuous Infusions: . heparin 2,000 Units/hr (04/18/17 0701)  . meropenem (MERREM) IV Stopped (04/18/17 1356)     LOS: 2 days    Time spent: 35 minutes.     Elmarie Shiley, MD [facility] Pager [phone]  If 7PM-7AM, please contact night-coverage [email or web address] Password [facility] 04/18/2017, 2:33 PM

## 2017-04-18 NOTE — Progress Notes (Signed)
[facility] Progress Note   Subjective:   Continued to have diarrhea and nausea/ vomiting. Fevers coming down and WBC down as well.     Objective Vitals:   04/17/17 1330 04/17/17 2156 04/17/17 2158 04/18/17 0635  BP: 122/67 (!) 97/49 (!) 97/49 (!) 100/57  Pulse: (!) 57 66 66 61  Resp: 17 15 15 16   Temp: 97.9 F (36.6 C) 98.3 F (36.8 C) 98.3 F (36.8 C)   TempSrc: Oral Oral Oral   SpO2: 98% 97% 97% 97%  Weight:      Height:       Physical Exam General:NAD, obese, pale female laying in bed Heart:RRR, no mrg Lungs:CTAB, no Wheeze, rales or rhonchi Abdomen:soft, ND, diffuse tenderness, +BS Extremities:no edema Dialysis Access: R IJ TDC, no drainage. L thigh AVG no b/t    Dialysis Orders: [facility] 4h  400/800  113kg  2/2 bath  R IJ TDC (L thigh AVG clotted recently, not using anymore)  Heparin 4000 bolus then 500 u/ hr prn -Venofer 50mg  IV qwk -mircera 144mcg IV q2wks - last 4/11 -Calcitriol 0.60mcg PO qHD  -Last Labs: 4/11: Hgb10.2, P 7.8, Ca 8.4, 3/28: TSAT 31%, K 4.3, PTH 846, Albumin 4.2   Assessment/Plan: 1.  Pyelonephritis / R kidney/ chronic indwelling R PCN: +CT changes, +UCx +EColi. On ABX. IR consulted, no drain exchange planned for now.    2. Diarrhea - C diff negative. Per primary 3. ESRD - cont TTS HD. Up 2kg, no gross excess on exam. Using R IJ TDC. BCx's negative.  4. Recurrent clotting of HD accesses: had early clotting of AVG's in bilat UE's and more recently clotting of R thigh AVG placed 03/21/17 despite Eliquis Rx.  Not L thigh AVG candidate due to L thigh DVT. Seen by [facility] on 4/3 and deemed now to be TDC-dependent.   5. Hypotension/volume  - BP's run low. May need to add midodrine with HD. 6. Anemia of chronic disease- Hgb 10.8, ESA recently dosed. Follow 7. Secondary Hyperparathyroidism -  CCa 8.7, P7.5, continue binders, VDRA.  8. Nutrition - Alb 2.8, renal diet with fluid restrictions. Renavite. Add Nepro 9. VTE - chronic  anticoagulation - per primary   Kelly Splinter MD [facility] pgr [phone]   04/18/2017, 11:56 AM    Filed Weights   04/16/17 1100 04/16/17 1312 04/16/17 1723  Weight: 113.9 kg (251 lb 1.7 oz) 115.6 kg (254 lb 13.6 oz) 115.6 kg (254 lb 13.6 oz)    Intake/Output Summary (Last 24 hours) at 04/18/2017 1154 Last data filed at 04/18/2017 0900 Gross per 24 hour  Intake 1270.38 ml  Output 930 ml  Net 340.38 ml    Additional Objective Labs: Basic Metabolic Panel: Recent Labs  Lab 04/15/17 2215 04/16/17 1425 04/17/17 0648  NA 138 136 137  K 3.6 3.8 3.5  CL 99* 101 101  CO2 22 22 22   GLUCOSE 111* 84 99  BUN 30* 38* 16  CREATININE 7.58* 8.29* 5.27*  CALCIUM 9.7 8.6* 7.8*  PHOS  --  7.5*  --    Liver Function Tests: Recent Labs  Lab 04/16/17 0442 04/16/17 1425  AST 17  --   ALT 12*  --   ALKPHOS 55  --   BILITOT 0.8  --   PROT 7.7  --   ALBUMIN 3.5 2.8*   Recent Labs  Lab 04/15/17 2215  LIPASE 44   CBC: Recent Labs  Lab 04/15/17 2215 04/16/17 1426 04/17/17 0648 04/18/17  0617  WBC 10.5 7.8 6.4 5.1  HGB 10.8* 8.7* 8.9* 8.6*  HCT 33.7* 27.9* 28.2* 27.3*  MCV 96.8 97.9 99.3 99.6  PLT 225 190 196 263   Blood Culture    Component Value Date/Time   SDES BLOOD RIGHT HAND 04/16/2017 0531   SPECREQUEST  04/16/2017 0531    BOTTLES DRAWN AEROBIC AND ANAEROBIC Blood Culture adequate volume   CULT  04/16/2017 0531    NO GROWTH 1 DAY Performed at [facility], [place] [address], [place], [place] [address]    REPTSTATUS PENDING 04/16/2017 0531   Studies/Results: Dg Chest 2 View  Result Date: 04/17/2017 CLINICAL DATA:  Shortness of breath. EXAM: CHEST - 2 VIEW COMPARISON:  Chest x-ray dated April 15, 2017. FINDINGS: Unchanged tunneled right internal jugular dialysis catheter with the tip in the right atrium. The heart size and mediastinal contours are within normal limits. Normal pulmonary vascularity. No focal consolidation, pleural  effusion, or pneumothorax. Unchanged right axillary vascular stents. No acute osseous abnormality. IMPRESSION: No active cardiopulmonary disease. Electronically Signed   By: Titus Dubin M.D.   On: 04/17/2017 17:21    Medications: . heparin 2,000 Units/hr (04/18/17 0701)  . meropenem (MERREM) IV Stopped (04/17/17 1209)   . calcitRIOL  0.75 mcg Oral Daily  . cinacalcet  90 mg Oral Q breakfast  . gabapentin  100 mg Oral QHS  . multivitamin  1 tablet Oral Daily  . pantoprazole  40 mg Oral Q1200  . sertraline  50 mg Oral QHS  . sucroferric oxyhydroxide  500 mg Oral TID WC

## 2017-04-18 NOTE — Progress Notes (Signed)
Patient ID: Judy Kelley, female   DOB: 11-16-1965, 52 y.o.   MRN: [id number]   Aware of admission to [facility] PA will place on round list and follow

## 2017-04-19 LAB — CBC
HCT: 28.5 % — ABNORMAL LOW (ref 36.0–46.0)
HCT: 28.4 % — ABNORMAL LOW (ref 36.0–46.0)
Hemoglobin: 8.6 g/dL — ABNORMAL LOW (ref 12.0–15.0)
Hemoglobin: 8.6 g/dL — ABNORMAL LOW (ref 12.0–15.0)
MCH: 30.1 pg (ref 26.0–34.0)
MCH: 30.2 pg (ref 26.0–34.0)
MCHC: 30.2 g/dL (ref 30.0–36.0)
MCHC: 30.3 g/dL (ref 30.0–36.0)
MCV: 99.7 fL (ref 78.0–100.0)
MCV: 99.6 fL (ref 78.0–100.0)
PLATELETS: 313 10*3/uL (ref 150–400)
Platelets: 263 K/uL (ref 150–400)
RBC: 2.86 MIL/uL — ABNORMAL LOW (ref 3.87–5.11)
RBC: 2.85 MIL/uL — ABNORMAL LOW (ref 3.87–5.11)
RDW: 15 % (ref 11.5–15.5)
RDW: 15 % (ref 11.5–15.5)
WBC: 4.4 K/uL (ref 4.0–10.5)
WBC: 4.9 10*3/uL (ref 4.0–10.5)

## 2017-04-19 LAB — RENAL FUNCTION PANEL
ALBUMIN: 2.6 g/dL — AB (ref 3.5–5.0)
Anion gap: 12 (ref 5–15)
BUN: 23 mg/dL — AB (ref 6–20)
CO2: 22 mmol/L (ref 22–32)
CREATININE: 9.22 mg/dL — AB (ref 0.44–1.00)
Calcium: 8.1 mg/dL — ABNORMAL LOW (ref 8.9–10.3)
Chloride: 107 mmol/L (ref 101–111)
GFR calc Af Amer: 5 mL/min — ABNORMAL LOW (ref >60)
GFR, EST NON AFRICAN AMERICAN: 4 mL/min — AB (ref >60)
Glucose, Bld: 92 mg/dL (ref 65–99)
PHOSPHORUS: 5 mg/dL — AB (ref 2.5–4.6)
Potassium: 3.5 mmol/L (ref 3.5–5.1)
SODIUM: 141 mmol/L (ref 135–145)

## 2017-04-19 LAB — HEPARIN LEVEL (UNFRACTIONATED): Heparin Unfractionated: 0.52 [IU]/mL (ref 0.30–0.70)

## 2017-04-19 MED ORDER — CALCITRIOL 0.5 MCG PO CAPS
ORAL_CAPSULE | ORAL | Status: AC
  Administered 2017-04-19: 12:00:00
  Filled 2017-04-19: qty 1

## 2017-04-19 MED ORDER — APIXABAN 5 MG PO TABS
5.0000 mg | ORAL_TABLET | Freq: Two times a day (BID) | ORAL | Status: DC
  Administered 2017-04-19 – 2017-04-23 (×9): 5 mg via ORAL
  Filled 2017-04-19 (×9): qty 1

## 2017-04-19 MED ORDER — SACCHAROMYCES BOULARDII 250 MG PO CAPS: 250.0000 mg | ORAL_CAPSULE | Freq: Two times a day (BID) | ORAL | Status: DC

## 2017-04-19 MED ORDER — HEPARIN SODIUM (PORCINE) 1000 UNIT/ML DIALYSIS
4000.0000 [IU] | INTRAMUSCULAR | Status: DC | PRN
  Filled 2017-04-19: qty 4

## 2017-04-19 MED ORDER — CALCITRIOL 0.25 MCG PO CAPS
ORAL_CAPSULE | ORAL | Status: AC
  Administered 2017-04-19: 12:00:00
  Filled 2017-04-19: qty 1

## 2017-04-19 NOTE — Progress Notes (Addendum)
ANTICOAGULATION CONSULT NOTE - Follow Up Consult  Pharmacy Consult:  Heparin Indication:  History of recurrent DVTs  Allergies  Allergen Reactions  . Ciprofloxacin Other (See Comments)    IV only-burning to her veins, can take PO  . Erythromycin Anaphylaxis  . Chlorhexidine Rash  . Other Rash and Other (See Comments)    chloraprep    Patient Measurements: Height: 5\' 7"  (170.2 cm) Weight: 251 lb 12.3 oz (114.2 kg) IBW/kg (Calculated) : 61.6 Heparin Dosing Weight: 88 kg  Vital Signs: Temp: 98.1 F (36.7 C) (04/16 0740) Temp Source: Oral (04/16 0740) BP: 94/55 (04/16 0815) Pulse Rate: 53 (04/16 0815)  Labs: Recent Labs    04/16/17 1425  04/16/17 2014 04/17/17 0648 04/17/17 1705 04/18/17 0617 04/19/17 0414 04/19/17 0817  HGB  --    < >  --  8.9*  --  8.6* 8.6* 8.6*  HCT  --    < >  --  28.2*  --  27.3* 28.5* 28.4*  PLT  --    < >  --  196  --  263 263 313  APTT  --   --  41* 49* 83*  --   --   --   HEPARINUNFRC  --    < > 0.41 0.16* 0.31 0.45 0.52  --   CREATININE 8.29*  --   --  5.27*  --   --   --   --    < > = values in this interval not displayed.    Estimated Creatinine Clearance: 16.3 mL/min (A) (by C-G formula based on SCr of 5.27 mg/dL (H)).    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  Patient was transitioned to IV heparin.  Heparin level is therapeutic; no bleeding reported.   Goal of Therapy:  Heparin level 0.3-0.7 units/ml Monitor platelets by anticoagulation protocol: Yes    Plan:  Continue heparin gtt at 2000 units/hr Daily heparin level and CBC F/U with resuming PO anticoagulation   Blinda Turek D. Mina Marble, PharmD, BCPS Pager:  [phone] 04/19/2017, 9:06 AM   ========================================   Addendum: Transition from IV heparin to Eliquis.  Stop heparin when Eliquis is administered. Eliquis 5mg  PO BID  Pharmacy will sign off and follow peripherally.  Thank you for the consult!   Safiatou Islam D. Mina Marble, PharmD,  BCPS Pager:  [phone] 04/19/2017, 2:45 PM

## 2017-04-19 NOTE — Progress Notes (Signed)
PROGRESS NOTE    Judy Kelley  RCV:[id number] DOB: [birth year] DOA: 04/15/2017 PCP: System, Pcp Not In    Brief Narrative: Judy Kelley is an 52 y.o. 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 Patient underwent change out of her nephrostomy tube 3 days ago at [facility] and did well until yesterday when she noted onset of abdominal pain, nausea, vomiting, diarrhea and fevers. Patient states that she's had diarrhea 6 and vomiting 5 last episode 8:30 last night, no blood. Patient is due for dialysis today. Patient denies chest pain or shortness of breath. Patient admits to fevers and chills.   ED Course:  The patient was noted to have an abnormal urine, was started on vancomycin and Zosyn and abdominal CT was done which showed acute pyelonephritis. Patient is admitted for further workup. Renal has been consult did and is planning on dialysis this morning.   Assessment & Plan:   Principal Problem:   Pyelonephritis Active Problems:   ESRD on dialysis ([place])   Chronic anticoagulation   History of adverse effect of venous thromboembolism (VTE) prophylaxis   Depression   1-Pyelonephritis; Related to nephrostomy tube  Has nephrostomy tube change 3 days prior to admission.  UA with too numerous to count WBC>  CT abdomen; Chronic right nephrostomy tube with new right kidney heterogeneous enhancement and perinephric inflammation compared to December 2018. Consider Acute Pyelonephritis. Lack of contrast excretion on the right  kidney now suggests progressed right renal insufficiency. Chronic complete left renal atrophy. 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  A right femoral vessel dialysis graft has been placed since 2018. A 2.8 centimeter postoperative seroma is adjacent. Continue with IV meropenem; discontinue vancomycin.  UA growing E coli. 100,000. Blood culture no growth  Evaluated by IR 4-13. No plan to exchange nephrostomy tube.  Improving.   2-ESRD; HD Nephrology following.  Had HD 4-13  3-VTE; DVT, time 3.  on heparin. Will transition to eliquis today   Depression; continue with Zoloft.   Nausea, vomiting abdominal pain, diarrhea.  C diff negative.  Phenergan help with nausea. continue CT abdomen only showed pyelonephritis.  Support care.  Nausea improving. Still with diarrhea. GI pathogen negative Started on florastore.   Anemia; prior hb 10 --9 range,  Hb at 8 monitor.  Per renal she has received EPA.    DVT prophylaxis: IV heparin.  Code Status: full code.  Family Communication: care discussed with patient.  Disposition Plan: home when stable.    Consultants:  Nephrology IR  Procedures:  None   Antimicrobials:   primaxin      Subjective: She is feeling better, nausea improved. Diarrhea persist but better  Abdominal pain improved.   Objective: Vitals:   04/19/17 1045 04/19/17 1115 04/19/17 1140 04/19/17 1236  BP: 105/66 107/66 105/69 105/65  Pulse: (!) 54 (!) 52 (!) 54 63  Resp:   18 16  Temp:   97.9 F (36.6 C) 98.4 F (36.9 C)  TempSrc:   Oral Oral  SpO2:   98% 98%  Weight:   112.1 kg (  247 lb 2.2 oz)   Height:        Intake/Output Summary (Last 24 hours) at 04/19/2017 1426 Last data filed at 04/19/2017 1140 Gross per 24 hour  Intake 460 ml  Output 2000 ml  Net -1540 ml   Filed Weights   04/16/17 1723 04/19/17 0732 04/19/17 1140  Weight: 115.6 kg (254 lb 13.6 oz) 114.2 kg (251 lb 12.3 oz) 112.1 kg (247 lb  2.2 oz)    Examination:  General exam: NAD Respiratory system; CTA, normal respiratory effort.  Cardiovascular system: S 1, S 2 RRR Gastrointestinal system: BS present, soft, nt Central nervous system: non focal.  Extremities: Symmetric power.  Skin: no rash     Data Reviewed: I have personally reviewed following labs and imaging studies  CBC: Recent Labs  Lab 04/16/17 1426 04/17/17 0648 04/18/17 0617 04/19/17 0414 04/19/17 0817  WBC 7.8 6.4 5.1 4.4 4.9  HGB 8.7* 8.9* 8.6* 8.6* 8.6*  HCT 27.9* 28.2* 27.3* 28.5* 28.4*  MCV 97.9 99.3 99.6 99.7 99.6  PLT 190 196 263 263 426   Basic Metabolic Panel: Recent Labs  Lab 04/15/17 2215 04/16/17 1425 04/17/17 0648 04/19/17 0817  NA 138 136 137 141  K 3.6 3.8 3.5 3.5  CL 99* 101 101 107  CO2 22 22 22 22   GLUCOSE 111* 84 99 92  BUN 30* 38* 16 23*  CREATININE 7.58* 8.29* 5.27* 9.22*  CALCIUM 9.7 8.6* 7.8* 8.1*  PHOS  --  7.5*  --  5.0*   GFR: Estimated Creatinine Clearance: 9.2 mL/min (A) (by C-G formula based on SCr of 9.22 mg/dL (H)). Liver Function Tests: Recent Labs  Lab 04/16/17 0442 04/16/17 1425 04/19/17 0817  AST 17  --   --   ALT 12*  --   --   ALKPHOS 55  --   --   BILITOT 0.8  --   --   PROT 7.7  --   --   ALBUMIN 3.5 2.8* 2.6*   Recent Labs  Lab 04/15/17 2215  LIPASE 44   No results for input(s): AMMONIA in the last 168 hours. Coagulation Profile: No results for input(s): INR, PROTIME in the last 168 hours. Cardiac Enzymes: No results for input(s): CKTOTAL, CKMB, CKMBINDEX, TROPONINI in the last 168 hours. BNP (last 3 results) No results for input(s): PROBNP in the last 8760 hours. HbA1C: No results for input(s): HGBA1C in the last 72 hours. CBG: No results for input(s): GLUCAP in the last 168 hours. Lipid Profile: No results for input(s): CHOL, HDL, LDLCALC, TRIG, CHOLHDL, LDLDIRECT in the last 72 hours. Thyroid Function Tests: No results for input(s): TSH, T4TOTAL, FREET4, T3FREE,  THYROIDAB in the last 72 hours. Anemia Panel: No results for input(s): VITAMINB12, FOLATE, FERRITIN, TIBC, IRON, RETICCTPCT in the last 72 hours. Sepsis Labs: Recent Labs  Lab 04/16/17 0529  LATICACIDVEN 1.27    Recent Results (from the past 240 hour(s))  Urine Culture     Status: Abnormal   Collection Time: 04/16/17  5:20 AM  Result Value Ref Range Status   Specimen Description URINE, CATHETERIZED  Final   Special Requests NONE  Final   Culture (A)  Final    >=100,000 COLONIES/mL ESCHERICHIA COLI Confirmed Extended Spectrum Beta-Lactamase Producer (ESBL).  In bloodstream infections from ESBL organisms, carbapenems are preferred over piperacillin/tazobactam. They are shown to have a lower risk of mortality. Performed at [facility], [place] [address] [place], [place] [address]    Report Status 04/18/2017 FINAL  Final   Organism ID, Bacteria ESCHERICHIA COLI (A)  Final      Susceptibility   Escherichia coli - MIC*    AMPICILLIN >=32 RESISTANT Resistant     CEFAZOLIN >=64 RESISTANT Resistant     CEFTRIAXONE >=64 RESISTANT Resistant     CIPROFLOXACIN >=4 RESISTANT Resistant     GENTAMICIN <=1 SENSITIVE Sensitive     IMIPENEM <=0.25 SENSITIVE Sensitive     NITROFURANTOIN <=16 SENSITIVE Sensitive     TRIMETH/SULFA >=320 RESISTANT Resistant     AMPICILLIN/SULBACTAM 16 INTERMEDIATE Intermediate     PIP/TAZO <=4 SENSITIVE Sensitive     Extended ESBL POSITIVE Resistant     * >=100,000 COLONIES/mL ESCHERICHIA COLI  Blood culture (routine x 2)     Status: None (Preliminary result)   Collection Time: 04/16/17  5:21 AM  Result Value Ref Range Status   Specimen Description BLOOD RIGHT FOREARM  Final   Special Requests   Final    BOTTLES DRAWN AEROBIC AND ANAEROBIC Blood Culture adequate volume   Culture   Final    NO GROWTH 3 DAYS Performed at [facility], [address], [place], [place] [address]    Report Status PENDING  Incomplete  Blood culture (routine x 2)      Status: None (Preliminary result)   Collection Time: 04/16/17  5:31 AM  Result Value Ref Range Status   Specimen Description BLOOD RIGHT HAND  Final   Special Requests   Final    BOTTLES DRAWN AEROBIC AND ANAEROBIC Blood Culture adequate volume   Culture   Final    NO GROWTH 3 DAYS Performed at [facility], [place] [address], [place], [place] [address]    Report Status PENDING  Incomplete  C difficile quick scan w PCR reflex     Status: None   Collection Time: 04/17/17  5:11 AM  Result Value Ref Range Status   C Diff antigen NEGATIVE NEGATIVE Final   C Diff toxin NEGATIVE NEGATIVE Final   C Diff interpretation No C. difficile detected.  Final    Comment: Performed at [facility], [place] [address] [place], [place] [address]  Gastrointestinal Panel by PCR , Stool     Status: None   Collection Time: 04/17/17  6:33 PM  Result Value Ref Range Status   Campylobacter species NOT DETECTED NOT DETECTED Final   Plesimonas shigelloides NOT DETECTED NOT DETECTED Final   Salmonella species NOT DETECTED NOT DETECTED Final   Yersinia enterocolitica NOT DETECTED NOT DETECTED Final   Vibrio species NOT DETECTED NOT DETECTED Final   Vibrio cholerae NOT DETECTED NOT DETECTED Final   Enteroaggregative E coli (EAEC) NOT DETECTED NOT DETECTED Final   Enteropathogenic E coli (EPEC) NOT DETECTED NOT DETECTED Final   Enterotoxigenic E coli (ETEC) NOT DETECTED NOT DETECTED Final   Shiga like toxin producing E coli (STEC) NOT DETECTED NOT DETECTED Final   Shigella/Enteroinvasive E coli (EIEC) NOT DETECTED NOT DETECTED Final   Cryptosporidium NOT DETECTED NOT DETECTED Final   Cyclospora cayetanensis NOT DETECTED NOT DETECTED Final   Entamoeba histolytica NOT DETECTED NOT DETECTED Final   Giardia lamblia NOT DETECTED NOT DETECTED Final   Adenovirus F40/41 NOT DETECTED NOT DETECTED Final   Astrovirus NOT DETECTED NOT DETECTED Final   Norovirus GI/GII NOT DETECTED NOT DETECTED Final   Rotavirus A  NOT DETECTED NOT DETECTED Final   Sapovirus (I, II, IV, and V) NOT DETECTED NOT DETECTED Final    Comment: Performed  at [facility], [address] [place], [place] [address]         Radiology Studies: Dg Chest 2 View  Result Date: 04/17/2017 CLINICAL DATA:  Shortness of breath. EXAM: CHEST - 2 VIEW COMPARISON:  Chest x-ray dated April 15, 2017. FINDINGS: Unchanged tunneled right internal jugular dialysis catheter with the tip in the right atrium. The heart size and mediastinal contours are within normal limits. Normal pulmonary vascularity. No focal consolidation, pleural effusion, or pneumothorax. Unchanged right axillary vascular stents. No acute osseous abnormality. IMPRESSION: No active cardiopulmonary disease. Electronically Signed   By: Titus Dubin M.D.   On: 04/17/2017 17:21        Scheduled Meds: . calcitRIOL  0.75 mcg Oral Daily  . cinacalcet  90 mg Oral Q breakfast  . gabapentin  100 mg Oral QHS  . multivitamin  1 tablet Oral Daily  . pantoprazole  40 mg Oral Q1200  . saccharomyces boulardii  250 mg Oral BID  . sertraline  50 mg Oral QHS  . sucroferric oxyhydroxide  500 mg Oral TID WC   Continuous Infusions: . heparin 2,000 Units/hr (04/19/17 1208)  . meropenem (MERREM) IV Stopped (04/19/17 1326)     LOS: 3 days    Time spent: 35 minutes.     Elmarie Shiley, MD [facility] Pager [phone]  If 7PM-7AM, please contact night-coverage [email or web address] Password [email or web address] 04/19/2017, 2:26 PM

## 2017-04-19 NOTE — Discharge Instructions (Signed)
Information on my medicine - ELIQUIS® (apixaban) ° °This medication education was reviewed with me or my healthcare representative as part of my discharge preparation.  The pharmacist that spoke with me during my hospital stay was:  °Adalei Novell Dien, RPH ° °Why was Eliquis® prescribed for you? °Eliquis® was prescribed to treat blood clots that may have been found in the veins of your legs (deep vein thrombosis) or in your lungs (pulmonary embolism) and to reduce the risk of them occurring again. ° °What do You need to know about Eliquis® ? °The dose is ONE 5 mg tablet taken TWICE daily.  Eliquis® may be taken with or without food.  ° °Try to take the dose about the same time in the morning and in the evening. If you have difficulty swallowing the tablet whole please discuss with your pharmacist how to take the medication safely. ° °Take Eliquis® exactly as prescribed and DO NOT stop taking Eliquis® without talking to the doctor who prescribed the medication.  Stopping may increase your risk of developing a new blood clot.  Refill your prescription before you run out. ° °After discharge, you should have regular check-up appointments with your healthcare provider that is prescribing your Eliquis®. °   °What do you do if you miss a dose? °If a dose of ELIQUIS® is not taken at the scheduled time, take it as soon as possible on the same day and twice-daily administration should be resumed. The dose should not be doubled to make up for a missed dose. ° °Important Safety Information °A possible side effect of Eliquis® is bleeding. You should call your healthcare provider right away if you experience any of the following: °? Bleeding from an injury or your nose that does not stop. °? Unusual colored urine (red or dark brown) or unusual colored stools (red or black). °? Unusual bruising for unknown reasons. °? A serious fall or if you hit your head (even if there is no bleeding). ° °Some medicines may interact with Eliquis®  and might increase your risk of bleeding or clotting while on Eliquis®. To help avoid this, consult your healthcare provider or pharmacist prior to using any new prescription or non-prescription medications, including herbals, vitamins, non-steroidal anti-inflammatory drugs (NSAIDs) and supplements. ° °This website has more information on Eliquis® (apixaban): http://www.eliquis.com/eliquis/home ° °

## 2017-04-19 NOTE — Progress Notes (Signed)
[facility] Progress Note   Subjective:   Vomiting better, still diarrhea.  Fevers better.   Objective Vitals:   04/19/17 1045 04/19/17 1115 04/19/17 1140 04/19/17 1236  BP: 105/66 107/66 105/69 105/65  Pulse: (!) 54 (!) 52 (!) 54 63  Resp:   18 16  Temp:   97.9 F (36.6 C) 98.4 F (36.9 C)  TempSrc:   Oral Oral  SpO2:   98% 98%  Weight:   112.1 kg (247 lb 2.2 oz)   Height:       Physical Exam General:NAD, obese, pale female laying in bed Heart:RRR, no mrg Lungs:CTAB, no Wheeze, rales or rhonchi Abdomen:soft, ND, diffuse tenderness, +BS Extremities:no edema Dialysis Access: R IJ TDC, no drainage. L thigh AVG no b/t    Dialysis Orders: [facility] 4h  400/800  113kg  2/2 bath  R IJ TDC (L thigh AVG clotted recently, not using anymore)  Heparin 4000 bolus then 500 u/ hr prn -Venofer 50mg  IV qwk -mircera 150mcg IV q2wks - last 4/11 -Calcitriol 0.74mcg PO qHD  -Last Labs: 4/11: Hgb10.2, P 7.8, Ca 8.4, 3/28: TSAT 31%, K 4.3, PTH 846, Albumin 4.2   Assessment/Plan: 1.  Pyelonephritis / R kidney/ chronic indwelling R PCN: +CT changes, +UCx +EColi. On ABX. IR consulted, no drain exchange planned for now.    2. Diarrhea/ nausea/ vomiting - C diff negative. May be a little better 3. ESRD - on HD TTS. HD today, using R IJ TDC 4. Recurrent clotting of HD accesses: had early clotting of AVG's in bilat UE's and more recently clotting of R thigh AVG placed 03/21/17 despite Eliquis Rx.  Not L thigh AVG candidate due to L thigh DVT. Seen by VVS on 4/3, is now [facility] dependent.   5. Hypotension/volume  - BP's run low, no need midodrine at this time. Close to dry wt.  6. Anemia of chronic disease- Hgb 10.8, ESA recently dosed. Follow 7. Secondary Hyperparathyroidism -  CCa 8.7, P7.5, continue binders, VDRA.  8. Nutrition - Alb 2.8, renal diet with fluid restrictions. Renavite. Add Nepro 9. VTE - chronic anticoagulation - per primary   Kelly Splinter MD [facility] pgr [phone]   04/18/2017, 11:56 AM    Filed Weights   04/16/17 1723 04/19/17 0732 04/19/17 1140  Weight: 115.6 kg (254 lb 13.6 oz) 114.2 kg (251 lb 12.3 oz) 112.1 kg (247 lb 2.2 oz)    Intake/Output Summary (Last 24 hours) at 04/19/2017 1329 Last data filed at 04/19/2017 1140 Gross per 24 hour  Intake 460 ml  Output 2000 ml  Net -1540 ml    Additional Objective Labs: Basic Metabolic Panel: Recent Labs  Lab 04/16/17 1425 04/17/17 0648 04/19/17 0817  NA 136 137 141  K 3.8 3.5 3.5  CL 101 101 107  CO2 22 22 22   GLUCOSE 84 99 92  BUN 38* 16 23*  CREATININE 8.29* 5.27* 9.22*  CALCIUM 8.6* 7.8* 8.1*  PHOS 7.5*  --  5.0*   Liver Function Tests: Recent Labs  Lab 04/16/17 0442 04/16/17 1425 04/19/17 0817  AST 17  --   --   ALT 12*  --   --   ALKPHOS 55  --   --   BILITOT 0.8  --   --   PROT 7.7  --   --   ALBUMIN 3.5 2.8* 2.6*   Recent Labs  Lab 04/15/17 2215  LIPASE 44   CBC: Recent Labs  Lab 04/16/17  1426 04/17/17 0648 04/18/17 0617 04/19/17 0414 04/19/17 0817  WBC 7.8 6.4 5.1 4.4 4.9  HGB 8.7* 8.9* 8.6* 8.6* 8.6*  HCT 27.9* 28.2* 27.3* 28.5* 28.4*  MCV 97.9 99.3 99.6 99.7 99.6  PLT 190 196 263 263 313   Blood Culture    Component Value Date/Time   SDES BLOOD RIGHT HAND 04/16/2017 0531   SPECREQUEST  04/16/2017 0531    BOTTLES DRAWN AEROBIC AND ANAEROBIC Blood Culture adequate volume   CULT  04/16/2017 0531    NO GROWTH 2 DAYS Performed at [facility] [address], [place], [place] [address]    REPTSTATUS PENDING 04/16/2017 0531   Studies/Results: Dg Chest 2 View  Result Date: 04/17/2017 CLINICAL DATA:  Shortness of breath. EXAM: CHEST - 2 VIEW COMPARISON:  Chest x-ray dated April 15, 2017. FINDINGS: Unchanged tunneled right internal jugular dialysis catheter with the tip in the right atrium. The heart size and mediastinal contours are within normal limits. Normal pulmonary vascularity. No focal consolidation,  pleural effusion, or pneumothorax. Unchanged right axillary vascular stents. No acute osseous abnormality. IMPRESSION: No active cardiopulmonary disease. Electronically Signed   By: Titus Dubin M.D.   On: 04/17/2017 17:21    Medications: . heparin 2,000 Units/hr (04/19/17 1208)  . meropenem (MERREM) IV 500 mg (04/19/17 1256)   . calcitRIOL  0.75 mcg Oral Daily  . cinacalcet  90 mg Oral Q breakfast  . gabapentin  100 mg Oral QHS  . multivitamin  1 tablet Oral Daily  . pantoprazole  40 mg Oral Q1200  . saccharomyces boulardii  250 mg Oral BID  . saccharomyces boulardii  250 mg Oral BID  . sertraline  50 mg Oral QHS  . sucroferric oxyhydroxide  500 mg Oral TID WC

## 2017-04-20 DIAGNOSIS — F329 Major depressive disorder, single episode, unspecified: Secondary | ICD-10-CM

## 2017-04-20 DIAGNOSIS — Z7901 Long term (current) use of anticoagulants: Secondary | ICD-10-CM

## 2017-04-20 DIAGNOSIS — Z992 Dependence on renal dialysis: Secondary | ICD-10-CM

## 2017-04-20 DIAGNOSIS — N186 End stage renal disease: Secondary | ICD-10-CM

## 2017-04-20 DIAGNOSIS — N12 Tubulo-interstitial nephritis, not specified as acute or chronic: Secondary | ICD-10-CM

## 2017-04-20 MED ORDER — DARBEPOETIN ALFA 200 MCG/0.4ML IJ SOSY
200.0000 ug | PREFILLED_SYRINGE | INTRAMUSCULAR | Status: DC
  Administered 2017-04-21: 200 ug via INTRAVENOUS
  Filled 2017-04-20: qty 0.4

## 2017-04-20 MED ORDER — LANTHANUM CARBONATE 500 MG PO CHEW
1000.0000 mg | CHEWABLE_TABLET | Freq: Three times a day (TID) | ORAL | Status: DC
  Administered 2017-04-20 – 2017-04-23 (×8): 1000 mg via ORAL
  Filled 2017-04-20 (×9): qty 2

## 2017-04-20 NOTE — Progress Notes (Signed)
PROGRESS NOTE  Judy Kelley  WRU:[id number] DOB: [birth year] DOA: 04/15/2017 PCP: System, Pcp Not In   Brief Narrative: Judy Kelley is a 52 y.o. 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 Patient underwent change out of her nephrostomy tube 3 days PTA and noted abdominal pain, nausea, vomiting, diarrhea and fevers. Urinalysis suggested UTI and CT demonstrated acute right pyelonephritis. Nephrology was consulted, supervising HD while inpatient. Urine culture has grown ESBL E. coli, treated with meropenem. Diarrhea has continued, so binders were changed by renal.   Assessment & Plan: Principal Problem:   Pyelonephritis Active Problems:   ESRD on dialysis [facility])   Chronic anticoagulation   History of adverse effect of venous thromboembolism (VTE) prophylaxis   Depression  Acute right pyelonephritis due to ESBL E. coli: Complicated by right nephrostomy tube (exchanged 4/10). - Continue meropenem, will need to discuss definitive abx therapy with ID.  - Per IR, no repeat exchange of nephrostomy tube is indicated.   ESRD:  - Continue TTS HD through right [facility] per nephrology.  Recurrent VTE: Including DVT despite eliquis.  - Started on heparin initially, and converted back to home eliquis 4/16 with no procedures planned.  - Has follow up with vascular surgery [facility] as she is current TDC-dependent  Diarrhea: N/V have subsided, and diarrhea improved, but remains. CDiff, GI pathogen panel negative. No colitis on CT.  - Continue florastor for antibiotic-associated component - Changed binder per nephrology  Anemia of chronic disease: Hgb  stable at 8.6-8.9.  - Aranesp per renal, monitor CBC prn bleeding and intermittently.   Depression: Mood is stable - Continue home medication  DVT prophylaxis: Heparin Code Status: Full Family Communication: None at bedside Disposition Plan: Home when improved.   Consultants:   Nephrology  Procedures:  HD 4/16  Antimicrobials:  Vancomycin, zosyn 4/12  Meropenem 4/13 >>   Subjective: No fevers, chills, chest pain, dyspnea, new extremity swelling or bleeding noted. Having >5 loose stools per day with very mild nausea, still able to eat and no emesis.   Objective: Vitals:   04/19/17 1140 04/19/17 1236 04/19/17 2050 04/20/17 1350  BP: 105/69 105/65 104/62 121/80  Pulse: (!) 54 63 (!) 58 64  Resp: 18 16 16 16   Temp: 97.9 F (36.6 C) 98.4 F (36.9 C) 98.2 F (36.8 C) 98.8 F (37.1 C)  TempSrc: Oral Oral Oral Oral  SpO2: 98% 98% 99% 99%  Weight: 112.1 kg (247 lb 2.2 oz)     Height:        Intake/Output Summary (Last 24 hours) at 04/20/2017 1634 Last data filed at 04/20/2017 1300 Gross per 24 hour  Intake 600 ml  Output 500 ml  Net 100 ml   Filed Weights   04/16/17 1723 04/19/17 0732 04/19/17 1140  Weight: 115.6 kg (254 lb 13.6 oz) 114.2 kg (251 lb 12.3 oz) 112.1 kg (247 lb 2.2 oz)    Gen: Pleasant, obese female in no distress Pulm: Non-labored breathing room air. Clear to auscultation bilaterally.  CV: Regular rate and rhythm. No murmur, rub, or gallop. No JVD, Left 1+ nonpitting pedal edema. GI: Abdomen soft, non-tender, non-distended, with normoactive bowel sounds. No organomegaly or masses felt. Ext:  Warm, no deformities. Upper extremity grafts without tenderness, bruit, or thrill. Right femoral graft palpated without thrill, bruit, or tenderness.  Skin: Right posterior PCN site without significant erythema, discharge, tenderness, induration. Abdominal abrasion with granulation tissue, scant sanguinous discharge on bandage and in wound bed without active  bleeding or discharge. Right TDC site c/d/i with mild tenderness but no erythema or discharge.  Neuro: Alert and oriented. No focal neurological deficits. Psych: Judgement and insight appear normal. Mood & affect appropriate.   Data Reviewed: I have personally reviewed following labs and imaging studies  CBC: Recent Labs  Lab 04/16/17 1426 04/17/17 0648 04/18/17 0617 04/19/17 0414 04/19/17 0817  WBC 7.8 6.4 5.1 4.4 4.9  HGB 8.7* 8.9* 8.6* 8.6* 8.6*  HCT 27.9* 28.2* 27.3* 28.5* 28.4*  MCV 97.9 99.3 99.6 99.7 99.6  PLT 190 196 263 263 595   Basic Metabolic Panel: Recent Labs  Lab 04/15/17 2215 04/16/17 1425 04/17/17 0648 04/19/17 0817  NA 138 136 137 141  K 3.6 3.8 3.5 3.5  CL 99* 101 101 107  CO2 22 22 22 22   GLUCOSE 111* 84 99 92  BUN 30* 38* 16 23*  CREATININE 7.58* 8.29* 5.27* 9.22*  CALCIUM 9.7 8.6* 7.8* 8.1*  PHOS  --  7.5*  --  5.0*   GFR: Estimated Creatinine Clearance: 9.2 mL/min (A) (by C-G formula based on SCr of 9.22 mg/dL (H)). Liver Function Tests: Recent Labs  Lab 04/16/17 0442 04/16/17 1425 04/19/17 0817  AST 17  --   --   ALT 12*  --   --   ALKPHOS 55  --   --   BILITOT 0.8  --   --   PROT 7.7  --   --   ALBUMIN 3.5 2.8* 2.6*   Recent Labs  Lab 04/15/17 2215  LIPASE 44   No results for input(s): AMMONIA in the last 168 hours. Coagulation Profile: No results for input(s): INR, PROTIME in the last 168 hours. Cardiac Enzymes: No results for input(s): CKTOTAL, CKMB, CKMBINDEX, TROPONINI in the last 168 hours. BNP (last 3 results) No results for input(s): PROBNP in the last 8760 hours. HbA1C: No results for input(s): HGBA1C in the last 72 hours. CBG: No results for input(s): GLUCAP in the last 168 hours. Lipid Profile: No results for input(s): CHOL, HDL, LDLCALC, TRIG, CHOLHDL, LDLDIRECT in the last 72 hours. Thyroid Function Tests: No results for input(s): TSH, T4TOTAL, FREET4, T3FREE, THYROIDAB in the last 72 hours. Anemia Panel: No  results for input(s): VITAMINB12, FOLATE, FERRITIN, TIBC, IRON, RETICCTPCT in the last 72 hours. Urine analysis:    Component Value Date/Time   COLORURINE YELLOW 04/16/2017 0518   APPEARANCEUR CLOUDY (A) 04/16/2017 0518   LABSPEC 1.014 04/16/2017 0518   PHURINE 6.0 04/16/2017 0518   GLUCOSEU NEGATIVE 04/16/2017 0518   HGBUR MODERATE (A) 04/16/2017 0518   BILIRUBINUR NEGATIVE 04/16/2017 0518   KETONESUR NEGATIVE 04/16/2017 0518   PROTEINUR 100 (A) 04/16/2017 0518   NITRITE NEGATIVE 04/16/2017 0518   LEUKOCYTESUR LARGE (A) 04/16/2017 0518   Recent Results (from the past 240 hour(s))  Urine Culture     Status: Abnormal   Collection Time: 04/16/17  5:20 AM  Result Value Ref Range Status   Specimen Description URINE, CATHETERIZED  Final   Special Requests NONE  Final   Culture (A)  Final    >=100,000 COLONIES/mL ESCHERICHIA COLI Confirmed Extended Spectrum Beta-Lactamase Producer (ESBL).  In bloodstream infections from ESBL organisms, carbapenems are preferred over piperacillin/tazobactam. They  are shown to have a lower risk of mortality. Performed at [facility], [place] [address], [place], [place] [address]    Report Status 04/18/2017 FINAL  Final   Organism ID, Bacteria ESCHERICHIA COLI (A)  Final      Susceptibility   Escherichia coli - MIC*    AMPICILLIN >=32 RESISTANT Resistant     CEFAZOLIN >=64 RESISTANT Resistant     CEFTRIAXONE >=64 RESISTANT Resistant     CIPROFLOXACIN >=4 RESISTANT Resistant     GENTAMICIN <=1 SENSITIVE Sensitive     IMIPENEM <=0.25 SENSITIVE Sensitive     NITROFURANTOIN <=16 SENSITIVE Sensitive     TRIMETH/SULFA >=320 RESISTANT Resistant     AMPICILLIN/SULBACTAM 16 INTERMEDIATE Intermediate     PIP/TAZO <=4 SENSITIVE Sensitive     Extended ESBL POSITIVE Resistant     * >=100,000 COLONIES/mL ESCHERICHIA COLI  Blood culture (routine x 2)     Status: None (Preliminary result)   Collection Time: 04/16/17  5:21 AM  Result Value Ref Range Status    Specimen Description BLOOD RIGHT FOREARM  Final   Special Requests   Final    BOTTLES DRAWN AEROBIC AND ANAEROBIC Blood Culture adequate volume   Culture   Final    NO GROWTH 4 DAYS Performed at [facility], [address] [place], [place] [address]    Report Status PENDING  Incomplete  Blood culture (routine x 2)     Status: None (Preliminary result)   Collection Time: 04/16/17  5:31 AM  Result Value Ref Range Status   Specimen Description BLOOD RIGHT HAND  Final   Special Requests   Final    BOTTLES DRAWN AEROBIC AND ANAEROBIC Blood Culture adequate volume   Culture   Final    NO GROWTH 4 DAYS Performed at [facility], [place] [address], [place], [place] [address]    Report Status PENDING  Incomplete  C difficile quick scan w PCR reflex     Status: None   Collection Time: 04/17/17  5:11 AM  Result Value Ref Range Status   C Diff antigen NEGATIVE NEGATIVE Final   C Diff toxin NEGATIVE NEGATIVE Final   C Diff interpretation No C. difficile detected.  Final    Comment: Performed at [facility], [place] [address] [place], [place] [address]  Gastrointestinal Panel by PCR , Stool     Status: None   Collection Time: 04/17/17  6:33 PM  Result Value Ref Range Status   Campylobacter species NOT DETECTED NOT DETECTED Final   Plesimonas shigelloides NOT DETECTED NOT DETECTED Final   Salmonella species NOT DETECTED NOT DETECTED Final   Yersinia enterocolitica NOT DETECTED NOT DETECTED Final   Vibrio species NOT DETECTED NOT DETECTED Final   Vibrio cholerae NOT DETECTED NOT DETECTED Final   Enteroaggregative E coli (EAEC) NOT DETECTED NOT DETECTED Final   Enteropathogenic E coli (EPEC) NOT DETECTED NOT DETECTED Final   Enterotoxigenic E coli (ETEC) NOT DETECTED NOT DETECTED Final   Shiga like toxin producing E coli (STEC) NOT DETECTED NOT DETECTED Final   Shigella/Enteroinvasive E coli (EIEC) NOT DETECTED NOT DETECTED Final   Cryptosporidium NOT DETECTED NOT DETECTED  Final   Cyclospora cayetanensis NOT DETECTED NOT DETECTED Final   Entamoeba histolytica NOT DETECTED NOT DETECTED Final   Giardia lamblia NOT DETECTED NOT DETECTED Final   Adenovirus F40/41 NOT DETECTED NOT DETECTED Final   Astrovirus NOT DETECTED NOT DETECTED Final   Norovirus GI/GII NOT DETECTED  NOT DETECTED Final   Rotavirus A NOT DETECTED NOT DETECTED Final   Sapovirus (I, II, IV, and V) NOT DETECTED NOT DETECTED Final    Comment: Performed at [facility], [address], [place], [place] [address]      Radiology Studies: No results found.  Scheduled Meds: . apixaban  5 mg Oral BID  . calcitRIOL  0.75 mcg Oral Daily  . cinacalcet  90 mg Oral Q breakfast  . [START ON 04/21/2017] darbepoetin (ARANESP) injection - DIALYSIS  200 mcg Intravenous Q Thu-HD  . gabapentin  100 mg Oral QHS  . lanthanum  1,000 mg Oral TID WC  . multivitamin  1 tablet Oral Daily  . pantoprazole  40 mg Oral Q1200  . saccharomyces boulardii  250 mg Oral BID  . sertraline  50 mg Oral QHS   Continuous Infusions: . meropenem (MERREM) IV Stopped (04/20/17 1332)     LOS: 4 days   Time spent: 25 minutes.  Vance Gather, MD [facility] Pager [phone]  If 7PM-7AM, please contact night-coverage [email or web address] Password [facility] 04/20/2017, 4:34 PM

## 2017-04-20 NOTE — Progress Notes (Signed)
Pharmacy Antibiotic Note  Judy Kelley is a 52 y.o. female admitted on 04/15/2017 with N/V/D and fever.  Found to have ESBL E.coli pyelonephritis.  Nephrostomy tube recently changed. Pharmacy has been consulted for Merrem dosing.  Patient has ESRD on TTS HD.  Afebrile, WBC WNL.   Plan: Merrem 500mg  IV Q24H Pharmacy will sign off as dosage adjustment is unnecessary.  Thank you for the consult!  Consider adding stop date to order (today is day #5 of therapy).   Height: 5\' 7"  (170.2 cm) Weight: 247 lb 2.2 oz (112.1 kg) IBW/kg (Calculated) : 61.6  Temp (24hrs), Avg:98.2 F (36.8 C), Min:97.9 F (36.6 C), Max:98.4 F (36.9 C)  Recent Labs  Lab 04/15/17 2215 04/16/17 0529 04/16/17 1425 04/16/17 1426 04/17/17 0648 04/18/17 0617 04/19/17 0414 04/19/17 0817  WBC 10.5  --   --  7.8 6.4 5.1 4.4 4.9  CREATININE 7.58*  --  8.29*  --  5.27*  --   --  9.22*  LATICACIDVEN  --  1.27  --   --   --   --   --   --     Estimated Creatinine Clearance: 9.2 mL/min (A) (by C-G formula based on SCr of 9.22 mg/dL (H)).    Allergies  Allergen Reactions  . Ciprofloxacin Other (See Comments)    IV only-burning to her veins, can take PO  . Erythromycin Anaphylaxis  . Chlorhexidine Rash  . Other Rash and Other (See Comments)    chloraprep     Merrem 4/13>>  Vanc 4/13 >> 4/14  4/13 UCx - ESBL E.coli 4/13 BCx - NGTD 4/14 Cdiff - negative 4/14 GI panel PCR - negative   Judy Kelley D. Mina Marble, PharmD, BCPS Pager:  [phone] 04/20/2017, 9:30 AM

## 2017-04-20 NOTE — Progress Notes (Addendum)
[facility] Progress Note   Subjective:   Nausea slightly better.  No vomiting or fever.  Continues to have diarrhea.  Only new med Velphoro, started 1.5weeks ago.  Objective Vitals:   04/19/17 1115 04/19/17 1140 04/19/17 1236 04/19/17 2050  BP: 107/66 105/69 105/65 104/62  Pulse: (!) 52 (!) 54 63 (!) 58  Resp:  18 16 16   Temp:  97.9 F (36.6 C) 98.4 F (36.9 C) 98.2 F (36.8 C)  TempSrc:  Oral Oral Oral  SpO2:  98% 98% 99%  Weight:  112.1 kg (247 lb 2.2 oz)    Height:       Physical Exam General:NAD, WDWN female sitting in bedside chair. Heart:RRR Lungs:CTAB Abdomen:soft, ND, diffuse tenderness, +BS Extremities: trace edema L>R Dialysis Access: R IJ [facility]   Filed Weights   04/16/17 1723 04/19/17 0732 04/19/17 1140  Weight: 115.6 kg (254 lb 13.6 oz) 114.2 kg (251 lb 12.3 oz) 112.1 kg (247 lb 2.2 oz)    Intake/Output Summary (Last 24 hours) at 04/20/2017 1105 Last data filed at 04/20/2017 0900 Gross per 24 hour  Intake 800 ml  Output 2500 ml  Net -1700 ml    Additional Objective Labs: Basic Metabolic Panel: Recent Labs  Lab 04/16/17 1425 04/17/17 0648 04/19/17 0817  NA 136 137 141  K 3.8 3.5 3.5  CL 101 101 107  CO2 22 22 22   GLUCOSE 84 99 92  BUN 38* 16 23*  CREATININE 8.29* 5.27* 9.22*  CALCIUM 8.6* 7.8* 8.1*  PHOS 7.5*  --  5.0*   Liver Function Tests: Recent Labs  Lab 04/16/17 0442 04/16/17 1425 04/19/17 0817  AST 17  --   --   ALT 12*  --   --   ALKPHOS 55  --   --   BILITOT 0.8  --   --   PROT 7.7  --   --   ALBUMIN 3.5 2.8* 2.6*   Recent Labs  Lab 04/15/17 2215  LIPASE 44   CBC: Recent Labs  Lab 04/16/17 1426 04/17/17 0648 04/18/17 0617 04/19/17 0414 04/19/17 0817  WBC 7.8 6.4 5.1 4.4 4.9  HGB 8.7* 8.9* 8.6* 8.6* 8.6*  HCT 27.9* 28.2* 27.3* 28.5* 28.4*  MCV 97.9 99.3 99.6 99.7 99.6  PLT 190 196 263 263 313   Blood Culture    Component Value Date/Time   SDES BLOOD RIGHT HAND 04/16/2017 0531   SPECREQUEST   04/16/2017 0531    BOTTLES DRAWN AEROBIC AND ANAEROBIC Blood Culture adequate volume   CULT  04/16/2017 0531    NO GROWTH 3 DAYS Performed at [facility], [place] [address], [place], [place] [address]    REPTSTATUS PENDING 04/16/2017 0531    Medications: . meropenem (MERREM) IV Stopped (04/19/17 1326)   . apixaban  5 mg Oral BID  . calcitRIOL  0.75 mcg Oral Daily  . cinacalcet  90 mg Oral Q breakfast  . gabapentin  100 mg Oral QHS  . multivitamin  1 tablet Oral Daily  . pantoprazole  40 mg Oral Q1200  . saccharomyces boulardii  250 mg Oral BID  . sertraline  50 mg Oral QHS  . sucroferric oxyhydroxide  500 mg Oral TID WC    Dialysis Orders: [facility] 4h  400/800  113kg  2/2 bath  R IJ TDC (L thigh AVG clotted recently, not using anymore)  Heparin 4000 bolus then 500 u/ hr prn -Venofer 50mg  IV qwk -mircera 161mcg IV q2wks - last 4/11 -Calcitriol  0.75mcg PO qHD  -Last Labs:4/11:Hgb10.2, P 7.8, Ca 8.4,3/28:TSAT31%, K4.3, PTH846, Albumin4.2   Assessment/Plan: 1. Pyelonephritis / R kidney/ chronic indwelling R PCN: +CT changes, +UCx +EColi. On ABX. IR consulted, no drain exchange planned for now. 2. Diarrhea/ nausea/ vomiting - C diff negative. May be a little better.  Try changing binder to Fosrenol to see if diarrhea related to Velphoro. Will hold binders for now, see if GI issues may be due to this.  3. ESRD - continue per regular TTS schedule. Orders written for tomorrow. 4. Recurrent clotting of HD accesses: had early clotting of AVG's in bilat UE's and more recently clotting of R thigh AVG placed 03/21/17 despite Eliquis Rx.  Not L thigh AVG candidate due to L thigh DVT. Seen by [facility] on 4/3, is now [facility] dependent.   5. Hypotension/volume - chronic hypotension, BP close to baseline. Does not appear volume overloaded. Continue to titrate down volume as tolerated. Under EDW, will need to eval EDW at d/c. 6. Anemia of chronic disease-Hgb 8.6, Start aranesp 248mcg IV  qwk tomorrow.  7. Secondary Hyperparathyroidism - CCa 9.7, P5, continue  VDRA. D/c velphoro. Start Fosrenol 1AC TID and see if improvement in diarrhea.  8. Nutrition - Alb 2.6, renal diet with fluid restrictions. Renavite. Add Nepro 9. VTE - chronic anticoagulation - per primary   Jen Mow, PA-C [facility] Pager: [phone] 04/20/2017,11:05 AM  LOS: 4 days   Pt seen, examined and agree w A/P as above.  Kelly Splinter MD [employer] pager [phone]   04/20/2017, 12:43 PM

## 2017-04-20 NOTE — Plan of Care (Signed)
  Problem: Health Behavior/Discharge Planning: Goal: Ability to manage health-related needs will improve Outcome: Progressing   Problem: Elimination: Goal: Will not experience complications related to bowel motility Outcome: Progressing   

## 2017-04-21 LAB — RENAL FUNCTION PANEL
ALBUMIN: 2.8 g/dL — AB (ref 3.5–5.0)
Albumin: 2.8 g/dL — ABNORMAL LOW (ref 3.5–5.0)
Anion gap: 11 (ref 5–15)
Anion gap: 11 (ref 5–15)
BUN: 22 mg/dL — ABNORMAL HIGH (ref 6–20)
BUN: 22 mg/dL — ABNORMAL HIGH (ref 6–20)
CALCIUM: 8.6 mg/dL — AB (ref 8.9–10.3)
CHLORIDE: 106 mmol/L (ref 101–111)
CO2: 23 mmol/L (ref 22–32)
CO2: 24 mmol/L (ref 22–32)
Calcium: 8.6 mg/dL — ABNORMAL LOW (ref 8.9–10.3)
Chloride: 105 mmol/L (ref 101–111)
Creatinine, Ser: 7.69 mg/dL — ABNORMAL HIGH (ref 0.44–1.00)
Creatinine, Ser: 7.8 mg/dL — ABNORMAL HIGH (ref 0.44–1.00)
GFR, EST AFRICAN AMERICAN: 6 mL/min — AB (ref >60)
GFR, EST AFRICAN AMERICAN: 6 mL/min — AB (ref >60)
GFR, EST NON AFRICAN AMERICAN: 5 mL/min — AB (ref >60)
GFR, EST NON AFRICAN AMERICAN: 5 mL/min — AB (ref >60)
Glucose, Bld: 98 mg/dL (ref 65–99)
Glucose, Bld: 94 mg/dL (ref 65–99)
PHOSPHORUS: 4.6 mg/dL (ref 2.5–4.6)
POTASSIUM: 3.7 mmol/L (ref 3.5–5.1)
Phosphorus: 4.4 mg/dL (ref 2.5–4.6)
Potassium: 3.8 mmol/L (ref 3.5–5.1)
SODIUM: 140 mmol/L (ref 135–145)
Sodium: 140 mmol/L (ref 135–145)

## 2017-04-21 LAB — CULTURE, BLOOD (ROUTINE X 2)
CULTURE: NO GROWTH
CULTURE: NO GROWTH
SPECIAL REQUESTS: ADEQUATE
SPECIAL REQUESTS: ADEQUATE

## 2017-04-21 LAB — CBC
HCT: 31.6 % — ABNORMAL LOW (ref 36.0–46.0)
Hemoglobin: 9.6 g/dL — ABNORMAL LOW (ref 12.0–15.0)
MCH: 30.5 pg (ref 26.0–34.0)
MCHC: 30.4 g/dL (ref 30.0–36.0)
MCV: 100.3 fL — ABNORMAL HIGH (ref 78.0–100.0)
Platelets: 308 10*3/uL (ref 150–400)
RBC: 3.15 MIL/uL — AB (ref 3.87–5.11)
RDW: 15.7 % — ABNORMAL HIGH (ref 11.5–15.5)
WBC: 5.3 10*3/uL (ref 4.0–10.5)

## 2017-04-21 MED ORDER — CALCITRIOL 0.25 MCG PO CAPS
ORAL_CAPSULE | ORAL | Status: AC
  Filled 2017-04-21: qty 1

## 2017-04-21 MED ORDER — PENTAFLUOROPROP-TETRAFLUOROETH EX AERO: 1.0000 "application " | INHALATION_SPRAY | CUTANEOUS | Status: DC | PRN

## 2017-04-21 MED ORDER — HEPARIN SODIUM (PORCINE) 1000 UNIT/ML DIALYSIS: 4000.0000 [IU] | INTRAMUSCULAR | Status: DC | PRN

## 2017-04-21 MED ORDER — DARBEPOETIN ALFA 200 MCG/0.4ML IJ SOSY
PREFILLED_SYRINGE | INTRAMUSCULAR | Status: AC
  Administered 2017-04-21: 200 ug via INTRAVENOUS
  Filled 2017-04-21: qty 0.4

## 2017-04-21 MED ORDER — SODIUM CHLORIDE 0.9 % IV SOLN: 100.0000 mL | INTRAVENOUS | Status: DC | PRN

## 2017-04-21 MED ORDER — HEPARIN SODIUM (PORCINE) 1000 UNIT/ML DIALYSIS: 1000.0000 [IU] | INTRAMUSCULAR | Status: DC | PRN

## 2017-04-21 MED ORDER — ALTEPLASE 2 MG IJ SOLR: 2.0000 mg | Freq: Once | INTRAMUSCULAR | Status: DC | PRN

## 2017-04-21 MED ORDER — LIDOCAINE HCL (PF) 1 % IJ SOLN: 5.0000 mL | INTRAMUSCULAR | Status: DC | PRN

## 2017-04-21 MED ORDER — LIDOCAINE-PRILOCAINE 2.5-2.5 % EX CREA
1.0000 "application " | TOPICAL_CREAM | CUTANEOUS | Status: DC | PRN
  Filled 2017-04-21: qty 5

## 2017-04-21 MED ORDER — CALCITRIOL 0.5 MCG PO CAPS
ORAL_CAPSULE | ORAL | Status: AC
  Filled 2017-04-21: qty 1

## 2017-04-21 NOTE — Progress Notes (Signed)
Referring Physician(s): Dr Rudolpho Sevin Dr Keene Breath  Supervising Physician: Dr. 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  calcitRIOL (ROCALTROL) 0.25 MCG capsule, , , ,  .  calcitRIOL (ROCALTROL) 0.5 MCG capsule, , , ,  .  Darbepoetin Alfa (ARANESP) 200 MCG/0.4ML injection, , , ,  .  0.9 %  sodium chloride infusion, 100 mL, Intravenous, PRN, Penninger, Lindsay, PA .  0.9 %  sodium chloride infusion, 100 mL, Intravenous, PRN, Penninger, Lindsay, PA .  acetaminophen (TYLENOL) tablet 1,000 mg, 1,000 mg, Oral, Q6H PRN, Bonnell Public Tublu, MD, 1,000 mg at 04/19/17 2127 .  alteplase (CATHFLO ACTIVASE) injection 2 mg, 2 mg, Intracatheter, Once PRN, Penninger, Ria Comment, PA .  apixaban (ELIQUIS) tablet 5 mg, 5 mg, Oral, BID, Dang, Thuy D, RPH, 5 mg at 04/20/17 2110 .  calcitRIOL (ROCALTROL) capsule 0.75 mcg, 0.75 mcg, Oral, Daily, Bonnell Public Tublu, MD, 0.75 mcg at 04/20/17 0858 .  cinacalcet (SENSIPAR) tablet 90 mg, 90 mg, Oral, Q breakfast, Bonnell Public Tublu, MD, 90 mg at 04/20/17 0857 .  Darbepoetin Alfa (ARANESP) injection 200 mcg, 200 mcg, Intravenous, Q Thu-HD, Penninger, Lindsay, PA .  gabapentin (NEURONTIN) capsule 100 mg, 100 mg, Oral, QHS, Chatterjee, Srobona Tublu, MD, 100 mg at 04/20/17 2110 .  heparin injection 1,000 Units, 1,000 Units, Dialysis, PRN, Penninger, Ria Comment, PA .  heparin injection 4,000 Units, 4,000 Units, Dialysis, PRN, Penninger, Ria Comment, PA .  lanthanum (FOSRENOL) chewable tablet 1,000 mg, 1,000 mg, Oral, TID WC, Penninger, Lindsay, PA, 1,000 mg at 04/20/17 1646 .  lidocaine (PF)  (XYLOCAINE) 1 % injection 5 mL, 5 mL, Intradermal, PRN, Penninger, Ria Comment, PA .  lidocaine-prilocaine (EMLA) cream 1 application, 1 application, Topical, PRN, Penninger, Ria Comment, PA .  meropenem (MERREM) 500 mg in sodium chloride 0.9 % 100 mL IVPB, 500 mg, Intravenous, Q24H, Jalene Mullet, RPH, Stopped at 04/20/17 1332 .  multivitamin (RENA-VIT) tablet 1 tablet, 1 tablet, Oral, Daily, Vashti Hey, MD, 1 tablet at 04/20/17 0858 .  mupirocin ointment (BACTROBAN) 2 % 1 application, 1 application, Topical, BID PRN, Bonnell Public Tublu, MD .  pantoprazole (PROTONIX) EC tablet 40 mg, 40 mg, Oral, Q1200, Regalado, Belkys A, MD, 40 mg at 04/20/17 1301 .  pentafluoroprop-tetrafluoroeth (GEBAUERS) aerosol 1 application, 1 application, Topical, PRN, Penninger, Ria Comment, PA .  polyethylene glycol (MIRALAX / GLYCOLAX) packet 17 g, 17 g, Oral, Daily PRN, Bonnell Public Tublu, MD .  promethazine (PHENERGAN) injection 12.5 mg, 12.5 mg, Intravenous, Q6H PRN, Regalado, Belkys A, MD, 12.5 mg at 04/19/17 2127 .  saccharomyces boulardii (FLORASTOR) capsule 250 mg, 250 mg, Oral, BID, Regalado, Belkys A, MD, 250 mg at 04/20/17 2110 .  sertraline (ZOLOFT) tablet 50 mg, 50 mg, Oral, QHS, Bonnell Public Tublu, MD, 50 mg at 04/20/17 2110    Vital Signs: BP 99/65   Pulse (!) 56   Temp 98 F (36.7 C) (Oral)   Resp 14   Ht 5\' 7"  (1.702 m)   Wt 252 lb 6.8 oz (114.5 kg)   SpO2 95%   BMI 39.54 kg/m   Physical Exam  Abdominal: Normal appearance.  Skin: Skin is warm and dry.  Site at [place] is  clean and dry NT No bleeding no sign of infection Good clear UOP    Imaging: Dg Chest 2 View  Result Date: 04/17/2017 CLINICAL DATA:  Shortness of breath. EXAM: CHEST - 2 VIEW COMPARISON:  Chest x-ray dated April 15, 2017. FINDINGS: Unchanged tunneled right internal jugular dialysis catheter with the tip in the right atrium. The heart size and mediastinal contours are within normal limits.  Normal pulmonary vascularity. No focal consolidation, pleural effusion, or pneumothorax. Unchanged right axillary vascular stents. No acute osseous abnormality. IMPRESSION: No active cardiopulmonary disease. Electronically Signed   By: Titus Dubin M.D.   On: 04/17/2017 17:21    Labs:  CBC: Recent Labs    04/18/17 0617 04/19/17 0414 04/19/17 0817 04/21/17 0730  WBC 5.1 4.4 4.9 5.3  HGB 8.6* 8.6* 8.6* 9.6*  HCT 27.3* 28.5* 28.4* 31.6*  PLT 263 263 313 308    COAGS: Recent Labs    12/03/16 1818  12/08/16 0734 03/21/17 0642 03/21/17 1630 04/16/17 2014 04/17/17 0648 04/17/17 1705  INR 1.14  --   --  1.05 0.97  --   --   --   APTT  --    < > 77*  --   --  41* 49* 83*   < > = values in this interval not displayed.    BMP: Recent Labs    04/17/17 0648 04/19/17 0817 04/21/17 0550 04/21/17 0730  NA 137 141 140 140  K 3.5 3.5 3.7 3.8  CL 101 107 106 105  CO2 22 22 23 24   GLUCOSE 99 92 98 94  BUN 16 23* 22* 22*  CALCIUM 7.8* 8.1* 8.6* 8.6*  CREATININE 5.27* 9.22* 7.69* 7.80*  GFRNONAA 9* 4* 5* 5*  GFRAA 10* 5* 6* 6*    LIVER FUNCTION TESTS: Recent Labs    12/03/16 1818 03/21/17 1630 04/16/17 0442 04/16/17 1425 04/19/17 0817 04/21/17 0550 04/21/17 0730  BILITOT 0.5 0.8 0.8  --   --   --   --   AST 41 21 17  --   --   --   --   ALT 21 8* 12*  --   --   --   --   ALKPHOS 137* 40 55  --   --   --   --   PROT 8.1 6.6 7.7  --   --   --   --   ALBUMIN 3.1* 3.6 3.5 2.8* 2.6* 2.8* 2.8*    Assessment and Plan: Rt PCN intact OP clear yellow afeb Wbc wnl Better daily Plan per Dr Jeffie Pollock Call IR if PCN issues. Otherwise pt to be scheduled for routine outpt PCN exchange.  Electronically Signed: Ascencion Dike, PA-C 04/21/2017, 10:09 AM   I spent a total of 15 Minutes at the the patient's bedside AND on the patient's hospital floor or unit, greater than 50% of which was counseling/coordinating care for Rt PCN

## 2017-04-21 NOTE — Progress Notes (Signed)
PROGRESS NOTE  Judy Kelley  JKK:[id number] DOB: 1965-03-19 DOA: 04/15/2017 PCP: System, Pcp Not In   Brief Narrative: Judy Kelley is a 52 y.o. 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 Patient underwent change out of her nephrostomy tube 3 days PTA and noted abdominal pain, nausea, vomiting, diarrhea and fevers. Urinalysis suggested UTI and CT demonstrated acute right pyelonephritis. Nephrology was consulted, supervising HD while inpatient. Urine culture has grown ESBL E. coli, treated with meropenem. Diarrhea has continued, so binders were changed by renal.   Assessment & Plan: Principal Problem:   Pyelonephritis Active Problems:   ESRD on dialysis [facility])   Chronic anticoagulation   History of adverse effect of venous thromboembolism (VTE) prophylaxis   Depression  Acute right pyelonephritis due to ESBL E. coli: Complicated by right nephrostomy tube (exchanged 4/10). - Continuing meropenem, discussed definitive abx therapy with ID, Dr. Baxter Flattery, who is discussing options with pharmacy. Upper UTI not a candidate for macrobid, fosfomycin. Poor access complicates prolonged IV Tx, but will need at least 10 days with first day of therapy 4/14. - Per IR, no repeat exchange of nephrostomy tube is indicated.   ESRD:  - Continue TTS HD through right [facility] per nephrology.  Recurrent VTE: Including DVT despite eliquis.  - Started on heparin initially, and converted back to home eliquis 4/16 with no procedures planned.  - Has follow up with vascular surgery at [facility] as she is current TDC-dependent  Diarrhea: N/V have subsided, and diarrhea  improved, but remains still. CDiff, GI pathogen panel negative. No colitis on CT.  - Continue florastor for antibiotic-associated component - Holding binder per nephrology  Anemia of chronic disease: Hgb stable at 8.6-8.9.  - Aranesp per renal, monitor CBC prn bleeding and intermittently.   Depression: Mood is stable - Continue home medication  DVT prophylaxis: Eliquis Code Status: Full Family Communication: None at bedside Disposition Plan: Home when improved.  Consultants: Nephrology. ID by phone, Dr. Baxter Flattery. Procedures: Routine HD  Antimicrobials:  Vancomycin, zosyn 4/12  Meropenem 4/13 >>   Subjective: Diarrhea continues with every urination. No bleeding. Nausea is mild, no emesis, able to tolerate po regularly, no abd pain. No fevers, chills, back pain.   Objective: BP 115/65 (BP Location: Right Arm)   Pulse 76   Temp 97.9 F (36.6 C) (Oral)   Resp 18   Ht 5\' 7"  (1.702 m)   Wt 114.5 kg (252 lb 6.8 oz)   SpO2 96%   BMI 39.54 kg/m   Gen: Pleasant, obese female in no distress Pulm: Non-labored breathing room air. Clear to auscultation bilaterally.  CV: Regular rate and rhythm. No murmur, rub, or gallop. No JVD, Left 1+ nonpitting pedal edema. GI: Abdomen soft, non-tender, non-distended, with normoactive bowel sounds. No organomegaly or masses felt. Ext: Warm, no deformities. Upper extremity grafts without tenderness, bruit, or thrill. Right femoral graft palpated without thrill, bruit, or tenderness. Left forearm PIV intact.  Skin: Right posterior PCN site without significant erythema, discharge, tenderness, induration. Abdominal abrasion with granulation tissue, scant sanguinous discharge on bandage, stable. Right TDC site c/d/i with mild tenderness but no erythema, fluctuance or discharge.  Neuro: Alert and oriented. No focal neurological deficits.  Psych: Judgement and insight appear normal. Mood & affect appropriate.   CBC: Recent Labs  Lab 04/17/17 0648  04/18/17 0617 04/19/17 0414 04/19/17 0817 04/21/17 0730  WBC 6.4 5.1 4.4 4.9 5.3  HGB 8.9* 8.6* 8.6* 8.6* 9.6*  HCT 28.2* 27.3* 28.5* 28.4* 31.6*  MCV 99.3 99.6 99.7 99.6 100.3*  PLT 196 263 263 313 425   Basic Metabolic Panel: Recent Labs  Lab 04/16/17 1425 04/17/17 0648 04/19/17 0817 04/21/17 0550 04/21/17 0730  NA 136 137 141 140 140  K 3.8 3.5 3.5 3.7 3.8  CL 101 101 107 106 105  CO2 22 22 22 23 24   GLUCOSE 84 99 92 98 94  BUN 38* 16 23* 22* 22*  CREATININE 8.29* 5.27* 9.22* 7.69* 7.80*  CALCIUM 8.6* 7.8* 8.1* 8.6* 8.6*  PHOS 7.5*  --  5.0* 4.4 4.6   Liver Function Tests: Recent Labs  Lab 04/16/17 0442 04/16/17 1425 04/19/17 0817 04/21/17 0550 04/21/17 0730  AST 17  --   --   --   --   ALT 12*  --   --   --   --   ALKPHOS 55  --   --   --   --   BILITOT 0.8  --   --   --   --   PROT 7.7  --   --   --   --   ALBUMIN 3.5 2.8* 2.6* 2.8* 2.8*   Recent Labs  Lab 04/15/17 2215  LIPASE 44   Urine analysis:    Component Value Date/Time   COLORURINE YELLOW 04/16/2017 0518   APPEARANCEUR CLOUDY (A) 04/16/2017 0518   LABSPEC 1.014 04/16/2017 0518   PHURINE 6.0 04/16/2017 0518   GLUCOSEU NEGATIVE 04/16/2017 0518   HGBUR MODERATE (A) 04/16/2017 0518   BILIRUBINUR NEGATIVE 04/16/2017 0518   KETONESUR NEGATIVE 04/16/2017 0518   PROTEINUR 100 (A) 04/16/2017 0518   NITRITE NEGATIVE 04/16/2017 0518   LEUKOCYTESUR LARGE (A) 04/16/2017 0518   Recent Results (from the past 240 hour(s))  Urine Culture     Status: Abnormal   Collection Time: 04/16/17  5:20 AM  Result Value Ref Range Status   Specimen Description URINE, CATHETERIZED  Final   Special Requests NONE  Final   Culture (A)  Final    >=100,000 COLONIES/mL ESCHERICHIA COLI Confirmed Extended Spectrum Beta-Lactamase Producer (ESBL).  In bloodstream infections from ESBL organisms, carbapenems are preferred over piperacillin/tazobactam. They are shown to have a lower risk of mortality. Performed at [facility], [place] [address] [place], [place] [address]    Report Status 04/18/2017 FINAL  Final   Organism ID, Bacteria ESCHERICHIA COLI (A)  Final      Susceptibility   Escherichia coli - MIC*    AMPICILLIN >=32 RESISTANT Resistant     CEFAZOLIN >=64 RESISTANT Resistant     CEFTRIAXONE >=64 RESISTANT Resistant     CIPROFLOXACIN >=4 RESISTANT Resistant     GENTAMICIN <=1 SENSITIVE Sensitive     IMIPENEM <=0.25 SENSITIVE Sensitive     NITROFURANTOIN <=16 SENSITIVE Sensitive     TRIMETH/SULFA >=320 RESISTANT Resistant     AMPICILLIN/SULBACTAM 16 INTERMEDIATE Intermediate     PIP/TAZO <=4 SENSITIVE Sensitive     Extended ESBL POSITIVE Resistant     * >=100,000 COLONIES/mL ESCHERICHIA COLI  Blood culture (routine x 2)     Status: None   Collection Time: 04/16/17  5:21 AM  Result Value Ref Range Status   Specimen Description BLOOD RIGHT  FOREARM  Final   Special Requests   Final    BOTTLES DRAWN AEROBIC AND ANAEROBIC Blood Culture adequate volume   Culture   Final    NO GROWTH 5 DAYS Performed at [facility], [address] [place], [place] [address]    Report Status 04/21/2017 FINAL  Final  Blood culture (routine x 2)     Status: None   Collection Time: 04/16/17  5:31 AM  Result Value Ref Range Status   Specimen Description BLOOD RIGHT HAND  Final   Special Requests   Final    BOTTLES DRAWN AEROBIC AND ANAEROBIC Blood Culture adequate volume   Culture   Final    NO GROWTH 5 DAYS Performed at [facility], [place] [address] [place], [place] [address]    Report Status 04/21/2017 FINAL  Final  C difficile quick scan w PCR reflex     Status: None   Collection Time: 04/17/17  5:11 AM  Result Value Ref Range Status   C Diff antigen NEGATIVE NEGATIVE Final   C Diff toxin NEGATIVE NEGATIVE Final   C Diff interpretation No C. difficile detected.  Final    Comment: Performed at [facility], [place] [address] [place], [place] [address]  Gastrointestinal Panel by PCR  , Stool     Status: None   Collection Time: 04/17/17  6:33 PM  Result Value Ref Range Status   Campylobacter species NOT DETECTED NOT DETECTED Final   Plesimonas shigelloides NOT DETECTED NOT DETECTED Final   Salmonella species NOT DETECTED NOT DETECTED Final   Yersinia enterocolitica NOT DETECTED NOT DETECTED Final   Vibrio species NOT DETECTED NOT DETECTED Final   Vibrio cholerae NOT DETECTED NOT DETECTED Final   Enteroaggregative E coli (EAEC) NOT DETECTED NOT DETECTED Final   Enteropathogenic E coli (EPEC) NOT DETECTED NOT DETECTED Final   Enterotoxigenic E coli (ETEC) NOT DETECTED NOT DETECTED Final   Shiga like toxin producing E coli (STEC) NOT DETECTED NOT DETECTED Final   Shigella/Enteroinvasive E coli (EIEC) NOT DETECTED NOT DETECTED Final   Cryptosporidium NOT DETECTED NOT DETECTED Final   Cyclospora cayetanensis NOT DETECTED NOT DETECTED Final   Entamoeba histolytica NOT DETECTED NOT DETECTED Final   Giardia lamblia NOT DETECTED NOT DETECTED Final   Adenovirus F40/41 NOT DETECTED NOT DETECTED Final   Astrovirus NOT DETECTED NOT DETECTED Final   Norovirus GI/GII NOT DETECTED NOT DETECTED Final   Rotavirus A NOT DETECTED NOT DETECTED Final   Sapovirus (I, II, IV, and V) NOT DETECTED NOT DETECTED Final    Comment: Performed at [facility], [address], [place], [place] [address]      Radiology Studies: No results found.  Scheduled Meds: . apixaban  5 mg Oral BID  . calcitRIOL  0.75 mcg Oral Daily  . cinacalcet  90 mg Oral Q breakfast  . darbepoetin (ARANESP) injection - DIALYSIS  200 mcg Intravenous Q Thu-HD  . gabapentin  100 mg Oral QHS  . lanthanum  1,000 mg Oral TID WC  . multivitamin  1 tablet Oral Daily  . pantoprazole  40 mg Oral Q1200  . saccharomyces boulardii  250 mg Oral BID  . sertraline  50 mg Oral QHS   Continuous Infusions: . meropenem (MERREM) IV Stopped (04/21/17 1345)     LOS: 5 days   Time spent: 25 minutes.  Vance Gather,  MD [facility] Pager [phone]  If 7PM-7AM, please contact night-coverage [email or web address] Password [facility] 04/21/2017,  2:54 PM

## 2017-04-21 NOTE — Progress Notes (Addendum)
[facility] Progress Note   Subjective:  Seen on HD.  Diarrhea improving.  No fevers. Sleep well, but tired this AM.    Objective Vitals:   04/21/17 0438 04/21/17 0709 04/21/17 0713 04/21/17 0730  BP: (!) 98/59 115/72 110/69 98/67  Pulse: (!) 58 (!) 57 (!) 52 (!) 51  Resp: 12 14    Temp: 97.7 F (36.5 C) 98 F (36.7 C)    TempSrc: Oral Oral    SpO2: 95% 95%    Weight:  114.5 kg (252 lb 6.8 oz)    Height:       Physical Exam General:NAD, WDWN obese female laying in bed on HD Heart:RRR Lungs:CTAB Abdomen:soft, ND, minimal tenderness Extremities:trace edema Dialysis Access: TDC in use   Filed Weights   04/19/17 0732 04/19/17 1140 04/21/17 0709  Weight: 114.2 kg (251 lb 12.3 oz) 112.1 kg (247 lb 2.2 oz) 114.5 kg (252 lb 6.8 oz)    Intake/Output Summary (Last 24 hours) at 04/21/2017 0819 Last data filed at 04/20/2017 2104 Gross per 24 hour  Intake 480 ml  Output 500 ml  Net -20 ml    Additional Objective Labs: Basic Metabolic Panel: Recent Labs  Lab 04/16/17 1425 04/17/17 0648 04/19/17 0817 04/21/17 0550  NA 136 137 141 140  K 3.8 3.5 3.5 3.7  CL 101 101 107 106  CO2 22 22 22 23   GLUCOSE 84 99 92 98  BUN 38* 16 23* 22*  CREATININE 8.29* 5.27* 9.22* 7.69*  CALCIUM 8.6* 7.8* 8.1* 8.6*  PHOS 7.5*  --  5.0* 4.4   Liver Function Tests: Recent Labs  Lab 04/16/17 0442 04/16/17 1425 04/19/17 0817 04/21/17 0550  AST 17  --   --   --   ALT 12*  --   --   --   ALKPHOS 55  --   --   --   BILITOT 0.8  --   --   --   PROT 7.7  --   --   --   ALBUMIN 3.5 2.8* 2.6* 2.8*   Recent Labs  Lab 04/15/17 2215  LIPASE 44   CBC: Recent Labs  Lab 04/16/17 1426 04/17/17 0648 04/18/17 0617 04/19/17 0414 04/19/17 0817  WBC 7.8 6.4 5.1 4.4 4.9  HGB 8.7* 8.9* 8.6* 8.6* 8.6*  HCT 27.9* 28.2* 27.3* 28.5* 28.4*  MCV 97.9 99.3 99.6 99.7 99.6  PLT 190 196 263 263 313   Medications: . sodium chloride    . sodium chloride    . meropenem (MERREM) IV  Stopped (04/20/17 1332)   . apixaban  5 mg Oral BID  . calcitRIOL  0.75 mcg Oral Daily  . cinacalcet  90 mg Oral Q breakfast  . darbepoetin (ARANESP) injection - DIALYSIS  200 mcg Intravenous Q Thu-HD  . gabapentin  100 mg Oral QHS  . lanthanum  1,000 mg Oral TID WC  . multivitamin  1 tablet Oral Daily  . pantoprazole  40 mg Oral Q1200  . saccharomyces boulardii  250 mg Oral BID  . sertraline  50 mg Oral QHS    Dialysis Orders: [facility]ast 4h 400/800 113kg 2/2 bath R IJ TDC (L thigh AVG clotted recently, not using anymore) Heparin 4000 bolus then 500 u/ hr prn -Venofer 50mg  IV qwk -mircera 172mcg IV q2wks - last 4/11 -Calcitriol 0.32mcg PO qHD  -Last Labs:4/11:Hgb10.2, P 7.8, Ca 8.4,3/28:TSAT31%, K4.3, UMP536, Albumin4.2  Assessment/Plan: 1. Pyelonephritis / R kidney/ chronic indwelling R PCN: +CT changes, +UCx +EColi. On  ABX. IR consulted, no drain exchange planned for now. 2. Diarrhea/ nausea/ vomiting- C diff negative. Improving. Holding binders as possible source.   3. ESRD - HD today.  Continue per regular schedule while admitted. 4. Recurrent clotting of HD accesses: had early clotting of AVG's in bilat UE's and more recently clotting of R thigh AVG placed 03/21/17 despite Eliquis Rx. Not L thigh AVG candidate due to L thigh DVT. Seen by [facility] on 4/3, isnow TDC dependent.  5. Hypotension/volume -chronic hypotension, BP close to baseline.  Net UF goal 2.5L,  will continue to titrate down volume as tolerated. Under EDW, will need to eval EDW at d/c. 6. Anemia of chronic disease-Hgb ^9.6, Starting aranesp 229mcg IV qwk today.  7. Secondary Hyperparathyroidism - CCa 9.6, P4.4, continue  VDRA. D/c velphoro. Will restart another binder either while admitted or at d/c. 8. Nutrition - Alb 2.8, renal diet with fluid restrictions. Renavite. Add Nepro 9. VTE - chronic anticoagulation - per primary   Jen Mow, PA-C [facility] Pager:  [phone] 04/21/2017,8:19 AM  LOS: 5 days   Pt seen, examined and agree w A/P as above.  Kelly Splinter MD [employer] pager [phone]   04/21/2017, 12:04 PM

## 2017-04-22 LAB — CBC
HCT: 33.3 % — ABNORMAL LOW (ref 36.0–46.0)
Hemoglobin: 10.1 g/dL — ABNORMAL LOW (ref 12.0–15.0)
MCH: 30.2 pg (ref 26.0–34.0)
MCHC: 30.3 g/dL (ref 30.0–36.0)
MCV: 99.7 fL (ref 78.0–100.0)
PLATELETS: 275 10*3/uL (ref 150–400)
RBC: 3.34 MIL/uL — AB (ref 3.87–5.11)
RDW: 15.6 % — AB (ref 11.5–15.5)
WBC: 6.2 10*3/uL (ref 4.0–10.5)

## 2017-04-22 MED ORDER — ONDANSETRON HCL 4 MG/2ML IJ SOLN
4.0000 mg | Freq: Three times a day (TID) | INTRAMUSCULAR | Status: DC | PRN
  Administered 2017-04-22: 4 mg via INTRAVENOUS
  Filled 2017-04-22: qty 2

## 2017-04-22 MED ORDER — LOPERAMIDE HCL 2 MG PO CAPS
2.0000 mg | ORAL_CAPSULE | ORAL | Status: DC | PRN
  Filled 2017-04-22: qty 1

## 2017-04-22 NOTE — Progress Notes (Signed)
[facility] Progress Note   Subjective:  Seen in room.  Diarrhea persists, N/V better.  Objective Vitals:   04/21/17 1114 04/21/17 1351 04/21/17 2035 04/22/17 0551  BP: 103/64 115/65 111/70 (!) 100/57  Pulse: (!) 54 76 78 63  Resp: 16 18 16 12   Temp: 97.9 F (36.6 C) 97.9 F (36.6 C)  98.2 F (36.8 C)  TempSrc: Oral Oral Oral Oral  SpO2: 96% 96% 98% 96%  Weight: 112.2 kg (247 lb 5.7 oz)     Height:       Physical Exam General:NAD, WDWN obese female laying in bed on HD Heart:RRR Lungs:CTAB Abdomen:soft, ND, minimal tenderness Extremities:trace edema Dialysis Access: [facility] in use   Filed Weights   04/19/17 1140 04/21/17 0709 04/21/17 1114  Weight: 112.1 kg (247 lb 2.2 oz) 114.5 kg (252 lb 6.8 oz) 112.2 kg (247 lb 5.7 oz)    Intake/Output Summary (Last 24 hours) at 04/22/2017 1121 Last data filed at 04/22/2017 0900 Gross per 24 hour  Intake 820 ml  Output -  Net 820 ml    Additional Objective Labs: Basic Metabolic Panel: Recent Labs  Lab 04/19/17 0817 04/21/17 0550 04/21/17 0730  NA 141 140 140  K 3.5 3.7 3.8  CL 107 106 105  CO2 22 23 24   GLUCOSE 92 98 94  BUN 23* 22* 22*  CREATININE 9.22* 7.69* 7.80*  CALCIUM 8.1* 8.6* 8.6*  PHOS 5.0* 4.4 4.6   Liver Function Tests: Recent Labs  Lab 04/16/17 0442  04/19/17 0817 04/21/17 0550 04/21/17 0730  AST 17  --   --   --   --   ALT 12*  --   --   --   --   ALKPHOS 55  --   --   --   --   BILITOT 0.8  --   --   --   --   PROT 7.7  --   --   --   --   ALBUMIN 3.5   < > 2.6* 2.8* 2.8*   < > = values in this interval not displayed.   Recent Labs  Lab 04/15/17 2215  LIPASE 44   CBC: Recent Labs  Lab 04/18/17 0617 04/19/17 0414 04/19/17 0817 04/21/17 0730 04/22/17 0606  WBC 5.1 4.4 4.9 5.3 6.2  HGB 8.6* 8.6* 8.6* 9.6* 10.1*  HCT 27.3* 28.5* 28.4* 31.6* 33.3*  MCV 99.6 99.7 99.6 100.3* 99.7  PLT 263 263 313 308 275   Medications: . meropenem (MERREM) IV Stopped (04/21/17 1345)    . apixaban  5 mg Oral BID  . calcitRIOL  0.75 mcg Oral Daily  . cinacalcet  90 mg Oral Q breakfast  . darbepoetin (ARANESP) injection - DIALYSIS  200 mcg Intravenous Q Thu-HD  . gabapentin  100 mg Oral QHS  . lanthanum  1,000 mg Oral TID WC  . multivitamin  1 tablet Oral Daily  . pantoprazole  40 mg Oral Q1200  . saccharomyces boulardii  250 mg Oral BID  . sertraline  50 mg Oral QHS    Dialysis Orders: [facility] 4h 400/800 113kg 2/2 bath R IJ TDC (L thigh AVG clotted recently, not using anymore) Heparin 4000 bolus then 500 u/ hr prn -Venofer 50mg  IV qwk -mircera 133mcg IV q2wks - last 4/11 -Calcitriol 0.35mcg PO qHD  -Last Labs:4/11:Hgb10.2, P 7.8, Ca 8.4,3/28:TSAT31%, K4.3, TKZ601, Albumin4.2  Assessment/Plan: 1. Right sided pyelo/ chronic R PCN: +CT changes, +UCx +resistant EColi. On ABX. ID evaluating.  2.  Diarrhea/ nausea/ vomiting- C diff negative, persistent issue, on probiotics.  Holding binders as possible source.   3. ESRD - HD TTS. Plan HD tomorrow. 4. Recurrent HD access failures: had early clotting of AVG's in bilat UE's and more recently clotting of R thigh AVG placed 03/21/17 despite Eliquis Rx. Per [facility], not L thigh AVG candidate due to L thigh DVT. Seen by [facility] on 4/3, isnow TDC dependent.  5. Hypotension/volume -chronic hypotension. Close to dry wt, stable 6. Anemia of chronic disease-Hgb ^9.6, started aranesp 260mcg on 4/18 7. MBD ckd  - CCa 9.6, P4.4, continue  VDRA. D/c velphoro. Will restart another binder either while admitted or at d/c. 8. Nutrition - Alb 2.8, renal diet with fluid restrictions. Renavite. Add Nepro 9. VTE - chronic anticoagulation - per primary    Kelly Splinter MD [place] pager [phone]   04/22/2017, 11:21 AM

## 2017-04-22 NOTE — Progress Notes (Signed)
PROGRESS NOTE  Shianna Bally  WRU:[id number] DOB: May 13, 1965 DOA: 04/15/2017 PCP: System, Pcp Not In   Brief Narrative: Idalia Allbritton is a 52 y.o. 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 Patient underwent change out of her nephrostomy tube 3 days PTA and noted abdominal pain, nausea, vomiting, diarrhea and fevers. Urinalysis suggested UTI and CT demonstrated acute right pyelonephritis. Nephrology was consulted, supervising HD while inpatient. Urine culture has grown ESBL E. coli, treated with meropenem. Diarrhea has continued, so binders were changed by renal.   Assessment & Plan: Principal Problem:   Pyelonephritis Active Problems:   ESRD on dialysis [facility])   Chronic anticoagulation   History of adverse effect of venous thromboembolism (VTE) prophylaxis   Depression  Acute right pyelonephritis due to ESBL E. coli: Complicated by right nephrostomy tube (exchanged 4/10). - Continuing meropenem, discussed definitive abx therapy with ID, Dr. Baxter Flattery. Plan to continue meropenem x7 total days (will finish 4/20) and give single dose of fosfomycin the following day as mop up.  - Per IR, no repeat exchange of nephrostomy tube is indicated.   ESRD:  - Continue TTS HD through [facility] per nephrology. Next is Sat as inpatient.   Recurrent VTE: Including DVT despite eliquis.  - Started on heparin initially, and converted back to home eliquis 4/16 with no procedures planned.  - Has follow up with vascular surgery at [facility] 4/24 as she is current TDC-dependent  Anemia of chronic disease: Hgb stable at 8.6-8.9.  - Aranesp per renal, monitor CBC prn  bleeding. Hgb trending upward.  Depression: Mood is stable - Continue home medication  DVT prophylaxis: Eliquis Code Status: Full Family Communication: None at bedside Disposition Plan: Home after HD and final meropenem dose 4/20.  Consultants: Nephrology. ID by phone, Dr. Baxter Flattery 4/18. Procedures: Routine HD  Antimicrobials:  Vancomycin, zosyn 4/12  Meropenem 4/13 - 4/20  Fosfomycin on 4/21 (planned)  Subjective: Diarrhea continues and is associated with nausea, but has been eating. Denies fevers, chills, back pain, abdominal pain, vomiting, chest pain, dyspnea, palpitations.   Objective: BP 111/71 (BP Location: Right Arm)   Pulse 64   Temp 97.9 F (36.6 C) (Oral)   Resp 16   Ht 5\' 7"  (1.702 m)   Wt 112.2 kg (247 lb 5.7 oz)   SpO2 99%   BMI 38.74 kg/m   Gen: Pleasant, obese female in no distress resting quietly in bed. Pulm: Non-labored breathing room air. Clear to auscultation bilaterally.  CV: Regular rate and rhythm. No murmur, rub, or gallop. No JVD, Left 1+ nonpitting pedal edema. GI: Abdomen soft, non-tender, non-distended, with normoactive bowel sounds. No organomegaly or masses felt. Ext: Warm, no deformities. Upper extremity grafts without tenderness, bruit, or thrill. Right femoral graft palpated without thrill, bruit, or tenderness; appears without erythema Left forearm PIV intact.  Skin: Right posterior PCN site without significant erythema, discharge, tenderness, induration. Abdominal abrasion with granulation tissue, scant sanguinous discharge on bandage, stable. Right TDC site c/d/i with mild tenderness but no erythema, fluctuance or discharge.  Neuro: Alert and oriented. No focal neurological deficits. Psych: Judgement and insight appear normal. Mood & affect appropriate.   CBC: Recent Labs  Lab 04/18/17 0617 04/19/17  3825 04/19/17 0817 04/21/17 0730 04/22/17 0606  WBC 5.1 4.4 4.9 5.3 6.2  HGB 8.6* 8.6* 8.6* 9.6* 10.1*  HCT 27.3* 28.5* 28.4* 31.6*  33.3*  MCV 99.6 99.7 99.6 100.3* 99.7  PLT 263 263 313 308 053   Basic Metabolic Panel: Recent Labs  Lab 04/16/17 1425 04/17/17 0648 04/19/17 0817 04/21/17 0550 04/21/17 0730  NA 136 137 141 140 140  K 3.8 3.5 3.5 3.7 3.8  CL 101 101 107 106 105  CO2 22 22 22 23 24   GLUCOSE 84 99 92 98 94  BUN 38* 16 23* 22* 22*  CREATININE 8.29* 5.27* 9.22* 7.69* 7.80*  CALCIUM 8.6* 7.8* 8.1* 8.6* 8.6*  PHOS 7.5*  --  5.0* 4.4 4.6   Liver Function Tests: Recent Labs  Lab 04/16/17 0442 04/16/17 1425 04/19/17 0817 04/21/17 0550 04/21/17 0730  AST 17  --   --   --   --   ALT 12*  --   --   --   --   ALKPHOS 55  --   --   --   --   BILITOT 0.8  --   --   --   --   PROT 7.7  --   --   --   --   ALBUMIN 3.5 2.8* 2.6* 2.8* 2.8*   Recent Labs  Lab 04/15/17 2215  LIPASE 44   Urine analysis:    Component Value Date/Time   COLORURINE YELLOW 04/16/2017 0518   APPEARANCEUR CLOUDY (A) 04/16/2017 0518   LABSPEC 1.014 04/16/2017 0518   PHURINE 6.0 04/16/2017 0518   GLUCOSEU NEGATIVE 04/16/2017 0518   HGBUR MODERATE (A) 04/16/2017 0518   BILIRUBINUR NEGATIVE 04/16/2017 0518   KETONESUR NEGATIVE 04/16/2017 0518   PROTEINUR 100 (A) 04/16/2017 0518   NITRITE NEGATIVE 04/16/2017 0518   LEUKOCYTESUR LARGE (A) 04/16/2017 0518   Recent Results (from the past 240 hour(s))  Urine Culture     Status: Abnormal   Collection Time: 04/16/17  5:20 AM  Result Value Ref Range Status   Specimen Description URINE, CATHETERIZED  Final   Special Requests NONE  Final   Culture (A)  Final    >=100,000 COLONIES/mL ESCHERICHIA COLI Confirmed Extended Spectrum Beta-Lactamase Producer (ESBL).  In bloodstream infections from ESBL organisms, carbapenems are preferred over piperacillin/tazobactam. They are shown to have a lower risk of mortality. Performed at [facility], [place] [address], [place], [place] [address]    Report Status 04/18/2017 FINAL  Final   Organism ID, Bacteria ESCHERICHIA COLI (A)   Final      Susceptibility   Escherichia coli - MIC*    AMPICILLIN >=32 RESISTANT Resistant     CEFAZOLIN >=64 RESISTANT Resistant     CEFTRIAXONE >=64 RESISTANT Resistant     CIPROFLOXACIN >=4 RESISTANT Resistant     GENTAMICIN <=1 SENSITIVE Sensitive     IMIPENEM <=0.25 SENSITIVE Sensitive     NITROFURANTOIN <=16 SENSITIVE Sensitive     TRIMETH/SULFA >=320 RESISTANT Resistant     AMPICILLIN/SULBACTAM 16 INTERMEDIATE Intermediate     PIP/TAZO <=4 SENSITIVE Sensitive     Extended ESBL POSITIVE Resistant     * >=100,000 COLONIES/mL ESCHERICHIA COLI  Blood culture (routine x 2)     Status: None   Collection Time: 04/16/17  5:21 AM  Result Value Ref Range Status   Specimen Description BLOOD RIGHT FOREARM  Final   Special Requests   Final    BOTTLES DRAWN AEROBIC AND ANAEROBIC Blood Culture  adequate volume   Culture   Final    NO GROWTH 5 DAYS Performed at [facility], [place] [address] [place], [place] [address]    Report Status 04/21/2017 FINAL  Final  Blood culture (routine x 2)     Status: None   Collection Time: 04/16/17  5:31 AM  Result Value Ref Range Status   Specimen Description BLOOD RIGHT HAND  Final   Special Requests   Final    BOTTLES DRAWN AEROBIC AND ANAEROBIC Blood Culture adequate volume   Culture   Final    NO GROWTH 5 DAYS Performed at [facility], [place] [address], [place], [place] [address]    Report Status 04/21/2017 FINAL  Final  C difficile quick scan w PCR reflex     Status: None   Collection Time: 04/17/17  5:11 AM  Result Value Ref Range Status   C Diff antigen NEGATIVE NEGATIVE Final   C Diff toxin NEGATIVE NEGATIVE Final   C Diff interpretation No C. difficile detected.  Final    Comment: Performed at [facility], [place] [address], [place], [place] [address]  Gastrointestinal Panel by PCR , Stool     Status: None   Collection Time: 04/17/17  6:33 PM  Result Value Ref Range Status   Campylobacter species NOT DETECTED NOT DETECTED  Final   Plesimonas shigelloides NOT DETECTED NOT DETECTED Final   Salmonella species NOT DETECTED NOT DETECTED Final   Yersinia enterocolitica NOT DETECTED NOT DETECTED Final   Vibrio species NOT DETECTED NOT DETECTED Final   Vibrio cholerae NOT DETECTED NOT DETECTED Final   Enteroaggregative E coli (EAEC) NOT DETECTED NOT DETECTED Final   Enteropathogenic E coli (EPEC) NOT DETECTED NOT DETECTED Final   Enterotoxigenic E coli (ETEC) NOT DETECTED NOT DETECTED Final   Shiga like toxin producing E coli (STEC) NOT DETECTED NOT DETECTED Final   Shigella/Enteroinvasive E coli (EIEC) NOT DETECTED NOT DETECTED Final   Cryptosporidium NOT DETECTED NOT DETECTED Final   Cyclospora cayetanensis NOT DETECTED NOT DETECTED Final   Entamoeba histolytica NOT DETECTED NOT DETECTED Final   Giardia lamblia NOT DETECTED NOT DETECTED Final   Adenovirus F40/41 NOT DETECTED NOT DETECTED Final   Astrovirus NOT DETECTED NOT DETECTED Final   Norovirus GI/GII NOT DETECTED NOT DETECTED Final   Rotavirus A NOT DETECTED NOT DETECTED Final   Sapovirus (I, II, IV, and V) NOT DETECTED NOT DETECTED Final    Comment: Performed at [facility], [address], [place], [place] [address]      Radiology Studies: No results found.  Scheduled Meds: . apixaban  5 mg Oral BID  . calcitRIOL  0.75 mcg Oral Daily  . cinacalcet  90 mg Oral Q breakfast  . darbepoetin (ARANESP) injection - DIALYSIS  200 mcg Intravenous Q Thu-HD  . gabapentin  100 mg Oral QHS  . lanthanum  1,000 mg Oral TID WC  . multivitamin  1 tablet Oral Daily  . pantoprazole  40 mg Oral Q1200  . saccharomyces boulardii  250 mg Oral BID  . sertraline  50 mg Oral QHS   Continuous Infusions: . meropenem (MERREM) IV 500 mg (04/22/17 1301)     LOS: 6 days   Time spent: 25 minutes.  Vance Gather, MD [facility] Pager [phone]  If 7PM-7AM, please contact night-coverage [email or web address] Password [email or web address] 04/22/2017, 2:02 PM

## 2017-04-23 DIAGNOSIS — Z888 Allergy status to other drugs, medicaments and biological substances status: Secondary | ICD-10-CM

## 2017-04-23 LAB — RENAL FUNCTION PANEL
ALBUMIN: 3 g/dL — AB (ref 3.5–5.0)
Anion gap: 12 (ref 5–15)
BUN: 31 mg/dL — AB (ref 6–20)
CALCIUM: 9.2 mg/dL (ref 8.9–10.3)
CO2: 23 mmol/L (ref 22–32)
CREATININE: 7.55 mg/dL — AB (ref 0.44–1.00)
Chloride: 104 mmol/L (ref 101–111)
GFR, EST AFRICAN AMERICAN: 6 mL/min — AB (ref >60)
GFR, EST NON AFRICAN AMERICAN: 6 mL/min — AB (ref >60)
Glucose, Bld: 84 mg/dL (ref 65–99)
PHOSPHORUS: 5.8 mg/dL — AB (ref 2.5–4.6)
Potassium: 4.1 mmol/L (ref 3.5–5.1)
SODIUM: 139 mmol/L (ref 135–145)

## 2017-04-23 LAB — CBC
HCT: 33.6 % — ABNORMAL LOW (ref 36.0–46.0)
Hemoglobin: 10.2 g/dL — ABNORMAL LOW (ref 12.0–15.0)
MCH: 30.3 pg (ref 26.0–34.0)
MCHC: 30.4 g/dL (ref 30.0–36.0)
MCV: 99.7 fL (ref 78.0–100.0)
PLATELETS: 315 10*3/uL (ref 150–400)
RBC: 3.37 MIL/uL — AB (ref 3.87–5.11)
RDW: 15.6 % — AB (ref 11.5–15.5)
WBC: 6.8 10*3/uL (ref 4.0–10.5)

## 2017-04-23 MED ORDER — CALCITRIOL 0.25 MCG PO CAPS
ORAL_CAPSULE | ORAL | Status: AC
Start: 2017-04-23 — End: 2017-04-23
  Administered 2017-04-23: 0.25 ug
  Filled 2017-04-23: qty 1

## 2017-04-23 MED ORDER — LOPERAMIDE HCL 2 MG PO CAPS: 2.0000 mg | ORAL_CAPSULE | ORAL | 0 refills | Status: AC | PRN

## 2017-04-23 MED ORDER — FOSFOMYCIN TROMETHAMINE 3 G PO PACK
3.0000 g | PACK | Freq: Once | ORAL | Status: DC
  Filled 2017-04-23: qty 3

## 2017-04-23 MED ORDER — HEPARIN SODIUM (PORCINE) 1000 UNIT/ML IJ SOLN
5000.0000 [IU] | Freq: Once | INTRAMUSCULAR | Status: AC
  Administered 2017-04-23: 5000 [IU] via INTRAVENOUS

## 2017-04-23 MED ORDER — LANTHANUM CARBONATE 1000 MG PO CHEW: 1000.0000 mg | CHEWABLE_TABLET | Freq: Three times a day (TID) | ORAL | 0 refills | Status: AC

## 2017-04-23 MED ORDER — CALCITRIOL 0.5 MCG PO CAPS
ORAL_CAPSULE | ORAL | Status: AC
Start: 2017-04-23 — End: 2017-04-23
  Administered 2017-04-23: 0.5 ug
  Filled 2017-04-23: qty 1

## 2017-04-23 NOTE — Progress Notes (Signed)
[facility] Progress Note   Subjective:  feelling much better. Going home today  Objective Vitals:   04/23/17 1000 04/23/17 1030 04/23/17 1100 04/23/17 1120  BP: 110/61 (!) 148/65 140/68 (!) 112/58  Pulse: (!) 58 70 69 70  Resp:    16  Temp:    98.4 F (36.9 C)  TempSrc:    Oral  SpO2:    98%  Weight:    112.2 kg (247 lb 5.7 oz)  Height:       Physical Exam General:NAD, WDWN obese female laying in bed on HD Heart:RRR Lungs:CTAB Abdomen:soft, ND, minimal tenderness Extremities:trace edema Dialysis Access: TDC in use   Filed Weights   04/21/17 1114 04/23/17 0710 04/23/17 1120  Weight: 112.2 kg (247 lb 5.7 oz) 113.2 kg (249 lb 9 oz) 112.2 kg (247 lb 5.7 oz)    Intake/Output Summary (Last 24 hours) at 04/23/2017 1202 Last data filed at 04/23/2017 1120 Gross per 24 hour  Intake 220 ml  Output 1004 ml  Net -784 ml    Additional Objective Labs: Basic Metabolic Panel: Recent Labs  Lab 04/21/17 0550 04/21/17 0730 04/23/17 0755  NA 140 140 139  K 3.7 3.8 4.1  CL 106 105 104  CO2 23 24 23   GLUCOSE 98 94 84  BUN 22* 22* 31*  CREATININE 7.69* 7.80* 7.55*  CALCIUM 8.6* 8.6* 9.2  PHOS 4.4 4.6 5.8*   Liver Function Tests: Recent Labs  Lab 04/21/17 0550 04/21/17 0730 04/23/17 0755  ALBUMIN 2.8* 2.8* 3.0*   No results for input(s): LIPASE, AMYLASE in the last 168 hours. CBC: Recent Labs  Lab 04/19/17 0414 04/19/17 0817 04/21/17 0730 04/22/17 0606 04/23/17 0755  WBC 4.4 4.9 5.3 6.2 6.8  HGB 8.6* 8.6* 9.6* 10.1* 10.2*  HCT 28.5* 28.4* 31.6* 33.3* 33.6*  MCV 99.7 99.6 100.3* 99.7 99.7  PLT 263 313 308 275 315   Medications: . meropenem (MERREM) IV Stopped (04/22/17 1412)   . apixaban  5 mg Oral BID  . calcitRIOL  0.75 mcg Oral Daily  . cinacalcet  90 mg Oral Q breakfast  . darbepoetin (ARANESP) injection - DIALYSIS  200 mcg Intravenous Q Thu-HD  . fosfomycin  3 g Oral Once  . gabapentin  100 mg Oral QHS  . lanthanum  1,000 mg Oral TID  WC  . multivitamin  1 tablet Oral Daily  . pantoprazole  40 mg Oral Q1200  . saccharomyces boulardii  250 mg Oral BID  . sertraline  50 mg Oral QHS    Dialysis Orders: [facility] 4h 400/800 113kg 2/2 bath R IJ TDC (L thigh AVG clotted recently, not using anymore) Heparin 4000 bolus then 500 u/ hr prn -Venofer 50mg  IV qwk -mircera 129mcg IV q2wks - last 4/11 -Calcitriol 0.12mcg PO qHD  -Last Labs:4/11:Hgb10.2, P 7.8, Ca 8.4,3/28:TSAT31%, K4.3, EXB284, Albumin4.2  Assessment/Plan: 1. Right sided pyelo/ chronic R PCN: +CT changes, +UCx +resistant EColi. For DC today.  2. Diarrhea/ nausea/ vomiting- C diff negative, persistent issue, on probiotics.  3. ESRD - HD TTS. HD today. Going home after HD.  4. Recurrent HD access failures: had early clotting of AVG's in bilat UE's and more recently clotting of R thigh AVG placed 03/21/17 despite Eliquis Rx. Per [facility], not L thigh AVG candidate due to L thigh DVT. Seen by [facility] on 4/3, isnow TDC dependent.  5. Hypotension/volume -chronic hypotension. Close to dry wt, stable 6. Anemia of chronic disease-Hgb ^9.6, started aranesp 226mcg on 4/18 7. MBD ckd  -  CCa 9.6, P4.4, continue  VDRA. D/c velphoro. Will restart another binder either while admitted or at d/c. 8. Nutrition - Alb 2.8, renal diet with fluid restrictions. Renavite. Add Nepro 9. VTE - chronic anticoagulation - per primary    Kelly Splinter MD [facility] pager [phone]   04/23/2017, 12:02 PM

## 2017-04-23 NOTE — Discharge Summary (Signed)
Physician Discharge Summary  Judy Kelley ENI:[id number] DOB: 1965-12-28 DOA: 04/15/2017  PCP: System, Pcp Not In  Admit date: 04/15/2017 Discharge date: 04/23/2017  Admitted From: Home Disposition: Home   Recommendations for Outpatient Follow-up:  1. Follow up with PCP in 1-2 weeks 2. Continue routine lab monitoring and HD 3. Follow up with [facility] on 4/24  Home Health: None Equipment/Devices: None new Discharge Condition: Stable CODE STATUS: Full Diet recommendation: Renal  Brief/Interim Summary: Judy Kelley is a 52 y.o. 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 Patient underwent change out of her nephrostomy tube 3 days PTA and noted abdominal pain, nausea, vomiting, diarrhea and fevers. Urinalysis suggested UTI and CT demonstrated acute right pyelonephritis. Nephrology was consulted, supervising HD while inpatient. Urine culture has grown ESBL E. coli, treated with meropenem. Diarrhea has continued, so binders were changed by renal. Imodium was added with improvement after infectious work up was negative. After discussion with infectious diseases consultant, plan was to continue meropenem x7 days and mop up therapy x1 dose with fosfomycin, provided just prior to discharge.   Discharge Diagnoses:  Principal Problem:   Pyelonephritis Active Problems:   ESRD on dialysis [facility])   Chronic anticoagulation   History of adverse effect of venous thromboembolism (VTE) prophylaxis   Depression  Acute right pyelonephritis due to ESBL E. coli: Complicated by right nephrostomy tube (exchanged 4/10). - Continued meropenem, discussed  definitive abx therapy with ID, Dr. Baxter Flattery. Plan to continue meropenem x7 total days (finished 4/20) and give single dose of fosfomycin as mop up.  - Per IR, no repeat exchange of nephrostomy tube is indicated. Follow up as previously scheduled.  Diarrhea: N/V have subsided, and diarrhea improved. CDiff, GI pathogen panel negative. No colitis on CT.  - Continued florastor for antibiotic-associated component - Changed binder per nephrology - Imodium has improved symptoms.  Anemia of chronic disease: Hgb stable at 8.6-8.9.  - Aranesp per renal, monitor CBC prn bleeding and intermittently.   ESRD:  - Continue TTS HD through [facility] per nephrology. Next is Tuesday as outpatient.  Recurrent VTE: Including DVT despite eliquis.  - Started on heparin initially, and converted back to home eliquis 4/16 with no procedures planned.  - Has follow up with vascular surgery at [facility] 4/24 as she is current TDC-dependent  Depression: Mood is stable - Continue home medication  Discharge Instructions Discharge Instructions    Diet - low sodium heart healthy   Complete by:  As directed    Discharge instructions   Complete by:  As directed    You have taken the final dose of antibiotics required to complete treatment for the pyelonephritis due to ESBL E. coli. Nausea, vomiting, and diarrhea seem to have been improving, and you are taking adequate per oral intake, so you are stable for discharge with the following recommendations:  - Continue taking medications as you were previously with the exceptions as below:  - STOP taking velphoro and instead take lanthanum as directed - Take imodium as needed for diarrhea.  - If your symptoms return, seek medical attention right away.   Increase activity slowly   Complete by:  As directed  Allergies as of 04/23/2017      Reactions   Ciprofloxacin Other (See Comments)   IV only-burning to her veins, can take PO   Erythromycin Anaphylaxis   Chlorhexidine  Rash   Other Rash, Other (See Comments)   chloraprep      Medication List    STOP taking these medications   VELPHORO 500 MG chewable tablet Generic drug:  sucroferric oxyhydroxide     TAKE these medications   acetaminophen 500 MG tablet Commonly known as:  TYLENOL Take 1,000 mg by mouth every 6 (six) hours as needed for mild pain or headache.   cinacalcet 90 MG tablet Commonly known as:  SENSIPAR Take 90 mg by mouth daily.   ELIQUIS 5 MG Tabs tablet Generic drug:  apixaban Take 5 mg by mouth 2 (two) times daily.   gabapentin 100 MG capsule Commonly known as:  NEURONTIN Take 100 mg by mouth at bedtime.   lanthanum 1000 MG chewable tablet Commonly known as:  FOSRENOL Chew 1 tablet (1,000 mg total) by mouth 3 (three) times daily with meals.   loperamide 2 MG capsule Commonly known as:  IMODIUM Take 1 capsule (2 mg total) by mouth as needed for diarrhea or loose stools.   multivitamin Tabs tablet Take 1 tablet by mouth daily.   mupirocin ointment 2 % Commonly known as:  BACTROBAN Apply 1 application topically 2 (two) times daily as needed (rash).   promethazine 25 MG tablet Commonly known as:  PHENERGAN Take 25 mg by mouth every 6 (six) hours as needed for nausea or vomiting.   sertraline 50 MG tablet Commonly known as:  ZOLOFT Take 50 mg by mouth at bedtime.      Follow-up Information    Rexene Agent, MD Follow up.   Specialty:  Nephrology Contact information: [facility] [phone]          Allergies  Allergen Reactions  . Ciprofloxacin Other (See Comments)    IV only-burning to her veins, can take PO  . Erythromycin Anaphylaxis  . Chlorhexidine Rash  . Other Rash and Other (See Comments)    chloraprep    Consultations:  Nephrology  IR  Procedures/Studies: Dg Chest 2 View  Result Date: 04/17/2017 CLINICAL DATA:  Shortness of breath. EXAM: CHEST - 2 VIEW COMPARISON:  Chest x-ray dated April 15, 2017. FINDINGS:  Unchanged tunneled right internal jugular dialysis catheter with the tip in the right atrium. The heart size and mediastinal contours are within normal limits. Normal pulmonary vascularity. No focal consolidation, pleural effusion, or pneumothorax. Unchanged right axillary vascular stents. No acute osseous abnormality. IMPRESSION: No active cardiopulmonary disease. Electronically Signed   By: Titus Dubin M.D.   On: 04/17/2017 17:21   Dg Chest 2 View  Result Date: 04/15/2017 CLINICAL DATA:  Chest pain.  Shortness of breath today. EXAM: CHEST - 2 VIEW COMPARISON:  None. FINDINGS: Right internal jugular dialysis catheter with tip at the atrial caval junction. Vascular stents in the right axilla.The cardiomediastinal contours are normal. The lungs are clear. Pulmonary vasculature is normal. No consolidation, pleural effusion, or pneumothorax. No acute osseous abnormalities are seen. IMPRESSION: 1. No acute findings. 2. Right dialysis catheter with tip at the atrial caval junction. Electronically Signed   By: Jeb Levering M.D.   On: 04/15/2017 22:43   Ct Abdomen Pelvis W Contrast  Result Date: 04/16/2017 CLINICAL DATA:  53 year old female with epigastric pain nausea and vomiting beginning yesterday morning. Chronic dialysis patient. EXAM: CT ABDOMEN AND  PELVIS WITH CONTRAST TECHNIQUE: Multidetector CT imaging of the abdomen and pelvis was performed using the standard protocol following bolus administration of intravenous contrast. CONTRAST:  175mL ISOVUE-300 IOPAMIDOL (ISOVUE-300) INJECTION 61% COMPARISON:  CT Abdomen and Pelvis 12/05/2016 FINDINGS: Lower chest: Stable cardiomegaly. Catheter tips project in the right atrium and ventricle. No pericardial effusion. Stable lung bases, mild atelectasis greater on the left. No pleural effusion. Hepatobiliary: Negative liver and gallbladder. Pancreas: Negative. Spleen: Negative. Adrenals/Urinary Tract: Adrenal glands remain normal. Complete chronic atrophy of  the left renal parenchyma with residual round, dilated left renal collecting system and ureter. Chronic right nephrostomy tube remains in place and appears stable in configuration. However, there is new right perinephric inflammatory stranding and heterogeneously decreased enhancement and contrast excretion from the right kidney on the early and delayed postcontrast images. There is trace gas within the right renal collecting system. There is no right hydronephrosis. The right ureter is decompressed. Both ureters are thought to course to an ileal conduit which is thought to be visible exiting the ventral abdominal wall on sagittal image 51. The right ureter can be traced to this structure. The distal left ureter is obscured by bowel in the ventral abdomen. No definite urinary bladder is identified. Stomach/Bowel: Negative distal colon. The descending colon is decompressed. There is a chronic large up to 17 centimeter diameter ventral abdominal wall hernia with the 4-5 centimeter hernia neck containing transverse colon and mesentery. This appears stable with no acute inflammation. Along the left superior margin of this hernia is a smaller chronic fat/mesentery only containing hernia which is stable on series 3, image 37. The right colon contains a small volume of fluid. The cecum is located in the midline or slightly to the left of midline in the lower abdomen and anterior pelvis. Negative terminal ileum. The appendix is diminutive or absent. No pericecal inflammation. No dilated small bowel. There is a small bowel to small bowel anastomosis in the right abdomen on series 3, image 64 which is stable and without adverse features. Negative stomach and duodenum. No abdominal free air or free fluid. Vascular/Lymphatic: Aortoiliac calcified atherosclerosis. Suboptimal intravascular contrast timing but the major arterial structures in the abdomen and pelvis appear patent. The portal venous system is patent. There is a  right femoral region dialysis graft in place which is new since 2018. There is an adjacent round 2.8 centimeter simple fluid density probable postoperative seroma (series 3, image 91). There are chronic venous collaterals along the left Abdo and in the suprapubic subcutaneous fat. No lymphadenopathy. Reproductive: Negative. Other: Confluent presacral stranding is stable since December. No pelvic free fluid. Musculoskeletal: No acute osseous abnormality identified. IMPRESSION: 1. Chronic right nephrostomy tube with new right kidney heterogeneous enhancement and perinephric inflammation compared to December 2018. Consider Acute Pyelonephritis. Lack of contrast excretion on the right kidney now suggests progressed right renal insufficiency. 2. Chronic complete left renal atrophy. 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 3. A right femoral vessel dialysis graft has been placed since 2018. A 2.8 centimeter postoperative seroma is adjacent. 4. Chronic pelvic fibrosis, superficial pelvic venous collaterals, Aortic Atherosclerosis (ICD10-I70.0), large ventral abdominal wall hernia containing the transverse colon, and other abdominal and pelvic findings are stable since December. Electronically Signed   By: Genevie Ann M.D.   On: 04/16/2017 07:44   Ir Nephrostomy Exchange Right  Result Date: 04/13/2017 INDICATION: History of bladder carcinoma with chronic indwelling right percutaneous nephrostomy tube. EXAM: EXCHANGE OF  RIGHT PERCUTANEOUS NEPHROSTOMY TUBE COMPARISON:  None. MEDICATIONS: None ANESTHESIA/SEDATION: None CONTRAST:  10 mL Isovue-300-administered into the collecting system(s) FLUOROSCOPY TIME:  Fluoroscopy Time: 42 seconds.  17 mGy. COMPLICATIONS: None immediate. PROCEDURE: Informed written consent was obtained from the patient after a thorough discussion of the procedural risks, benefits and alternatives. All questions were addressed. Maximal Sterile Barrier Technique  was utilized including caps, mask, sterile gowns, sterile gloves, sterile drape, hand hygiene and skin antiseptic. A timeout was performed prior to the initiation of the procedure. A pre-existing 14 French nephrostomy tube was injected with contrast material under fluoroscopy. The catheter was cut and removed over a guidewire. A new 37 French catheter was advanced over a guidewire and formed. The catheter was injected with contrast material and a fluoroscopic spot image obtained to confirm catheter position. The catheter was connected to a gravity drainage bag. IMPRESSION: Exchange of indwelling right percutaneous nephrostomy tube. Electronically Signed   By: Aletta Edouard M.D.   On: 04/13/2017 14:48   Subjective: Nausea and vomiting resolved. Ate breakfast this morning. Tolerating HD today. No fevers, chills, back pain, abd pain, bleeding/bruising. Diarrhea improved with imodium prn (no episodes on day of discharge). Wants to go home.  Discharge Exam: Vitals:   04/23/17 1100 04/23/17 1120  BP: 140/68 (!) 112/58  Pulse: 69 70  Resp:  16  Temp:  98.4 F (36.9 C)  SpO2:     Gen: Pleasant, obese female in no distress Pulm: Non-labored breathing room air. Clear to auscultation bilaterally.  CV: Regular rate and rhythm. No murmur, rub, or gallop. No JVD, Left 1+ nonpitting pedal edema. GI: Abdomen soft, non-tender, non-distended, with normoactive bowel sounds. No organomegaly or masses felt. Ext: Warm, no deformities. Upper extremity grafts without tenderness, bruit, or thrill. Right femoral graft palpated without thrill, bruit, or tenderness.  Skin: Right posterior PCN site without significant erythema, discharge, tenderness, induration. Abdominal abrasion with granulation tissue, scant sanguinous discharge on bandage and in wound bed without active bleeding or discharge. Right TDC site c/d/i with mild tenderness but no erythema or discharge.  Neuro: Alert and oriented. No focal neurological  deficits. Psych: Judgement and insight appear normal. Mood & affect appropriate.   Labs: BNP (last 3 results) No results for input(s): BNP in the last 8760 hours. Basic Metabolic Panel: Recent Labs  Lab 04/16/17 1425 04/17/17 0648 04/19/17 0817 04/21/17 0550 04/21/17 0730 04/23/17 0755  NA 136 137 141 140 140 139  K 3.8 3.5 3.5 3.7 3.8 4.1  CL 101 101 107 106 105 104  CO2 22 22 22 23 24 23   GLUCOSE 84 99 92 98 94 84  BUN 38* 16 23* 22* 22* 31*  CREATININE 8.29* 5.27* 9.22* 7.69* 7.80* 7.55*  CALCIUM 8.6* 7.8* 8.1* 8.6* 8.6* 9.2  PHOS 7.5*  --  5.0* 4.4 4.6 5.8*   Liver Function Tests: Recent Labs  Lab 04/16/17 1425 04/19/17 0817 04/21/17 0550 04/21/17 0730 04/23/17 0755  ALBUMIN 2.8* 2.6* 2.8* 2.8* 3.0*   No results for input(s): LIPASE, AMYLASE in the last 168 hours. No results for input(s): AMMONIA in the last 168 hours. CBC: Recent Labs  Lab 04/19/17 0414 04/19/17 0817 04/21/17 0730 04/22/17 0606 04/23/17 0755  WBC 4.4 4.9 5.3 6.2 6.8  HGB 8.6* 8.6* 9.6* 10.1* 10.2*  HCT 28.5* 28.4* 31.6* 33.3* 33.6*  MCV 99.7 99.6 100.3* 99.7 99.7  PLT 263 313 308 275 315   Cardiac Enzymes: No results for input(s): CKTOTAL, CKMB, CKMBINDEX, TROPONINI in the last  168 hours. BNP: Invalid input(s): POCBNP CBG: No results for input(s): GLUCAP in the last 168 hours. D-Dimer No results for input(s): DDIMER in the last 72 hours. Hgb A1c No results for input(s): HGBA1C in the last 72 hours. Lipid Profile No results for input(s): CHOL, HDL, LDLCALC, TRIG, CHOLHDL, LDLDIRECT in the last 72 hours. Thyroid function studies No results for input(s): TSH, T4TOTAL, T3FREE, THYROIDAB in the last 72 hours.  Invalid input(s): FREET3 Anemia work up No results for input(s): VITAMINB12, FOLATE, FERRITIN, TIBC, IRON, RETICCTPCT in the last 72 hours. Urinalysis    Component Value Date/Time   COLORURINE YELLOW 04/16/2017 0518   APPEARANCEUR CLOUDY (A) 04/16/2017 0518   LABSPEC  1.014 04/16/2017 0518   PHURINE 6.0 04/16/2017 0518   GLUCOSEU NEGATIVE 04/16/2017 0518   HGBUR MODERATE (A) 04/16/2017 0518   BILIRUBINUR NEGATIVE 04/16/2017 0518   KETONESUR NEGATIVE 04/16/2017 0518   PROTEINUR 100 (A) 04/16/2017 0518   NITRITE NEGATIVE 04/16/2017 0518   LEUKOCYTESUR LARGE (A) 04/16/2017 0518    Microbiology Recent Results (from the past 240 hour(s))  Urine Culture     Status: Abnormal   Collection Time: 04/16/17  5:20 AM  Result Value Ref Range Status   Specimen Description URINE, CATHETERIZED  Final   Special Requests NONE  Final   Culture (A)  Final    >=100,000 COLONIES/mL ESCHERICHIA COLI Confirmed Extended Spectrum Beta-Lactamase Producer (ESBL).  In bloodstream infections from ESBL organisms, carbapenems are preferred over piperacillin/tazobactam. They are shown to have a lower risk of mortality. Performed at [facility], [place] [address], [place], [place] [address]    Report Status 04/18/2017 FINAL  Final   Organism ID, Bacteria ESCHERICHIA COLI (A)  Final      Susceptibility   Escherichia coli - MIC*    AMPICILLIN >=32 RESISTANT Resistant     CEFAZOLIN >=64 RESISTANT Resistant     CEFTRIAXONE >=64 RESISTANT Resistant     CIPROFLOXACIN >=4 RESISTANT Resistant     GENTAMICIN <=1 SENSITIVE Sensitive     IMIPENEM <=0.25 SENSITIVE Sensitive     NITROFURANTOIN <=16 SENSITIVE Sensitive     TRIMETH/SULFA >=320 RESISTANT Resistant     AMPICILLIN/SULBACTAM 16 INTERMEDIATE Intermediate     PIP/TAZO <=4 SENSITIVE Sensitive     Extended ESBL POSITIVE Resistant     * >=100,000 COLONIES/mL ESCHERICHIA COLI  Blood culture (routine x 2)     Status: None   Collection Time: 04/16/17  5:21 AM  Result Value Ref Range Status   Specimen Description BLOOD RIGHT FOREARM  Final   Special Requests   Final    BOTTLES DRAWN AEROBIC AND ANAEROBIC Blood Culture adequate volume   Culture   Final    NO GROWTH 5 DAYS Performed at [facility], [address]  [place], [place] [address]    Report Status 04/21/2017 FINAL  Final  Blood culture (routine x 2)     Status: None   Collection Time: 04/16/17  5:31 AM  Result Value Ref Range Status   Specimen Description BLOOD RIGHT HAND  Final   Special Requests   Final    BOTTLES DRAWN AEROBIC AND ANAEROBIC Blood Culture adequate volume   Culture   Final    NO GROWTH 5 DAYS Performed at [facility], [place] [address] [place], [place] [address]    Report Status 04/21/2017 FINAL  Final  C difficile quick scan w PCR reflex     Status: None   Collection Time:  04/17/17  5:11 AM  Result Value Ref Range Status   C Diff antigen NEGATIVE NEGATIVE Final   C Diff toxin NEGATIVE NEGATIVE Final   C Diff interpretation No C. difficile detected.  Final    Comment: Performed at [facility], [place] [address] [place], [place] [address]  Gastrointestinal Panel by PCR , Stool     Status: None   Collection Time: 04/17/17  6:33 PM  Result Value Ref Range Status   Campylobacter species NOT DETECTED NOT DETECTED Final   Plesimonas shigelloides NOT DETECTED NOT DETECTED Final   Salmonella species NOT DETECTED NOT DETECTED Final   Yersinia enterocolitica NOT DETECTED NOT DETECTED Final   Vibrio species NOT DETECTED NOT DETECTED Final   Vibrio cholerae NOT DETECTED NOT DETECTED Final   Enteroaggregative E coli (EAEC) NOT DETECTED NOT DETECTED Final   Enteropathogenic E coli (EPEC) NOT DETECTED NOT DETECTED Final   Enterotoxigenic E coli (ETEC) NOT DETECTED NOT DETECTED Final   Shiga like toxin producing E coli (STEC) NOT DETECTED NOT DETECTED Final   Shigella/Enteroinvasive E coli (EIEC) NOT DETECTED NOT DETECTED Final   Cryptosporidium NOT DETECTED NOT DETECTED Final   Cyclospora cayetanensis NOT DETECTED NOT DETECTED Final   Entamoeba histolytica NOT DETECTED NOT DETECTED Final   Giardia lamblia NOT DETECTED NOT DETECTED Final   Adenovirus F40/41 NOT DETECTED NOT DETECTED Final   Astrovirus NOT DETECTED NOT  DETECTED Final   Norovirus GI/GII NOT DETECTED NOT DETECTED Final   Rotavirus A NOT DETECTED NOT DETECTED Final   Sapovirus (I, II, IV, and V) NOT DETECTED NOT DETECTED Final    Comment: Performed at [facility], [address] [place], [place] [address]    Time coordinating discharge: Approximately 40 minutes  Vance Gather, MD  [facility] 04/23/2017, 11:41 AM Pager [phone]

## 2017-05-02 DIAGNOSIS — C801 Malignant (primary) neoplasm, unspecified: Secondary | ICD-10-CM | POA: Insufficient documentation

## 2017-05-02 DIAGNOSIS — F419 Anxiety disorder, unspecified: Secondary | ICD-10-CM | POA: Insufficient documentation

## 2017-05-03 DIAGNOSIS — Z992 Dependence on renal dialysis: Secondary | ICD-10-CM | POA: Insufficient documentation

## 2017-05-03 DIAGNOSIS — T829XXA Unspecified complication of cardiac and vascular prosthetic device, implant and graft, initial encounter: Secondary | ICD-10-CM | POA: Insufficient documentation

## 2017-05-03 DIAGNOSIS — Z86718 Personal history of other venous thrombosis and embolism: Secondary | ICD-10-CM | POA: Insufficient documentation

## 2017-05-03 DIAGNOSIS — N186 End stage renal disease: Secondary | ICD-10-CM | POA: Insufficient documentation

## 2017-05-03 DIAGNOSIS — N139 Obstructive and reflux uropathy, unspecified: Secondary | ICD-10-CM | POA: Insufficient documentation

## 2017-06-06 ENCOUNTER — Other Ambulatory Visit (HOSPITAL_COMMUNITY): Payer: Self-pay | Admitting: Nephrology

## 2017-06-06 ENCOUNTER — Other Ambulatory Visit: Payer: Self-pay | Admitting: Radiology

## 2017-06-06 DIAGNOSIS — N186 End stage renal disease: Secondary | ICD-10-CM

## 2017-06-06 DIAGNOSIS — Z992 Dependence on renal dialysis: Principal | ICD-10-CM

## 2017-06-07 ENCOUNTER — Encounter (HOSPITAL_COMMUNITY): Payer: Self-pay

## 2017-06-07 ENCOUNTER — Other Ambulatory Visit (HOSPITAL_COMMUNITY): Payer: Self-pay | Admitting: Nephrology

## 2017-06-07 ENCOUNTER — Ambulatory Visit (HOSPITAL_COMMUNITY)
Admission: RE | Admit: 2017-06-07 | Discharge: 2017-06-07 | Disposition: A | Discharge status: Home / Self Care | Payer: Medicare Other | Source: Ambulatory Visit | Attending: Nephrology | Admitting: Nephrology

## 2017-06-07 DIAGNOSIS — Z9851 Tubal ligation status: Secondary | ICD-10-CM | POA: Diagnosis not present

## 2017-06-07 DIAGNOSIS — Z881 Allergy status to other antibiotic agents status: Secondary | ICD-10-CM | POA: Insufficient documentation

## 2017-06-07 DIAGNOSIS — Z8041 Family history of malignant neoplasm of ovary: Secondary | ICD-10-CM | POA: Diagnosis not present

## 2017-06-07 DIAGNOSIS — Z888 Allergy status to other drugs, medicaments and biological substances status: Secondary | ICD-10-CM | POA: Diagnosis not present

## 2017-06-07 DIAGNOSIS — Z992 Dependence on renal dialysis: Principal | ICD-10-CM

## 2017-06-07 DIAGNOSIS — Z8249 Family history of ischemic heart disease and other diseases of the circulatory system: Secondary | ICD-10-CM | POA: Diagnosis not present

## 2017-06-07 DIAGNOSIS — Z841 Family history of disorders of kidney and ureter: Secondary | ICD-10-CM | POA: Diagnosis not present

## 2017-06-07 DIAGNOSIS — I120 Hypertensive chronic kidney disease with stage 5 chronic kidney disease or end stage renal disease: Secondary | ICD-10-CM | POA: Insufficient documentation

## 2017-06-07 DIAGNOSIS — Z86718 Personal history of other venous thrombosis and embolism: Secondary | ICD-10-CM | POA: Diagnosis not present

## 2017-06-07 DIAGNOSIS — Z8541 Personal history of malignant neoplasm of cervix uteri: Secondary | ICD-10-CM | POA: Insufficient documentation

## 2017-06-07 DIAGNOSIS — N186 End stage renal disease: Secondary | ICD-10-CM

## 2017-06-07 DIAGNOSIS — Z7901 Long term (current) use of anticoagulants: Secondary | ICD-10-CM | POA: Diagnosis not present

## 2017-06-07 DIAGNOSIS — Z87442 Personal history of urinary calculi: Secondary | ICD-10-CM | POA: Diagnosis not present

## 2017-06-07 DIAGNOSIS — Z79899 Other long term (current) drug therapy: Secondary | ICD-10-CM | POA: Diagnosis not present

## 2017-06-07 HISTORY — PX: IR FLUORO GUIDE CV LINE RIGHT: IMG2283

## 2017-06-07 HISTORY — PX: IR VENOCAVAGRAM IVC: IMG678

## 2017-06-07 HISTORY — PX: IR PTA VENOUS EXCEPT DIALYSIS CIRCUIT: IMG6126

## 2017-06-07 MED ORDER — HEPARIN SODIUM (PORCINE) 1000 UNIT/ML IJ SOLN
INTRAMUSCULAR | Status: AC
  Administered 2017-06-07: 3.2 mL
  Filled 2017-06-07: qty 1

## 2017-06-07 MED ORDER — CHLORHEXIDINE GLUCONATE 4 % EX LIQD
CUTANEOUS | Status: AC
  Filled 2017-06-07: qty 15

## 2017-06-07 MED ORDER — LIDOCAINE HCL (PF) 1 % IJ SOLN
INTRAMUSCULAR | Status: DC | PRN
  Administered 2017-06-07: 12 mL

## 2017-06-07 MED ORDER — GELATIN ABSORBABLE 12-7 MM EX MISC
CUTANEOUS | Status: AC
  Filled 2017-06-07: qty 1

## 2017-06-07 MED ORDER — IOPAMIDOL (ISOVUE-300) INJECTION 61%
INTRAVENOUS | Status: AC
  Filled 2017-06-07: qty 50

## 2017-06-07 MED ORDER — CEFAZOLIN SODIUM-DEXTROSE 2-4 GM/100ML-% IV SOLN
2.0000 g | Freq: Once | INTRAVENOUS | Status: AC
  Administered 2017-06-07: 2 g via INTRAVENOUS

## 2017-06-07 MED ORDER — SODIUM CHLORIDE 0.9 % IV SOLN: INTRAVENOUS | Status: DC

## 2017-06-07 MED ORDER — CEFAZOLIN SODIUM-DEXTROSE 2-4 GM/100ML-% IV SOLN
INTRAVENOUS | Status: AC
  Filled 2017-06-07: qty 100

## 2017-06-07 MED ORDER — LIDOCAINE HCL 1 % IJ SOLN
INTRAMUSCULAR | Status: AC
  Filled 2017-06-07: qty 20

## 2017-06-07 MED ORDER — IOPAMIDOL (ISOVUE-300) INJECTION 61%
INTRAVENOUS | Status: AC
  Administered 2017-06-07: 40 mL
  Filled 2017-06-07: qty 50

## 2017-06-07 NOTE — H&P (Signed)
Chief Complaint: Patient was seen in consultation today for hemodialysis catheter exchange at the request of Ciales B  Referring Physician(s): Sanford,Ryan B  Supervising Physician: Daryll Brod  Patient Status: [facility] - Out-pt  History of Present Illness: Judy Kelley is a 52 y.o. female   Rt IJ tunneled cath in place - Dr Augustin Coupe over 1 week ago Dialysis Thurs was complete Sat Dialysis only half an hr-- cath flow - no help Previous to this one was another Rt IJ-- lasted only 1 day-- malfunctioned  Requesting exchange today New placement if needed Pt is taking Eliquis daily   She is scheduled to see Dr Maryjean Morn at [facility] June 20th --- evaluate for new sites for dialysis access   Past Medical History:  Diagnosis Date  . Anemia   . Anxiety   . Cervical cancer (HCC)    Cervical and ovarian  . DVT (deep venous thrombosis) ([place])   . History of kidney stones   . Hypertension    treated years ago, on no medications for at least years ( as of 2019)  . Renal disorder    ESRD - stage 5 Dialysis T/Th/Sa  . Renal insufficiency     Past Surgical History:  Procedure Laterality Date  . AV FISTULA PLACEMENT Right 03/21/2017   Procedure: INSERTION OF ARTERIOVENOUS (AV) GORE-TEX GRAFT RIGHT THIGH;  Surgeon: Angelia Mould, MD;  Location: [place];  Service: Vascular;  Laterality: Right;  . CYSTOSCOPY N/A 12/09/2016   Procedure: ENDOSCOPY OF ILEAL CONDUIT;  Surgeon: Irine Seal, MD;  Location: [place];  Service: Urology;  Laterality: N/A;  . GRAFT APPLICATION Right 16/10/9602   INSERTION OF ARTERIOVENOUS (AV) GORE-TEX GRAFT RIGHT THIGH   . ileal conduit    . IR FLUORO GUIDE CV LINE RIGHT  12/09/2016  . IR NEPHROSTOMY EXCHANGE RIGHT  01/21/2017  . IR NEPHROSTOMY EXCHANGE RIGHT  04/13/2017  . IR THROMBECTOMY AV FISTULA W/THROMBOLYSIS/PTA/STENT INC/SHUNT/IMG RT Right 12/09/2016  . IR US GUIDE VASC ACCESS RIGHT  12/09/2016  . IR US GUIDE VASC ACCESS RIGHT  12/09/2016  .  nephrosotomy    . TONSILLECTOMY    . TUBAL LIGATION      Allergies: Ciprofloxacin; Erythromycin; Chlorhexidine; and Other  Medications: Prior to Admission medications   Medication Sig Start Date End Date Taking? Authorizing Provider  apixaban (ELIQUIS) 5 MG TABS tablet Take 5 mg by mouth 2 (two) times daily.   Yes [provider]  cinacalcet (SENSIPAR) 90 MG tablet Take 90 mg by mouth daily.   Yes [provider]  gabapentin (NEURONTIN) 100 MG capsule Take 100 mg by mouth at bedtime.   Yes [provider]  lanthanum (FOSRENOL) 1000 MG chewable tablet Chew 1 tablet (1,000 mg total) by mouth 3 (three) times daily with meals. 04/23/17  Yes Patrecia Pour, MD  multivitamin (RENA-VIT) TABS tablet Take 1 tablet by mouth daily.   Yes [provider]  mupirocin ointment (BACTROBAN) 2 % Apply 1 application topically 2 (two) times daily as needed (rash).    Yes [provider]  promethazine (PHENERGAN) 25 MG tablet Take 25 mg by mouth every 6 (six) hours as needed for nausea or vomiting.   Yes [provider]  sertraline (ZOLOFT) 50 MG tablet Take 50 mg by mouth at bedtime.    Yes [provider]  acetaminophen (TYLENOL) 500 MG tablet Take 1,000 mg by mouth every 6 (six) hours as needed for mild pain or headache.  [provider]  loperamide (IMODIUM) 2 MG capsule Take 1 capsule (2 mg total) by mouth as needed for diarrhea or loose stools. Patient not taking: Reported on 06/07/2017 04/23/17   Patrecia Pour, MD     Family History  Problem Relation Age of Onset  . Diabetes Mellitus II Mother   . Kidney disease Mother   . Heart disease Mother   . Rheum arthritis Mother   . COPD Mother   . Diabetes Mellitus II Father   . Heart disease Father   . Dementia Father   . Ovarian cancer Maternal Aunt     Social History   Socioeconomic History  . Marital status: Single    Spouse name: Not on file  . Number of children: Not on  file  . Years of education: Not on file  . Highest education level: Not on file  Occupational History  . Not on file  Social Needs  . Financial resource strain: Not on file  . Food insecurity:    Worry: Not on file    Inability: Not on file  . Transportation needs:    Medical: Not on file    Non-medical: Not on file  Tobacco Use  . Smoking status: Never Smoker  . Smokeless tobacco: Never Used  Substance and Sexual Activity  . Alcohol use: Yes    Frequency: Never    Comment: occasional wine  . Drug use: No  . Sexual activity: Not on file  Lifestyle  . Physical activity:    Days per week: Not on file    Minutes per session: Not on file  . Stress: Not on file  Relationships  . Social connections:    Talks on phone: Not on file    Gets together: Not on file    Attends religious service: Not on file    Active member of club or organization: Not on file    Attends meetings of clubs or organizations: Not on file    Relationship status: Not on file  Other Topics Concern  . Not on file  Social History Narrative  . Not on file    Review of Systems: A 12 point ROS discussed and pertinent positives are indicated in the HPI above.  All other systems are negative.  Review of Systems  Constitutional: Negative for activity change, fatigue and fever.  Respiratory: Negative for shortness of breath.   Cardiovascular: Negative for chest pain.  Gastrointestinal: Negative for abdominal pain.  Neurological: Negative for weakness.  Psychiatric/Behavioral: Negative for behavioral problems and confusion.    Vital Signs: There were no vitals taken for this visit.  Physical Exam  Constitutional: She is oriented to person, place, and time.  Cardiovascular: Normal rate, regular rhythm and normal heart sounds.  Pulmonary/Chest: Effort normal and breath sounds normal.  Abdominal: Soft. Bowel sounds are normal.  Neurological: She is alert and oriented to person, place, and time.  Skin:  Skin is warm and dry.  Psychiatric: She has a normal mood and affect. Her behavior is normal. Judgment and thought content normal.  Nursing note and vitals reviewed.   Imaging: No results found.  Labs:  CBC: Recent Labs    04/19/17 0817 04/21/17 0730 04/22/17 0606 04/23/17 0755  WBC 4.9 5.3 6.2 6.8  HGB 8.6* 9.6* 10.1* 10.2*  HCT 28.4* 31.6* 33.3* 33.6*  PLT 313 308 275 315    COAGS: Recent Labs    12/03/16 1818  12/08/16 0734 03/21/17 0254 03/21/17 1630 04/16/17 2014  04/17/17 0648 04/17/17 1705  INR 1.14  --   --  1.05 0.97  --   --   --   APTT  --    < > 77*  --   --  41* 49* 83*   < > = values in this interval not displayed.    BMP: Recent Labs    04/19/17 0817 04/21/17 0550 04/21/17 0730 04/23/17 0755  NA 141 140 140 139  K 3.5 3.7 3.8 4.1  CL 107 106 105 104  CO2 22 23 24 23   GLUCOSE 92 98 94 84  BUN 23* 22* 22* 31*  CALCIUM 8.1* 8.6* 8.6* 9.2  CREATININE 9.22* 7.69* 7.80* 7.55*  GFRNONAA 4* 5* 5* 6*  GFRAA 5* 6* 6* 6*    LIVER FUNCTION TESTS: Recent Labs    12/03/16 1818 03/21/17 1630 04/16/17 0442  04/19/17 0817 04/21/17 0550 04/21/17 0730 04/23/17 0755  BILITOT 0.5 0.8 0.8  --   --   --   --   --   AST 41 21 17  --   --   --   --   --   ALT 21 8* 12*  --   --   --   --   --   ALKPHOS 137* 40 55  --   --   --   --   --   PROT 8.1 6.6 7.7  --   --   --   --   --   ALBUMIN 3.1* 3.6 3.5   < > 2.6* 2.8* 2.8* 3.0*   < > = values in this interval not displayed.    TUMOR MARKERS: No results for input(s): AFPTM, CEA, CA199, CHROMGRNA in the last 8760 hours.  Assessment and Plan:  Dialysis access-- Rt IJ tunneled catheter malfunction Scheduled for exchange today Pt is aware of procedure benefits and risks including but not limited to Infection; bleeding; vessel damage Agreeable to proceed Consent signed and in chart  Thank you for this interesting consult.  I greatly enjoyed meeting Rahcel Shutes and look forward to participating in  their care.  A copy of this report was sent to the requesting provider on this date.  Electronically Signed: Lavonia Drafts, PA-C 06/07/2017, 9:16 AM   I spent a total of  30 Minutes   in face to face in clinical consultation, greater than 50% of which was counseling/coordinating care for HD cath exchange

## 2017-06-07 NOTE — Procedures (Signed)
Esrd, nonfunctioning HD cath  S/p HD cath exchg and repostion S/p svc and ivc venograms S/p svc 38mm venous pta of fibrin sheath  EBL MIN No comp Stable Full report in pacs

## 2017-06-10 ENCOUNTER — Encounter (HOSPITAL_COMMUNITY): Payer: Self-pay | Admitting: Emergency Medicine

## 2017-06-10 ENCOUNTER — Inpatient Hospital Stay (HOSPITAL_COMMUNITY)
Admission: EM | Admit: 2017-06-10 | Discharge: 2017-06-15 | DRG: 690 | Disposition: A | Discharge status: Skilled Nursing Facility | Payer: Medicare Other | Attending: Family Medicine | Admitting: Family Medicine

## 2017-06-10 DIAGNOSIS — Z905 Acquired absence of kidney: Secondary | ICD-10-CM

## 2017-06-10 DIAGNOSIS — D649 Anemia, unspecified: Secondary | ICD-10-CM | POA: Diagnosis present

## 2017-06-10 DIAGNOSIS — E669 Obesity, unspecified: Secondary | ICD-10-CM | POA: Diagnosis present

## 2017-06-10 DIAGNOSIS — Z992 Dependence on renal dialysis: Secondary | ICD-10-CM

## 2017-06-10 DIAGNOSIS — E8889 Other specified metabolic disorders: Secondary | ICD-10-CM | POA: Diagnosis present

## 2017-06-10 DIAGNOSIS — Z8541 Personal history of malignant neoplasm of cervix uteri: Secondary | ICD-10-CM

## 2017-06-10 DIAGNOSIS — Z6841 Body Mass Index (BMI) 40.0 and over, adult: Secondary | ICD-10-CM

## 2017-06-10 DIAGNOSIS — I82409 Acute embolism and thrombosis of unspecified deep veins of unspecified lower extremity: Secondary | ICD-10-CM | POA: Diagnosis present

## 2017-06-10 DIAGNOSIS — D638 Anemia in other chronic diseases classified elsewhere: Secondary | ICD-10-CM | POA: Diagnosis present

## 2017-06-10 DIAGNOSIS — R109 Unspecified abdominal pain: Secondary | ICD-10-CM | POA: Diagnosis present

## 2017-06-10 DIAGNOSIS — N12 Tubulo-interstitial nephritis, not specified as acute or chronic: Secondary | ICD-10-CM | POA: Diagnosis not present

## 2017-06-10 DIAGNOSIS — Z8041 Family history of malignant neoplasm of ovary: Secondary | ICD-10-CM

## 2017-06-10 DIAGNOSIS — N186 End stage renal disease: Secondary | ICD-10-CM | POA: Diagnosis present

## 2017-06-10 DIAGNOSIS — N2581 Secondary hyperparathyroidism of renal origin: Secondary | ICD-10-CM | POA: Diagnosis present

## 2017-06-10 DIAGNOSIS — Z86718 Personal history of other venous thrombosis and embolism: Secondary | ICD-10-CM

## 2017-06-10 DIAGNOSIS — A419 Sepsis, unspecified organism: Secondary | ICD-10-CM

## 2017-06-10 DIAGNOSIS — Z7901 Long term (current) use of anticoagulants: Secondary | ICD-10-CM

## 2017-06-10 DIAGNOSIS — Z833 Family history of diabetes mellitus: Secondary | ICD-10-CM

## 2017-06-10 DIAGNOSIS — I120 Hypertensive chronic kidney disease with stage 5 chronic kidney disease or end stage renal disease: Secondary | ICD-10-CM | POA: Diagnosis present

## 2017-06-10 DIAGNOSIS — Z8543 Personal history of malignant neoplasm of ovary: Secondary | ICD-10-CM

## 2017-06-10 DIAGNOSIS — Z8249 Family history of ischemic heart disease and other diseases of the circulatory system: Secondary | ICD-10-CM

## 2017-06-10 LAB — COMPREHENSIVE METABOLIC PANEL
ALBUMIN: 3.3 g/dL — AB (ref 3.5–5.0)
ALT: <5 U/L — ABNORMAL LOW (ref 14–54)
ANION GAP: 15 (ref 5–15)
AST: 14 U/L — AB (ref 15–41)
Alkaline Phosphatase: 51 U/L (ref 38–126)
BUN: 36 mg/dL — AB (ref 6–20)
CHLORIDE: 99 mmol/L — AB (ref 101–111)
CO2: 26 mmol/L (ref 22–32)
Calcium: 9.9 mg/dL (ref 8.9–10.3)
Creatinine, Ser: 8.79 mg/dL — ABNORMAL HIGH (ref 0.44–1.00)
GFR calc Af Amer: 5 mL/min — ABNORMAL LOW (ref >60)
GFR calc non Af Amer: 5 mL/min — ABNORMAL LOW (ref >60)
Glucose, Bld: 100 mg/dL — ABNORMAL HIGH (ref 65–99)
POTASSIUM: 4.4 mmol/L (ref 3.5–5.1)
Sodium: 140 mmol/L (ref 135–145)
Total Bilirubin: 0.4 mg/dL (ref 0.3–1.2)
Total Protein: 7.5 g/dL (ref 6.5–8.1)

## 2017-06-10 LAB — CBC
HEMATOCRIT: 33.3 % — AB (ref 36.0–46.0)
HEMOGLOBIN: 10.1 g/dL — AB (ref 12.0–15.0)
MCH: 29.8 pg (ref 26.0–34.0)
MCHC: 30.3 g/dL (ref 30.0–36.0)
MCV: 98.2 fL (ref 78.0–100.0)
Platelets: 197 10*3/uL (ref 150–400)
RBC: 3.39 MIL/uL — ABNORMAL LOW (ref 3.87–5.11)
RDW: 16.5 % — AB (ref 11.5–15.5)
WBC: 9 10*3/uL (ref 4.0–10.5)

## 2017-06-10 LAB — LIPASE, BLOOD: LIPASE: 41 U/L (ref 11–51)

## 2017-06-10 NOTE — ED Triage Notes (Addendum)
Pt c/o pain RUQ onset yesterday with nausea after dialysis.  Pt is HD pt with Permcath R chest, T/TH/S, full treatment yesterday. Pt reports abnormal belching and flatulence.  Pt had permcath replaced Tuesday 06/07/17 [facility] IR

## 2017-06-11 ENCOUNTER — Emergency Department (HOSPITAL_COMMUNITY): Payer: Medicare Other

## 2017-06-11 ENCOUNTER — Other Ambulatory Visit: Payer: Self-pay

## 2017-06-11 ENCOUNTER — Encounter (HOSPITAL_COMMUNITY): Payer: Self-pay | Admitting: Internal Medicine

## 2017-06-11 DIAGNOSIS — D649 Anemia, unspecified: Secondary | ICD-10-CM | POA: Diagnosis present

## 2017-06-11 DIAGNOSIS — Z86718 Personal history of other venous thrombosis and embolism: Secondary | ICD-10-CM | POA: Diagnosis not present

## 2017-06-11 DIAGNOSIS — Z8041 Family history of malignant neoplasm of ovary: Secondary | ICD-10-CM | POA: Diagnosis not present

## 2017-06-11 DIAGNOSIS — I82409 Acute embolism and thrombosis of unspecified deep veins of unspecified lower extremity: Secondary | ICD-10-CM | POA: Diagnosis present

## 2017-06-11 DIAGNOSIS — E669 Obesity, unspecified: Secondary | ICD-10-CM | POA: Diagnosis present

## 2017-06-11 DIAGNOSIS — Z8249 Family history of ischemic heart disease and other diseases of the circulatory system: Secondary | ICD-10-CM | POA: Diagnosis not present

## 2017-06-11 DIAGNOSIS — Z992 Dependence on renal dialysis: Secondary | ICD-10-CM | POA: Diagnosis not present

## 2017-06-11 DIAGNOSIS — D638 Anemia in other chronic diseases classified elsewhere: Secondary | ICD-10-CM | POA: Diagnosis present

## 2017-06-11 DIAGNOSIS — N12 Tubulo-interstitial nephritis, not specified as acute or chronic: Principal | ICD-10-CM

## 2017-06-11 DIAGNOSIS — N186 End stage renal disease: Secondary | ICD-10-CM | POA: Diagnosis present

## 2017-06-11 DIAGNOSIS — Z6841 Body Mass Index (BMI) 40.0 and over, adult: Secondary | ICD-10-CM | POA: Diagnosis not present

## 2017-06-11 DIAGNOSIS — E8889 Other specified metabolic disorders: Secondary | ICD-10-CM | POA: Diagnosis present

## 2017-06-11 DIAGNOSIS — Z8541 Personal history of malignant neoplasm of cervix uteri: Secondary | ICD-10-CM | POA: Diagnosis not present

## 2017-06-11 DIAGNOSIS — N2581 Secondary hyperparathyroidism of renal origin: Secondary | ICD-10-CM | POA: Diagnosis present

## 2017-06-11 DIAGNOSIS — Z905 Acquired absence of kidney: Secondary | ICD-10-CM | POA: Diagnosis not present

## 2017-06-11 DIAGNOSIS — I120 Hypertensive chronic kidney disease with stage 5 chronic kidney disease or end stage renal disease: Secondary | ICD-10-CM | POA: Diagnosis present

## 2017-06-11 DIAGNOSIS — R109 Unspecified abdominal pain: Secondary | ICD-10-CM | POA: Diagnosis present

## 2017-06-11 DIAGNOSIS — Z833 Family history of diabetes mellitus: Secondary | ICD-10-CM | POA: Diagnosis not present

## 2017-06-11 DIAGNOSIS — Z7901 Long term (current) use of anticoagulants: Secondary | ICD-10-CM | POA: Diagnosis not present

## 2017-06-11 DIAGNOSIS — Z8543 Personal history of malignant neoplasm of ovary: Secondary | ICD-10-CM | POA: Diagnosis not present

## 2017-06-11 DIAGNOSIS — T82898A Other specified complication of vascular prosthetic devices, implants and grafts, initial encounter: Secondary | ICD-10-CM

## 2017-06-11 LAB — URINALYSIS, ROUTINE W REFLEX MICROSCOPIC
BILIRUBIN URINE: NEGATIVE
GLUCOSE, UA: NEGATIVE mg/dL
KETONES UR: NEGATIVE mg/dL
NITRITE: NEGATIVE
PH: 8 (ref 5.0–8.0)
Protein, ur: 100 mg/dL — AB
Specific Gravity, Urine: 1.013 (ref 1.005–1.030)

## 2017-06-11 LAB — TROPONIN I: Troponin I: <0.03 ng/mL (ref <0.03)

## 2017-06-11 LAB — I-STAT CG4 LACTIC ACID, ED
Lactic Acid, Venous: 0.97 mmol/L (ref 0.5–1.9)
Lactic Acid, Venous: 1.21 mmol/L (ref 0.5–1.9)

## 2017-06-11 MED ORDER — RENA-VITE PO TABS
1.0000 | ORAL_TABLET | Freq: Every day | ORAL | Status: DC
  Administered 2017-06-11 – 2017-06-14 (×4): 1 via ORAL
  Filled 2017-06-11 (×4): qty 1

## 2017-06-11 MED ORDER — SODIUM CHLORIDE 0.9 % IV SOLN
500.0000 mg | INTRAVENOUS | Status: DC
  Administered 2017-06-11: 500 mg via INTRAVENOUS
  Filled 2017-06-11 (×4): qty 0.5

## 2017-06-11 MED ORDER — HYDROCODONE-ACETAMINOPHEN 5-325 MG PO TABS
1.0000 | ORAL_TABLET | ORAL | Status: DC | PRN
  Administered 2017-06-11 – 2017-06-15 (×16): 2 via ORAL
  Filled 2017-06-11 (×15): qty 2

## 2017-06-11 MED ORDER — SERTRALINE HCL 50 MG PO TABS
50.0000 mg | ORAL_TABLET | Freq: Every day | ORAL | Status: DC
  Administered 2017-06-11 – 2017-06-14 (×4): 50 mg via ORAL
  Filled 2017-06-11 (×4): qty 1

## 2017-06-11 MED ORDER — LANTHANUM CARBONATE 500 MG PO CHEW
1000.0000 mg | CHEWABLE_TABLET | Freq: Three times a day (TID) | ORAL | Status: DC
  Administered 2017-06-11 – 2017-06-15 (×11): 1000 mg via ORAL
  Filled 2017-06-11 (×12): qty 2

## 2017-06-11 MED ORDER — GABAPENTIN 100 MG PO CAPS
100.0000 mg | ORAL_CAPSULE | Freq: Every day | ORAL | Status: DC
  Administered 2017-06-11 – 2017-06-14 (×4): 100 mg via ORAL
  Filled 2017-06-11 (×4): qty 1

## 2017-06-11 MED ORDER — ONDANSETRON HCL 4 MG/2ML IJ SOLN
4.0000 mg | Freq: Four times a day (QID) | INTRAMUSCULAR | Status: DC | PRN
  Administered 2017-06-11 – 2017-06-12 (×2): 4 mg via INTRAVENOUS
  Filled 2017-06-11: qty 2

## 2017-06-11 MED ORDER — CALCITRIOL 0.25 MCG PO CAPS
0.7500 ug | ORAL_CAPSULE | ORAL | Status: DC
  Administered 2017-06-11 – 2017-06-14 (×2): 0.75 ug via ORAL
  Filled 2017-06-11 (×2): qty 3

## 2017-06-11 MED ORDER — PROMETHAZINE HCL 25 MG PO TABS
25.0000 mg | ORAL_TABLET | Freq: Four times a day (QID) | ORAL | Status: DC | PRN
  Administered 2017-06-12 – 2017-06-13 (×2): 25 mg via ORAL
  Filled 2017-06-11 (×2): qty 1

## 2017-06-11 MED ORDER — APIXABAN 5 MG PO TABS
5.0000 mg | ORAL_TABLET | Freq: Two times a day (BID) | ORAL | Status: DC
  Administered 2017-06-11 – 2017-06-13 (×5): 5 mg via ORAL
  Filled 2017-06-11 (×5): qty 1

## 2017-06-11 MED ORDER — SODIUM CHLORIDE 0.9 % IV BOLUS
500.0000 mL | Freq: Once | INTRAVENOUS | Status: AC
  Administered 2017-06-11: 500 mL via INTRAVENOUS

## 2017-06-11 MED ORDER — ONDANSETRON HCL 4 MG/2ML IJ SOLN
4.0000 mg | Freq: Once | INTRAMUSCULAR | Status: AC
  Administered 2017-06-11: 4 mg via INTRAVENOUS
  Filled 2017-06-11: qty 2

## 2017-06-11 MED ORDER — MORPHINE SULFATE (PF) 4 MG/ML IV SOLN
4.0000 mg | Freq: Once | INTRAVENOUS | Status: AC
  Administered 2017-06-11: 4 mg via INTRAVENOUS
  Filled 2017-06-11: qty 1

## 2017-06-11 MED ORDER — SENNOSIDES-DOCUSATE SODIUM 8.6-50 MG PO TABS: 1.0000 | ORAL_TABLET | Freq: Every evening | ORAL | Status: DC | PRN

## 2017-06-11 MED ORDER — ONDANSETRON HCL 4 MG PO TABS
4.0000 mg | ORAL_TABLET | Freq: Four times a day (QID) | ORAL | Status: DC | PRN
  Filled 2017-06-11: qty 1

## 2017-06-11 MED ORDER — SODIUM CHLORIDE 0.9 % IV SOLN
1.0000 g | Freq: Once | INTRAVENOUS | Status: AC
  Administered 2017-06-11: 1 g via INTRAVENOUS
  Filled 2017-06-11 (×2): qty 1

## 2017-06-11 MED ORDER — CINACALCET HCL 30 MG PO TABS
90.0000 mg | ORAL_TABLET | Freq: Every day | ORAL | Status: DC
  Administered 2017-06-11 – 2017-06-15 (×5): 90 mg via ORAL
  Filled 2017-06-11 (×5): qty 3

## 2017-06-11 MED ORDER — ACETAMINOPHEN 500 MG PO TABS: 1000.0000 mg | ORAL_TABLET | Freq: Four times a day (QID) | ORAL | Status: DC | PRN

## 2017-06-11 NOTE — H&P (Addendum)
History and Physical    Ronna Herskowitz JME:[id number] DOB: [birth year] DOA: 06/10/2017  PCP: System, Pcp Not In Patient coming from: home  Chief Complaint: abdominal pain  HPI: Judy Kelley is a very pleasant 52 y.o. 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 Initial evaluation concerning for pyelonephritis. [facility] are asked to admit.  Information is obtained from the patient and the chart. She reports her hemodialysis catheter was exchanged 4 days ago. She also has a nephrostomy tube has not been changed since April. She developed right flank pain and abdominal pain with nausea without vomiting. She reports this discomfort is quite similar to pain she's had in the past with pyelonephritis. Max temperature at home was 100.6. She states the pain is constant located in her right flank that radiates to right abdomen. He describes the pain as an "ache" and rates it a 1010. She states nephrostomy output stable. She denies headache dizziness syncope or near-syncope. She denies chest pain palpitation shortness of breath. She denies diarrhea constipation melena bright red blood per rectum.    ED Course: in the emergency department she's afebrile hemodynamically stable and not hypoxic. She receives antibiotics and morphine. At the time of admission she reports pain more manageable.  Review of Systems: As per  HPI otherwise all other systems reviewed and are negative.   Ambulatory Status:ambulates independently is independent with ADLs  Past Medical History:  Diagnosis Date  . Anemia   . Anxiety   . Cervical cancer (HCC)    Cervical and ovarian  . DVT (deep venous thrombosis) ([place])   . History of kidney stones   . Hypertension    treated years ago, on no medications for at least years ( as of 2019)  . Renal disorder    ESRD - stage 5 Dialysis T/Th/Sa  . Renal insufficiency     Past Surgical History:  Procedure Laterality Date  . AV FISTULA PLACEMENT Right 03/21/2017   Procedure: INSERTION OF ARTERIOVENOUS (AV) GORE-TEX GRAFT RIGHT THIGH;  Surgeon: Angelia Mould, MD;  Location: [place];  Service: Vascular;  Laterality: Right;  . CYSTOSCOPY N/A 12/09/2016   Procedure: ENDOSCOPY OF ILEAL CONDUIT;  Surgeon: Irine Seal, MD;  Location: [place];  Service: Urology;  Laterality: N/A;  . GRAFT APPLICATION Right 22/97/9892   INSERTION OF ARTERIOVENOUS (AV) GORE-TEX GRAFT RIGHT THIGH   . ileal conduit    . IR FLUORO GUIDE CV LINE RIGHT  12/09/2016  . IR FLUORO GUIDE CV LINE RIGHT  06/07/2017  . IR NEPHROSTOMY EXCHANGE RIGHT  01/21/2017  . IR NEPHROSTOMY EXCHANGE RIGHT  04/13/2017  . IR PTA VENOUS EXCEPT DIALYSIS CIRCUIT  06/07/2017  . IR THROMBECTOMY AV FISTULA W/THROMBOLYSIS/PTA/STENT INC/SHUNT/IMG RT Right 12/09/2016  . IR US GUIDE VASC ACCESS RIGHT  12/09/2016  . IR US GUIDE VASC ACCESS RIGHT  12/09/2016  . IR VENOCAVAGRAM IVC  06/07/2017  . nephrosotomy    . TONSILLECTOMY    . TUBAL LIGATION      Social History  Socioeconomic History  . Marital status: Single    Spouse name: Not on file  . Number of children: Not on file  . Years of education: Not on file  . Highest education level: Not on file  Occupational History  . Not on file  Social Needs  . Financial resource strain: Not on file  . Food insecurity:    Worry: Not on file    Inability: Not on file  . Transportation needs:     Medical: Not on file    Non-medical: Not on file  Tobacco Use  . Smoking status: Never Smoker  . Smokeless tobacco: Never Used  Substance and Sexual Activity  . Alcohol use: Yes    Frequency: Never    Comment: occasional wine  . Drug use: No  . Sexual activity: Not on file  Lifestyle  . Physical activity:    Days per week: Not on file    Minutes per session: Not on file  . Stress: Not on file  Relationships  . Social connections:    Talks on phone: Not on file    Gets together: Not on file    Attends religious service: Not on file    Active member of club or organization: Not on file    Attends meetings of clubs or organizations: Not on file    Relationship status: Not on file  . Intimate partner violence:    Fear of current or ex partner: Not on file    Emotionally abused: Not on file    Physically abused: Not on file    Forced sexual activity: Not on file  Other Topics Concern  . Not on file  Social History Narrative  . Not on file    Allergies  Allergen Reactions  . Ciprofloxacin Other (See Comments)    IV only-burning to her veins, can take PO  . Erythromycin Anaphylaxis  . Chlorhexidine Rash  . Other Rash and Other (See Comments)    chloraprep    Family History  Problem Relation Age of Onset  . Diabetes Mellitus II Mother   . Kidney disease Mother   . Heart disease Mother   . Rheum arthritis Mother   . COPD Mother   . Diabetes Mellitus II Father   . Heart disease Father   . Dementia Father   . Ovarian cancer Maternal Aunt     Prior to Admission medications   Medication Sig Start Date End Date Taking? Authorizing Provider  acetaminophen (TYLENOL) 500 MG tablet Take 1,000 mg by mouth every 6 (six) hours as needed for mild pain or headache.    Yes [provider]  apixaban (ELIQUIS) 5 MG TABS tablet Take 5 mg by mouth 2 (two) times daily.   Yes [provider]  cinacalcet (SENSIPAR) 90 MG tablet Take 90 mg by mouth daily.   Yes  [provider]  gabapentin (NEURONTIN) 100 MG capsule Take 100 mg by mouth at bedtime.   Yes [provider]  lanthanum (FOSRENOL) 1000 MG chewable tablet Chew 1 tablet (1,000 mg total) by mouth 3 (three) times daily with meals. 04/23/17  Yes Patrecia Pour, MD  loperamide (IMODIUM) 2 MG capsule Take 1 capsule (2 mg total) by mouth as needed for diarrhea or loose stools. 04/23/17  Yes Patrecia Pour, MD  multivitamin (RENA-VIT) TABS tablet Take 1 tablet by mouth daily.   Yes [provider]  mupirocin ointment (BACTROBAN) 2 % Apply 1 application topically 2 (two) times  daily as needed (rash).    Yes [provider]  promethazine (PHENERGAN) 25 MG tablet Take 25 mg by mouth every 6 (six) hours as needed for nausea or vomiting.   Yes [provider]  sertraline (ZOLOFT) 50 MG tablet Take 50 mg by mouth at bedtime.    Yes [provider]    Physical Exam: Vitals:   06/11/17 0530 06/11/17 0545 06/11/17 0600 06/11/17 0615  BP: 113/77 134/71 (!) 92/54 112/76  Pulse:  99 67 69  Resp: 20 18 18 19   Temp:      TempSrc:      SpO2:  (!) 81% 95% 95%     General:  Appears calm and comfortable Eyes:  PERRL, EOMI, normal lids, iris ENT:  grossly normal hearing, lips & tongue, very poor dentition mucous membranes of her mouth are moist and pink Neck:  no LAD, masses or thyromegaly Cardiovascular:  RRR, no m/r/g. No LE edema.  Respiratory:  CTA bilaterally, no w/r/r. Normal respiratory effort. Abdomen:  soft, positive bowel sounds throughout mild diffuse tenderness particularly right lower quadrant right-sided nephrostomysite without redness Skin:  no rash or induration seen on limited exam Musculoskeletal:  grossly normal tone BUE/BLE, good ROM, no bony abnormality Psychiatric:  grossly normal mood and affect, speech fluent and appropriate, AOx3 Neurologic:  CN 2-12 grossly intact, moves all extremities in coordinated fashion, sensation intact  Labs  on Admission: I have personally reviewed following labs and imaging studies  CBC: Recent Labs  Lab 06/10/17 2100  WBC 9.0  HGB 10.1*  HCT 33.3*  MCV 98.2  PLT 914   Basic Metabolic Panel: Recent Labs  Lab 06/10/17 2100  NA 140  K 4.4  CL 99*  CO2 26  GLUCOSE 100*  BUN 36*  CREATININE 8.79*  CALCIUM 9.9   GFR: CrCl cannot be calculated (Unknown ideal weight.). Liver Function Tests: Recent Labs  Lab 06/10/17 2100  AST 14*  ALT <5*  ALKPHOS 51  BILITOT 0.4  PROT 7.5  ALBUMIN 3.3*   Recent Labs  Lab 06/10/17 2100  LIPASE 41   No results for input(s): AMMONIA in the last 168 hours. Coagulation Profile: No results for input(s): INR, PROTIME in the last 168 hours. Cardiac Enzymes: Recent Labs  Lab 06/11/17 0515  TROPONINI <0.03   BNP (last 3 results) No results for input(s): PROBNP in the last 8760 hours. HbA1C: No results for input(s): HGBA1C in the last 72 hours. CBG: No results for input(s): GLUCAP in the last 168 hours. Lipid Profile: No results for input(s): CHOL, HDL, LDLCALC, TRIG, CHOLHDL, LDLDIRECT in the last 72 hours. Thyroid Function Tests: No results for input(s): TSH, T4TOTAL, FREET4, T3FREE, THYROIDAB in the last 72 hours. Anemia Panel: No results for input(s): VITAMINB12, FOLATE, FERRITIN, TIBC, IRON, RETICCTPCT in the last 72 hours. Urine analysis:    Component Value Date/Time   COLORURINE YELLOW 06/11/2017 0016   APPEARANCEUR CLOUDY (A) 06/11/2017 0016   LABSPEC 1.013 06/11/2017 0016   PHURINE 8.0 06/11/2017 0016   GLUCOSEU NEGATIVE 06/11/2017 0016   HGBUR SMALL (A) 06/11/2017 0016   BILIRUBINUR NEGATIVE 06/11/2017 0016   KETONESUR NEGATIVE 06/11/2017 0016   PROTEINUR 100 (A) 06/11/2017 0016   NITRITE NEGATIVE 06/11/2017 0016   LEUKOCYTESUR LARGE (A) 06/11/2017 0016    Creatinine Clearance: CrCl cannot be calculated (Unknown ideal weight.).  Sepsis Labs: @LABRCNTIP (procalcitonin:4,lacticidven:4) )No results found for this  or any previous visit (from the past 240 hour(s)).   Radiological Exams on Admission: Dg  Chest 2 View  Result Date: 06/11/2017 CLINICAL DATA:  Acute onset of right upper quadrant abdominal pain and nausea after dialysis. EXAM: CHEST - 2 VIEW COMPARISON:  Chest radiograph performed 04/17/2017 FINDINGS: The lungs are well-aerated and clear. There is no evidence of focal opacification, pleural effusion or pneumothorax. The heart is normal in size; the mediastinal contour is within normal limits. No acute osseous abnormalities are seen. A right-sided dual-lumen catheter is noted ending about the cavoatrial junction. Vascular stents are noted overlying the right upper arm. Clips are seen at the left axilla. The patient's right-sided nephrostomy tube is partially characterized. IMPRESSION: No acute cardiopulmonary process seen. Electronically Signed   By: Garald Balding M.D.   On: 06/11/2017 05:14   Ct Renal Stone Study  Result Date: 06/11/2017 CLINICAL DATA:  Acute onset of right upper quadrant abdominal pain and nausea after dialysis. Abdominal belching and flatulence. EXAM: CT ABDOMEN AND PELVIS WITHOUT CONTRAST TECHNIQUE: Multidetector CT imaging of the abdomen and pelvis was performed following the standard protocol without IV contrast. COMPARISON:  CT of the abdomen and pelvis from 04/16/2017 FINDINGS: Lower chest: The visualized lung bases are grossly clear. The visualized portions of the mediastinum are unremarkable. Hepatobiliary: The liver is unremarkable in appearance. The gallbladder is unremarkable in appearance. The common bile duct remains normal in caliber. Pancreas: The pancreas is within normal limits. Spleen: The spleen is unremarkable in appearance. Adrenals/Urinary Tract: The adrenal glands are unremarkable in appearance. The patient is status post left-sided nephrectomy. There is moderate right renal atrophy, with underlying nephrostomy stent. No hydronephrosis is seen. Mild nonspecific  right-sided perinephric stranding is noted. A postoperative seroma is noted at the left renal bed. No renal or ureteral stones are identified. 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 The appendix is not characterized; there is no evidence for appendicitis. The small bowel is grossly unremarkable. The stomach is within normal limits. Vascular/Lymphatic: Scattered calcification is seen along the abdominal aorta and its branches. The abdominal aorta is otherwise grossly unremarkable. Soft tissue inflammation is noted about the infrahepatic IVC, of uncertain significance. No retroperitoneal lymphadenopathy is seen. No pelvic sidewall lymphadenopathy is identified. Reproductive: The patient is status post cystectomy. The uterus is grossly unremarkable. No suspicious adnexal masses are seen. The soft tissue inflammation about the pelvis likely reflects postoperative change. Other: Diffuse subcutaneous varices are noted along the left anterior abdominal wall. Musculoskeletal: No acute osseous abnormalities are identified. The visualized musculature is unremarkable in appearance. IMPRESSION: 1. Soft tissue inflammation about the infrahepatic IVC, of uncertain significance. Mild infection cannot be excluded. Would correlate clinically to exclude underlying thrombophlebitis. 2. 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 No evidence for obstruction. 3. Moderate right renal atrophy, with underlying nephrostomy stent. 4. Diffuse subcutaneous varices along the left anterior abdominal wall. Aortic Atherosclerosis (ICD10-I70.0). Electronically Signed   By: Garald Balding M.D.   On: 06/11/2017 05:13    EKG: Independently reviewed. Normal sinus rhythm Low voltage QRS Borderline ECG  Assessment/Plan Principal Problem:   Pyelonephritis Active  Problems:   ESRD (end stage renal disease) (HCC)   Chronic anticoagulation   Abdominal pain   DVT (deep venous thrombosis) (HCC)   Anemia   History of ovarian cancer   #1.abdominal pain/pyelonephritis. Patient with a history of multiple infections during MRSA. To 2 reveals soft tissue inflammation about the infrahepatic IVC of uncertain  significance but concerning for infection.   No Leukocytosis lactic acid within the limits of normal hemodynamically stable. Of note recent pyelonephritis due to ESBL E coli. At that time given meropenem x 7 days and single dose fosfomycin as mop up per chart review.  Meropenem initiated in ED -admit - continue meropenem -follow urine culture -follow blood culture  #2. End-stage renal disease. She is a Tuesday Thursday Saturday dialysis patient. Is due for dialysis today. Creatinine 8.79 -nephrology consult  #3. History of DVT/chronic anticoagulation. Patient has been on Eliquis and reports compliance. She reports a history of multiple episodes of clotted graft and fistula in spite of elequis. CT Soft tissue inflammation about the infrahepatic IVC, of uncertain significance. Mild infection cannot be excluded. Would correlate clinically to exclude underlying thrombophlebitis. Chart review indicates patient seen by vascular surgery at [facility] -continue eliquis  -will request vascular input  #4. Anemia of chronic disease.hemoglobin stable.  No Signs symptoms of active bleeding -monitor    DVT prophylaxis: eliquis Code Status: full  Family Communication: none present  Disposition Plan: inpatient  Consults called: nephrology scherts, dr fields vascular Admission status: inpatient    Radene Gunning MD [facility]  If 7PM-7AM, please contact night-coverage [email or web address] Password [facility]  06/11/2017, 7:52 AM

## 2017-06-11 NOTE — ED Notes (Signed)
Delay in lab draw,  Pt not in room 

## 2017-06-11 NOTE — Consult Note (Signed)
Referring Physician: Dyanne Carrel NP  Patient name: Judy Kelley MRN: [id number] DOB: 11-Feb-1965 Sex: female  REASON FOR CONSULT: inflammation of IVC  HPI: Judy Kelley is a 52 y.o. female with chronic inferior vena cava occlusion.  Had instrumentation recently of her SVC for placement of dialysis catheter by IR and noted to have occluded IVC when attempting to pass wire through this.  Pt presented with abdominal pain.  CT showed possible IVC inflammation. Her admission diagnosis is pyelonephritis.    Past Medical History:  Diagnosis Date  . Anemia   . Anxiety   . Cervical cancer (HCC)    Cervical and ovarian  . DVT (deep venous thrombosis) ([place])   . History of kidney stones   . Hypertension    treated years ago, on no medications for at least years ( as of 2019)  . Renal disorder    ESRD - stage 5 Dialysis T/Th/Sa  . Renal insufficiency    Past Surgical History:  Procedure Laterality Date  . AV FISTULA PLACEMENT Right 03/21/2017   Procedure: INSERTION OF ARTERIOVENOUS (AV) GORE-TEX GRAFT RIGHT THIGH;  Surgeon: Angelia Mould, MD;  Location: [facility];  Service: Vascular;  Laterality: Right;  . CYSTOSCOPY N/A 12/09/2016   Procedure: ENDOSCOPY OF ILEAL CONDUIT;  Surgeon: Irine Seal, MD;  Location: [place];  Service: Urology;  Laterality: N/A;  . GRAFT APPLICATION Right 93/23/5573   INSERTION OF ARTERIOVENOUS (AV) GORE-TEX GRAFT RIGHT THIGH   . ileal conduit    . IR FLUORO GUIDE CV LINE RIGHT  12/09/2016  . IR FLUORO GUIDE CV LINE RIGHT  06/07/2017  . IR NEPHROSTOMY EXCHANGE RIGHT  01/21/2017  . IR NEPHROSTOMY EXCHANGE RIGHT  04/13/2017  . IR PTA VENOUS EXCEPT DIALYSIS CIRCUIT  06/07/2017  . IR THROMBECTOMY AV FISTULA W/THROMBOLYSIS/PTA/STENT INC/SHUNT/IMG RT Right 12/09/2016  . IR US GUIDE VASC ACCESS RIGHT  12/09/2016  . IR US GUIDE VASC ACCESS RIGHT  12/09/2016  . IR VENOCAVAGRAM IVC  06/07/2017  . nephrosotomy    . TONSILLECTOMY    . TUBAL LIGATION      Family History    Problem Relation Age of Onset  . Diabetes Mellitus II Mother   . Kidney disease Mother   . Heart disease Mother   . Rheum arthritis Mother   . COPD Mother   . Diabetes Mellitus II Father   . Heart disease Father   . Dementia Father   . Ovarian cancer Maternal Aunt     SOCIAL HISTORY: Social History   Socioeconomic History  . Marital status: Single    Spouse name: Not on file  . Number of children: Not on file  . Years of education: Not on file  . Highest education level: Not on file  Occupational History  . Not on file  Social Needs  . Financial resource strain: Not on file  . Food insecurity:    Worry: Not on file    Inability: Not on file  . Transportation needs:    Medical: Not on file    Non-medical: Not on file  Tobacco Use  . Smoking status: Never Smoker  . Smokeless tobacco: Never Used  Substance and Sexual Activity  . Alcohol use: Yes    Frequency: Never    Comment: occasional wine  . Drug use: No  . Sexual activity: Not on file  Lifestyle  . Physical activity:    Days per week: Not on file    Minutes per session: Not on  file  . Stress: Not on file  Relationships  . Social connections:    Talks on phone: Not on file    Gets together: Not on file    Attends religious service: Not on file    Active member of club or organization: Not on file    Attends meetings of clubs or organizations: Not on file    Relationship status: Not on file  . Intimate partner violence:    Fear of current or ex partner: Not on file    Emotionally abused: Not on file    Physically abused: Not on file    Forced sexual activity: Not on file  Other Topics Concern  . Not on file  Social History Narrative  . Not on file    Allergies  Allergen Reactions  . Ciprofloxacin Other (See Comments)    IV only-burning to her veins, can take PO  . Erythromycin Anaphylaxis  . Chlorhexidine Rash  . Other Rash and Other (See Comments)    chloraprep    Current  Facility-Administered Medications  Medication Dose Route Frequency Provider Last Rate Last Dose  . acetaminophen (TYLENOL) tablet 1,000 mg  1,000 mg Oral Q6H PRN Radene Gunning, NP      . apixaban [facility]) tablet 5 mg  5 mg Oral BID Radene Gunning, NP   5 mg at 06/11/17 0939  . calcitRIOL (ROCALTROL) capsule 0.75 mcg  0.75 mcg Oral Q T,Th,Sa-HD Ejigiri, Thomos Lemons, PA-C      . cinacalcet [facility]) tablet 90 mg  90 mg Oral Q breakfast Radene Gunning, NP   90 mg at 06/11/17 0940  . gabapentin (NEURONTIN) capsule 100 mg  100 mg Oral QHS Black, Lezlie Octave, NP      . HYDROcodone-acetaminophen (NORCO/VICODIN) 5-325 MG per tablet 1-2 tablet  1-2 tablet Oral Q4H PRN Radene Gunning, NP   2 tablet at 06/11/17 [phone]  . lanthanum (FOSRENOL) chewable tablet 1,000 mg  1,000 mg Oral TID WC Radene Gunning, NP   1,000 mg at 06/11/17 6440  . meropenem (MERREM) 500 mg in sodium chloride 0.9 % 100 mL IVPB  500 mg Intravenous Q24H Erenest Blank, RPH      . multivitamin (RENA-VIT) tablet 1 tablet  1 tablet Oral QHS Ejigiri, Thomos Lemons, PA-C      . ondansetron [facility]) tablet 4 mg  4 mg Oral Q6H PRN Radene Gunning, NP       Or  . ondansetron [facility]) injection 4 mg  4 mg Intravenous Q6H PRN Black, Lezlie Octave, NP      . promethazine (PHENERGAN) tablet 25 mg  25 mg Oral Q6H PRN Black, Karen M, NP      . senna-docusate (Senokot-S) tablet 1 tablet  1 tablet Oral QHS PRN Radene Gunning, NP      . sertraline (ZOLOFT) tablet 50 mg  50 mg Oral QHS Black, Karen M, NP        ROS:   General:  No weight loss, Fever, chills  Gastrointestinal: No hematochezia or melena,  No gastroesophageal reflux, no trouble swallowing  Urinary: [X]  chronic Kidney disease, [X]  on HD - [ ]  MWF or [ ]  TTHS, [ ]  Burning with urination, [ ]  Frequent urination, [ ]  Difficulty urinating;   Skin: No rashes  Psychological: No history of anxiety,  No history of depression   Physical Examination  Vitals:   06/11/17 0545 06/11/17 0600  06/11/17 0615 06/11/17 0819  BP: 134/71 Marland Kitchen)  92/54 112/76 123/62  Pulse: 99 67 69 74  Resp: 18 18 19 18   Temp:    98 F (36.7 C)  TempSrc:    Oral  SpO2: (!) 81% 95% 95% 99%  Weight:    259 lb (117.5 kg)  Height:    5\' 7"  (1.702 m)    Body mass index is 40.57 kg/m.  General:  Alert and oriented, no acute distress HEENT: Normal Abdomen: Soft, non-tender, non-distended, no mass, obese chronic sinus right lower abdomen from prior urostomy  DATA:  CBC    Component Value Date/Time   WBC 9.0 06/10/2017 2100   RBC 3.39 (L) 06/10/2017 2100   HGB 10.1 (L) 06/10/2017 2100   HCT 33.3 (L) 06/10/2017 2100   PLT 197 06/10/2017 2100   MCV 98.2 06/10/2017 2100   MCH 29.8 06/10/2017 2100   MCHC 30.3 06/10/2017 2100   RDW 16.5 (H) 06/10/2017 2100   LYMPHSABS 1.1 12/03/2016 1818   MONOABS 0.5 12/03/2016 1818   EOSABS 0.2 12/03/2016 1818   BASOSABS 0.0 12/03/2016 1818    BMET    Component Value Date/Time   NA 140 06/10/2017 2100   K 4.4 06/10/2017 2100   CL 99 (L) 06/10/2017 2100   CO2 26 06/10/2017 2100   GLUCOSE 100 (H) 06/10/2017 2100   BUN 36 (H) 06/10/2017 2100   CREATININE 8.79 (H) 06/10/2017 2100   CALCIUM 9.9 06/10/2017 2100   GFRNONAA 5 (L) 06/10/2017 2100   GFRAA 5 (L) 06/10/2017 2100    CT abdomen some stranding peri renal IVC  ASSESSMENT:  Some stranding perirenal IVC difficult to know if from recent wire manipulation or ongoing pyelo process.  Regardless clinical significance is probably minimal  Would treat her pyelo.  Does not need further follow up unless decline in clinical condition then would rescan with contrast  Call if questions  Ruta Hinds, MD [facility] Office: [phone] Pager: [phone]

## 2017-06-11 NOTE — Progress Notes (Signed)
Pharmacy Antibiotic Note  Judy Kelley is a 52 y.o. female admitted on 06/10/2017 with UTI/pyelo.  Pharmacy has been consulted for Merrem dosing.Hx ESBL E Coli. ESRD on HD.   Plan: Merrem 500 mg IV q24h Trend WBC, temp F/U urine culture  Temp (24hrs), Avg:98.2 F (36.8 C), Min:98.1 F (36.7 C), Max:98.2 F (36.8 C)  Recent Labs  Lab 06/10/17 2100 06/11/17 0531 06/11/17 0621  WBC 9.0  --   --   CREATININE 8.79*  --   --   LATICACIDVEN  --  0.97 1.21    CrCl cannot be calculated (Unknown ideal weight.).    Allergies  Allergen Reactions  . Ciprofloxacin Other (See Comments)    IV only-burning to her veins, can take PO  . Erythromycin Anaphylaxis  . Chlorhexidine Rash  . Other Rash and Other (See Comments)    chloraprep    Judy Kelley 06/11/2017 8:06 AM

## 2017-06-11 NOTE — ED Notes (Addendum)
Approached by female accompanying patient in waiting area, demanding information, stating "nobody is doing anything about the wait time", using berating tone towards staff and towards other patients in the waiting room who stepped up to ask question as well. She stated to another patient "this has absolutely nothing to do with you, back the f--- off."

## 2017-06-11 NOTE — ED Notes (Signed)
IV nurse x2 at bedside and will try to collect labs.

## 2017-06-11 NOTE — ED Provider Notes (Signed)
[facility] EMERGENCY DEPARTMENT Provider Note   CSN: [id number] Arrival date & time: 06/10/17  2011     History   Chief Complaint Chief Complaint  Patient presents with  . Abdominal Pain    HPI Judy Kelley is a 52 y.o. female.  Patient with ESRD on dialysis, DVT on Eliquis, previous ovarian cancer with urostomy and right nephrostomy presenting with a 1 day history of fever, nausea, abdominal pain and flank pain.  She believes that she has a UTI again.  Her last dialysis session was Thursday.  Her catheter was recently exchanged 4 days ago as it was nonfunctioning.  She has a nephrostomy that has not been changed since April.  She was admitted in April with a similar presentation and found to have ESBL E. coli pyelonephritis.  She states this feels similar with constant right flank pain and abdominal pain with nausea but no vomiting.  Temperature at home was 100.6.  Her right flank pain that radiates to her right abdomen.  No vomiting.  No diarrhea.  Nephrostomy output has been stable.  No chest pain or shortness of breath.  The history is provided by the patient.  Abdominal Pain   Associated symptoms include fever, nausea, vomiting, dysuria and hematuria. Pertinent negatives include headaches, arthralgias and myalgias.    Past Medical History:  Diagnosis Date  . Anemia   . Anxiety   . Cervical cancer (HCC)    Cervical and ovarian  . DVT (deep venous thrombosis) ([place])   . History of kidney stones   . Hypertension    treated years ago, on no medications for at least years ( as of 2019)  . Renal disorder    ESRD - stage 5 Dialysis T/Th/Sa  . Renal insufficiency     Patient Active Problem List   Diagnosis Date Noted  . UTI (urinary tract infection) 04/16/2017  . Pyelonephritis 04/16/2017  . Chronic anticoagulation 04/16/2017  . History of adverse effect of venous thromboembolism (VTE) prophylaxis 04/16/2017  . Depression 04/16/2017  . Infection and  inflammatory reaction due to nephrostomy catheter, initial encounter ([place])   . ESRD (end stage renal disease) ([place]) 03/21/2017  . MRSA bacteremia   . ESRD on dialysis ([place]) 12/04/2016  . Hypokalemia 12/04/2016  . History of ovarian cancer 12/04/2016    Past Surgical History:  Procedure Laterality Date  . AV FISTULA PLACEMENT Right 03/21/2017   Procedure: INSERTION OF ARTERIOVENOUS (AV) GORE-TEX GRAFT RIGHT THIGH;  Surgeon: Angelia Mould, MD;  Location: [place];  Service: Vascular;  Laterality: Right;  . CYSTOSCOPY N/A 12/09/2016   Procedure: ENDOSCOPY OF ILEAL CONDUIT;  Surgeon: Irine Seal, MD;  Location: [place];  Service: Urology;  Laterality: N/A;  . GRAFT APPLICATION Right 38/75/6433   INSERTION OF ARTERIOVENOUS (AV) GORE-TEX GRAFT RIGHT THIGH   . ileal conduit    . IR FLUORO GUIDE CV LINE RIGHT  12/09/2016  . IR FLUORO GUIDE CV LINE RIGHT  06/07/2017  . IR NEPHROSTOMY EXCHANGE RIGHT  01/21/2017  . IR NEPHROSTOMY EXCHANGE RIGHT  04/13/2017  . IR PTA VENOUS EXCEPT DIALYSIS CIRCUIT  06/07/2017  . IR THROMBECTOMY AV FISTULA W/THROMBOLYSIS/PTA/STENT INC/SHUNT/IMG RT Right 12/09/2016  . IR US GUIDE VASC ACCESS RIGHT  12/09/2016  . IR US GUIDE VASC ACCESS RIGHT  12/09/2016  . IR VENOCAVAGRAM IVC  06/07/2017  . nephrosotomy    . TONSILLECTOMY    . TUBAL LIGATION       OB History   None  Home Medications    Prior to Admission medications   Medication Sig Start Date End Date Taking? Authorizing Provider  acetaminophen (TYLENOL) 500 MG tablet Take 1,000 mg by mouth every 6 (six) hours as needed for mild pain or headache.     [provider]  apixaban (ELIQUIS) 5 MG TABS tablet Take 5 mg by mouth 2 (two) times daily.    [provider]  cinacalcet (SENSIPAR) 90 MG tablet Take 90 mg by mouth daily.    [provider]  gabapentin (NEURONTIN) 100 MG capsule Take 100 mg by mouth at bedtime.    [provider]  lanthanum (FOSRENOL) 1000 MG chewable  tablet Chew 1 tablet (1,000 mg total) by mouth 3 (three) times daily with meals. 04/23/17   Patrecia Pour, MD  loperamide (IMODIUM) 2 MG capsule Take 1 capsule (2 mg total) by mouth as needed for diarrhea or loose stools. Patient not taking: Reported on 06/07/2017 04/23/17   Patrecia Pour, MD  multivitamin (RENA-VIT) TABS tablet Take 1 tablet by mouth daily.    [provider]  mupirocin ointment (BACTROBAN) 2 % Apply 1 application topically 2 (two) times daily as needed (rash).     [provider]  promethazine (PHENERGAN) 25 MG tablet Take 25 mg by mouth every 6 (six) hours as needed for nausea or vomiting.    [provider]  sertraline (ZOLOFT) 50 MG tablet Take 50 mg by mouth at bedtime.     [provider]    Family History Family History  Problem Relation Age of Onset  . Diabetes Mellitus II Mother   . Kidney disease Mother   . Heart disease Mother   . Rheum arthritis Mother   . COPD Mother   . Diabetes Mellitus II Father   . Heart disease Father   . Dementia Father   . Ovarian cancer Maternal Aunt     Social History Social History   Tobacco Use  . Smoking status: Never Smoker  . Smokeless tobacco: Never Used  Substance Use Topics  . Alcohol use: Yes    Frequency: Never    Comment: occasional wine  . Drug use: No     Allergies   Ciprofloxacin; Erythromycin; Chlorhexidine; and Other   Review of Systems Review of Systems  Constitutional: Positive for activity change, appetite change, chills and fever.  HENT: Negative for congestion and rhinorrhea.   Eyes: Negative for visual disturbance.  Respiratory: Negative for cough, chest tightness and shortness of breath.   Cardiovascular: Negative for chest pain.  Gastrointestinal: Positive for abdominal pain, nausea and vomiting.  Genitourinary: Positive for dysuria, flank pain, hematuria and urgency. Negative for vaginal bleeding and vaginal discharge.  Musculoskeletal: Positive for back  pain. Negative for arthralgias and myalgias.  Skin: Negative for rash.  Neurological: Negative for dizziness, weakness and headaches.    all other systems are negative except as noted in the HPI and PMH.    Physical Exam Updated Vital Signs BP 114/76 (BP Location: Left Arm)   Pulse 65   Temp 98.2 F (36.8 C) (Oral)   Resp 14   SpO2 96%   Physical Exam  Constitutional: She is oriented to person, place, and time. She appears well-developed and well-nourished. No distress.  Ill appearing  HENT:  Head: Normocephalic and atraumatic.  Mouth/Throat: Oropharynx is clear and moist. No oropharyngeal exudate.  Eyes: Pupils are equal, round, and reactive to light. Conjunctivae and EOM are normal.  Neck: Normal range of  motion. Neck supple.  No meningismus.  Cardiovascular: Normal rate, regular rhythm, normal heart sounds and intact distal pulses.  No murmur heard. dilalysis catheter to R chest appears clean  Pulmonary/Chest: Effort normal and breath sounds normal. No respiratory distress. She exhibits no tenderness.  Abdominal: Soft. There is tenderness. There is no rebound and no guarding.  Mild R sided tenderness  Chronic wound above umbilicus draining clear fluid without cellulitis.  Musculoskeletal: Normal range of motion. She exhibits tenderness. She exhibits no edema.  Nephrostomy in place R CVAT  Neurological: She is alert and oriented to person, place, and time. No cranial nerve deficit. She exhibits normal muscle tone. Coordination normal.  No ataxia on finger to nose bilaterally. No pronator drift. 5/5 strength throughout. CN 2-12 intact.Equal grip strength. Sensation intact.   Skin: Skin is warm. Capillary refill takes less than 2 seconds. No rash noted.  Psychiatric: She has a normal mood and affect. Her behavior is normal.  Nursing note and vitals reviewed.    ED Treatments / Results  Labs (all labs ordered are listed, but only abnormal results are displayed) Labs  Reviewed  COMPREHENSIVE METABOLIC PANEL - Abnormal; Notable for the following components:      Result Value   Chloride 99 (*)    Glucose, Bld 100 (*)    BUN 36 (*)    Creatinine, Ser 8.79 (*)    Albumin 3.3 (*)    AST 14 (*)    ALT <5 (*)    GFR calc non Af Amer 5 (*)    GFR calc Af Amer 5 (*)    All other components within normal limits  CBC - Abnormal; Notable for the following components:   RBC 3.39 (*)    Hemoglobin 10.1 (*)    HCT 33.3 (*)    RDW 16.5 (*)    All other components within normal limits  URINALYSIS, ROUTINE W REFLEX MICROSCOPIC - Abnormal; Notable for the following components:   APPearance CLOUDY (*)    Hgb urine dipstick SMALL (*)    Protein, ur 100 (*)    Leukocytes, UA LARGE (*)    WBC, UA >50 (*)    Bacteria, UA RARE (*)    Non Squamous Epithelial 0-5 (*)    All other components within normal limits  CULTURE, BLOOD (ROUTINE X 2)  CULTURE, BLOOD (ROUTINE X 2)  URINE CULTURE  LIPASE, BLOOD  TROPONIN I  I-STAT CG4 LACTIC ACID, ED  I-STAT CG4 LACTIC ACID, ED    EKG None  Radiology Dg Chest 2 View  Result Date: 06/11/2017 CLINICAL DATA:  Acute onset of right upper quadrant abdominal pain and nausea after dialysis. EXAM: CHEST - 2 VIEW COMPARISON:  Chest radiograph performed 04/17/2017 FINDINGS: The lungs are well-aerated and clear. There is no evidence of focal opacification, pleural effusion or pneumothorax. The heart is normal in size; the mediastinal contour is within normal limits. No acute osseous abnormalities are seen. A right-sided dual-lumen catheter is noted ending about the cavoatrial junction. Vascular stents are noted overlying the right upper arm. Clips are seen at the left axilla. The patient's right-sided nephrostomy tube is partially characterized. IMPRESSION: No acute cardiopulmonary process seen. Electronically Signed   By: Garald Balding M.D.   On: 06/11/2017 05:14   Ct Renal Stone Study  Result Date: 06/11/2017 CLINICAL DATA:  Acute  onset of right upper quadrant abdominal pain and nausea after dialysis. Abdominal belching and flatulence. EXAM: CT ABDOMEN AND PELVIS WITHOUT CONTRAST TECHNIQUE: Multidetector  CT imaging of the abdomen and pelvis was performed following the standard protocol without IV contrast. COMPARISON:  CT of the abdomen and pelvis from 04/16/2017 FINDINGS: Lower chest: The visualized lung bases are grossly clear. The visualized portions of the mediastinum are unremarkable. Hepatobiliary: The liver is unremarkable in appearance. The gallbladder is unremarkable in appearance. The common bile duct remains normal in caliber. Pancreas: The pancreas is within normal limits. Spleen: The spleen is unremarkable in appearance. Adrenals/Urinary Tract: The adrenal glands are unremarkable in appearance. The patient is status post left-sided nephrectomy. There is moderate right renal atrophy, with underlying nephrostomy stent. No hydronephrosis is seen. Mild nonspecific right-sided perinephric stranding is noted. A postoperative seroma is noted at the left renal bed. No renal or ureteral stones are identified. 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 The appendix is not characterized; there is no evidence for appendicitis. The small bowel is grossly unremarkable. The stomach is within normal limits. Vascular/Lymphatic: Scattered calcification is seen along the abdominal aorta and its branches. The abdominal aorta is otherwise grossly unremarkable. Soft tissue inflammation is noted about the infrahepatic IVC, of uncertain significance. No retroperitoneal lymphadenopathy is seen. No pelvic sidewall lymphadenopathy is identified. Reproductive: The patient is status post cystectomy. The uterus is grossly unremarkable. No suspicious adnexal masses are seen. The soft tissue inflammation about the pelvis likely reflects postoperative change.  Other: Diffuse subcutaneous varices are noted along the left anterior abdominal wall. Musculoskeletal: No acute osseous abnormalities are identified. The visualized musculature is unremarkable in appearance. IMPRESSION: 1. Soft tissue inflammation about the infrahepatic IVC, of uncertain significance. Mild infection cannot be excluded. Would correlate clinically to exclude underlying thrombophlebitis. 2. 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 No evidence for obstruction. 3. Moderate right renal atrophy, with underlying nephrostomy stent. 4. Diffuse subcutaneous varices along the left anterior abdominal wall. Aortic Atherosclerosis (ICD10-I70.0). Electronically Signed   By: Garald Balding M.D.   On: 06/11/2017 05:13    Procedures Procedures (including critical care time)  Medications Ordered in ED Medications  morphine 4 MG/ML injection 4 mg (has no administration in time range)  ondansetron (ZOFRAN) injection 4 mg (has no administration in time range)  sodium chloride 0.9 % bolus 500 mL (has no administration in time range)  meropenem (MERREM) 1 g in sodium chloride 0.9 % 100 mL IVPB (has no administration in time range)     Initial Impression / Assessment and Plan / ED Course  I have reviewed the triage vital signs and the nursing notes.  Pertinent labs & imaging results that were available during my care of the patient were reviewed by me and considered in my medical decision making (see chart for details).    Dialysis patient with flank pain and fever similar to previous episodes of pyelonephritis.  Her vitals are stable.  She is in no distress.  Urinalysis is suggestive of infection again.  Blood and urine culture sent.  Will gently hydrate.  Will start on meropenem given previous resistance patterns.  Lactate is normal.  No leukocytosis.  Blood pressure and mental status are stable.  CT scan obtained shows  no complication of nephrostomy but does show stranding along the IVC of uncertain significance.  With fever and recent complicated UTI, will plan admission given her multidrug-resistant bacteria from previous admission. Discussed with Dr. Hal Hope.   Final Clinical Impressions(s) / ED  Diagnoses   Final diagnoses:  Pyelonephritis  ESRD (end stage renal disease) [facility])    ED Discharge Orders    None       Malani Lees, Annie Main, MD 06/11/17 [phone]

## 2017-06-11 NOTE — Consult Note (Addendum)
[facility] Renal Consultation Note    Indication for Consultation:  Management of ESRD/hemodialysis; anemia, hypertension/volume and secondary hyperparathyroidism PCP:  HPI: Judy Kelley is a 52 y.o. 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  PMH also significant for HTN, obesity, hx ileal conduit 2002, Hx cystectomy, hx ovarian ca, hx of pyelonephritis and has chronic R nephrostomy. Her nephrostomy tube is changed q 3 months. Next due in July. Hx DVT on Eliquis.  Patient also has history multiple failed dialysis accesses in upper and lower extremities. She has an appointment with [facility] this month to evaluate for new access.   She is admitted with recurrent pyelonephritis. Presented to ED with right flank pain. CT renal study significant for mild right perinephric stranding.She was admitted to [facility] in April with similar presentation and  treated with 7 day course of meropenem and fosfomycin. Normal CXR. No complaints at bedside. Denies fevers, chills, SOB, N,V,D.   Due for routine dialysis today. Currently dialyzes via [place]. Has had problems with clotting dialyzer during HD and needing frequent cathter exchanges. Most recently catheter exchanged Tuesday in IR. Had good dialysis treatment Thursday.     Past Medical History:  Diagnosis Date  . Anemia   . Anxiety   . Cervical cancer (HCC)    Cervical and ovarian  . DVT (deep venous thrombosis) ([place])   . History of kidney stones   . Hypertension    treated years ago, on no medications for at least years ( as of 2019)  . Renal disorder    ESRD - stage 5 Dialysis T/Th/Sa  . Renal insufficiency    Past Surgical History:  Procedure Laterality Date  . AV FISTULA PLACEMENT Right 03/21/2017   Procedure: INSERTION OF ARTERIOVENOUS (AV) GORE-TEX GRAFT RIGHT THIGH;  Surgeon: Angelia Mould, MD;  Location: [facility];  Service: Vascular;  Laterality: Right;  . CYSTOSCOPY N/A  12/09/2016   Procedure: ENDOSCOPY OF ILEAL CONDUIT;  Surgeon: Irine Seal, MD;  Location: [facility];  Service: Urology;  Laterality: N/A;  . GRAFT APPLICATION Right [id number]/[id number]   INSERTION OF ARTERIOVENOUS (AV) GORE-TEX GRAFT RIGHT THIGH   . ileal conduit    . IR FLUORO GUIDE CV LINE RIGHT  12/09/2016  . IR FLUORO GUIDE CV LINE RIGHT  06/07/2017  . IR NEPHROSTOMY EXCHANGE RIGHT  01/21/2017  . IR NEPHROSTOMY EXCHANGE RIGHT  04/13/2017  . IR PTA VENOUS EXCEPT DIALYSIS CIRCUIT  06/07/2017  . IR THROMBECTOMY AV FISTULA W/THROMBOLYSIS/PTA/STENT INC/SHUNT/IMG RT Right 12/09/2016  . IR US GUIDE VASC ACCESS RIGHT  12/09/2016  . IR US GUIDE VASC ACCESS RIGHT  12/09/2016  . IR VENOCAVAGRAM IVC  06/07/2017  . nephrosotomy    . TONSILLECTOMY    . TUBAL LIGATION     Family History  Problem Relation Age of Onset  . Diabetes Mellitus II Mother   . Kidney disease Mother   . Heart disease Mother   . Rheum arthritis Mother   . COPD Mother   . Diabetes Mellitus II Father   . Heart disease Father   . Dementia Father   . Ovarian cancer Maternal Aunt    Social History:  reports that she has never smoked. She has never used smokeless tobacco. She reports that she drinks alcohol. She reports that she does not use drugs. Allergies  Allergen Reactions  . Ciprofloxacin Other (See Comments)    IV only-burning to her veins, can take PO  . Erythromycin Anaphylaxis  .  Chlorhexidine Rash  . Other Rash and Other (See Comments)    chloraprep   Prior to Admission medications   Medication Sig Start Date End Date Taking? Authorizing Provider  acetaminophen (TYLENOL) 500 MG tablet Take 1,000 mg by mouth every 6 (six) hours as needed for mild pain or headache.    Yes [provider]  apixaban (ELIQUIS) 5 MG TABS tablet Take 5 mg by mouth 2 (two) times daily.   Yes [provider]  cinacalcet (SENSIPAR) 90 MG tablet Take 90 mg by mouth daily.   Yes [provider]  gabapentin (NEURONTIN) 100 MG  capsule Take 100 mg by mouth at bedtime.   Yes [provider]  lanthanum (FOSRENOL) 1000 MG chewable tablet Chew 1 tablet (1,000 mg total) by mouth 3 (three) times daily with meals. 04/23/17  Yes Patrecia Pour, MD  loperamide (IMODIUM) 2 MG capsule Take 1 capsule (2 mg total) by mouth as needed for diarrhea or loose stools. 04/23/17  Yes Patrecia Pour, MD  multivitamin (RENA-VIT) TABS tablet Take 1 tablet by mouth daily.   Yes [provider]  mupirocin ointment (BACTROBAN) 2 % Apply 1 application topically 2 (two) times daily as needed (rash).    Yes [provider]  promethazine (PHENERGAN) 25 MG tablet Take 25 mg by mouth every 6 (six) hours as needed for nausea or vomiting.   Yes [provider]  sertraline (ZOLOFT) 50 MG tablet Take 50 mg by mouth at bedtime.    Yes [provider]   Current Facility-Administered Medications  Medication Dose Route Frequency Provider Last Rate Last Dose  . acetaminophen (TYLENOL) tablet 1,000 mg  1,000 mg Oral Q6H PRN Radene Gunning, NP      . apixaban Arne Cleveland) tablet 5 mg  5 mg Oral BID Radene Gunning, NP   5 mg at 06/11/17 0939  . cinacalcet (SENSIPAR) tablet 90 mg  90 mg Oral Q breakfast Radene Gunning, NP   90 mg at 06/11/17 0940  . gabapentin (NEURONTIN) capsule 100 mg  100 mg Oral QHS Black, Lezlie Octave, NP      . HYDROcodone-acetaminophen (NORCO/VICODIN) 5-325 MG per tablet 1-2 tablet  1-2 tablet Oral Q4H PRN Radene Gunning, NP   2 tablet at 06/11/17 [id number]  . lanthanum (FOSRENOL) chewable tablet 1,000 mg  1,000 mg Oral TID WC Radene Gunning, NP   1,000 mg at 06/11/17 9509  . meropenem (MERREM) 500 mg in sodium chloride 0.9 % 100 mL IVPB  500 mg Intravenous Q24H Erenest Blank, RPH      . ondansetron (ZOFRAN) tablet 4 mg  4 mg Oral Q6H PRN Radene Gunning, NP       Or  . ondansetron [facility]) injection 4 mg  4 mg Intravenous Q6H PRN Black, Lezlie Octave, NP      . promethazine (PHENERGAN) tablet 25 mg  25 mg Oral Q6H  PRN Black, Karen M, NP      . senna-docusate (Senokot-S) tablet 1 tablet  1 tablet Oral QHS PRN Radene Gunning, NP      . sertraline (ZOLOFT) tablet 50 mg  50 mg Oral QHS Black, Karen M, NP        ROS: As per HPI otherwise negative.  Physical Exam: Vitals:   06/11/17 0545 06/11/17 0600 06/11/17 0615 06/11/17 0819  BP: 134/71 (!) 92/54 112/76 123/62  Pulse: 99 67 69 74  Resp: 18 18 19 18   Temp:  98 F (36.7 C)  TempSrc:    Oral  SpO2: (!) 81% 95% 95% 99%  Weight:    117.5 kg (259 lb)  Height:    5\' 7"  (1.702 m)     General: Obese female, sitting up in bed NAD  Head: NCAT sclera not icteric MMM Neck: Supple. No JVD No masses Lungs: CTA bilaterally without wheezes, rales, or rhonchi. Breathing is unlabored. Heart: RRR with S1 S2 Abdomen: soft NT R nephrostomy in place,  Cloudy urine in bag  Lower extremities: Trace LE edema. L>R  Neuro: A & O  X 3. Moves all extremities spontaneously. Psych:  Pleasant. Normal affect  Dialysis Access:R IJ TDC dsg clean intact  Labs: Basic Metabolic Panel: Recent Labs  Lab 06/10/17 2100  NA 140  K 4.4  CL 99*  CO2 26  GLUCOSE 100*  BUN 36*  CREATININE 8.79*  CALCIUM 9.9   Liver Function Tests: Recent Labs  Lab 06/10/17 2100  AST 14*  ALT <5*  ALKPHOS 51  BILITOT 0.4  PROT 7.5  ALBUMIN 3.3*   Recent Labs  Lab 06/10/17 2100  LIPASE 41   No results for input(s): AMMONIA in the last 168 hours. CBC: Recent Labs  Lab 06/10/17 2100  WBC 9.0  HGB 10.1*  HCT 33.3*  MCV 98.2  PLT 197   Cardiac Enzymes: Recent Labs  Lab 06/11/17 0515  TROPONINI <0.03   CBG: No results for input(s): GLUCAP in the last 168 hours. Iron Studies: No results for input(s): IRON, TIBC, TRANSFERRIN, FERRITIN in the last 72 hours. Studies/Results: Dg Chest 2 View  Result Date: 06/11/2017 CLINICAL DATA:  Acute onset of right upper quadrant abdominal pain and nausea after dialysis. EXAM: CHEST - 2 VIEW COMPARISON:  Chest radiograph performed  04/17/2017 FINDINGS: The lungs are well-aerated and clear. There is no evidence of focal opacification, pleural effusion or pneumothorax. The heart is normal in size; the mediastinal contour is within normal limits. No acute osseous abnormalities are seen. A right-sided dual-lumen catheter is noted ending about the cavoatrial junction. Vascular stents are noted overlying the right upper arm. Clips are seen at the left axilla. The patient's right-sided nephrostomy tube is partially characterized. IMPRESSION: No acute cardiopulmonary process seen. Electronically Signed   By: Garald Balding M.D.   On: 06/11/2017 05:14   Ct Renal Stone Study  Result Date: 06/11/2017 CLINICAL DATA:  Acute onset of right upper quadrant abdominal pain and nausea after dialysis. Abdominal belching and flatulence. EXAM: CT ABDOMEN AND PELVIS WITHOUT CONTRAST TECHNIQUE: Multidetector CT imaging of the abdomen and pelvis was performed following the standard protocol without IV contrast. COMPARISON:  CT of the abdomen and pelvis from 04/16/2017 FINDINGS: Lower chest: The visualized lung bases are grossly clear. The visualized portions of the mediastinum are unremarkable. Hepatobiliary: The liver is unremarkable in appearance. The gallbladder is unremarkable in appearance. The common bile duct remains normal in caliber. Pancreas: The pancreas is within normal limits. Spleen: The spleen is unremarkable in appearance. Adrenals/Urinary Tract: The adrenal glands are unremarkable in appearance. The patient is status post left-sided nephrectomy. There is moderate right renal atrophy, with underlying nephrostomy stent. No hydronephrosis is seen. Mild nonspecific right-sided perinephric stranding is noted. A postoperative seroma is noted at the left renal bed. No renal or ureteral stones are identified. 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 The  appendix is not characterized; there is no evidence for  appendicitis. The small bowel is grossly unremarkable. The stomach is within normal limits. Vascular/Lymphatic: Scattered calcification is seen along the abdominal aorta and its branches. The abdominal aorta is otherwise grossly unremarkable. Soft tissue inflammation is noted about the infrahepatic IVC, of uncertain significance. No retroperitoneal lymphadenopathy is seen. No pelvic sidewall lymphadenopathy is identified. Reproductive: The patient is status post cystectomy. The uterus is grossly unremarkable. No suspicious adnexal masses are seen. The soft tissue inflammation about the pelvis likely reflects postoperative change. Other: Diffuse subcutaneous varices are noted along the left anterior abdominal wall. Musculoskeletal: No acute osseous abnormalities are identified. The visualized musculature is unremarkable in appearance. IMPRESSION: 1. Soft tissue inflammation about the infrahepatic IVC, of uncertain significance. Mild infection cannot be excluded. Would correlate clinically to exclude underlying thrombophlebitis. 2. 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 No evidence for obstruction. 3. Moderate right renal atrophy, with underlying nephrostomy stent. 4. Diffuse subcutaneous varices along the left anterior abdominal wall. Aortic Atherosclerosis (ICD10-I70.0). Electronically Signed   By: Garald Balding M.D.   On: 06/11/2017 05:13    Dialysis Orders:  [facility]  4h 180NRe BFR 325/800 EDW 113kg 2K/2Ca  TDC Hep 4000 U Bolus, 500 U q 66min x3  Mircera 156mcg IV q 2 weeks (last 6/6) Calcitriol 0.61mcg PO TIW   Assessment/Plan: 1. R pyelonephritis seen on CT. Chronic R nephrostomy. Treated for E. Coli pyelonephritis in April. Blood/urine cultures pending. IV meropenem per primary.  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  2. ESRD -  TTS. For HD today on schedule.   3. Hypertension/volume  - BP low/stable. No volume excess on exam. UF to EDW as tolerated  4. Anemia  - Hgb 10.1 No ESA needs currently  5. Metabolic bone disease -  Continue Calcitriol/Sensipar/binders  6. Nutrition - Renal vitamins 7. 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   Ogechi Larina Earthly [facility] Pager [phone] 06/11/2017, 12:27 PM   Pt seen, examined and agree w A/P as above.  Kelly Splinter MD [employer] pager [phone]   06/11/2017, 3:16 PM

## 2017-06-11 NOTE — ED Notes (Signed)
Pt difficult stick, unsuccesful attempts to start line and retrieve blood specimens

## 2017-06-12 LAB — RENAL FUNCTION PANEL
Albumin: 2.9 g/dL — ABNORMAL LOW (ref 3.5–5.0)
Anion gap: 14 (ref 5–15)
BUN: 49 mg/dL — AB (ref 6–20)
CHLORIDE: 101 mmol/L (ref 101–111)
CO2: 25 mmol/L (ref 22–32)
CREATININE: 10.8 mg/dL — AB (ref 0.44–1.00)
Calcium: 8.8 mg/dL — ABNORMAL LOW (ref 8.9–10.3)
GFR calc non Af Amer: 4 mL/min — ABNORMAL LOW (ref >60)
GFR, EST AFRICAN AMERICAN: 4 mL/min — AB (ref >60)
Glucose, Bld: 91 mg/dL (ref 65–99)
Phosphorus: 7.5 mg/dL — ABNORMAL HIGH (ref 2.5–4.6)
Potassium: 4.6 mmol/L (ref 3.5–5.1)
SODIUM: 140 mmol/L (ref 135–145)

## 2017-06-12 LAB — CBC
HCT: 27.6 % — ABNORMAL LOW (ref 36.0–46.0)
Hemoglobin: 8.2 g/dL — ABNORMAL LOW (ref 12.0–15.0)
MCH: 29.2 pg (ref 26.0–34.0)
MCHC: 29.7 g/dL — ABNORMAL LOW (ref 30.0–36.0)
MCV: 98.2 fL (ref 78.0–100.0)
Platelets: 217 K/uL (ref 150–400)
RBC: 2.81 MIL/uL — ABNORMAL LOW (ref 3.87–5.11)
RDW: 16.6 % — ABNORMAL HIGH (ref 11.5–15.5)
WBC: 6.4 K/uL (ref 4.0–10.5)

## 2017-06-12 LAB — GLUCOSE, CAPILLARY: Glucose-Capillary: 94 mg/dL (ref 65–99)

## 2017-06-12 MED ORDER — PENTAFLUOROPROP-TETRAFLUOROETH EX AERO: 1.0000 "application " | INHALATION_SPRAY | CUTANEOUS | Status: DC | PRN

## 2017-06-12 MED ORDER — SODIUM CHLORIDE 0.9 % IV BOLUS
250.0000 mL | Freq: Once | INTRAVENOUS | Status: AC
  Administered 2017-06-12: 250 mL via INTRAVENOUS

## 2017-06-12 MED ORDER — SODIUM CHLORIDE 0.9 % IV SOLN: 100.0000 mL | INTRAVENOUS | Status: DC | PRN

## 2017-06-12 MED ORDER — HEPARIN SODIUM (PORCINE) 1000 UNIT/ML DIALYSIS
1000.0000 [IU] | INTRAMUSCULAR | Status: DC | PRN
  Administered 2017-06-12: 1000 [IU] via INTRAVENOUS_CENTRAL

## 2017-06-12 MED ORDER — LIDOCAINE-PRILOCAINE 2.5-2.5 % EX CREA: 1.0000 "application " | TOPICAL_CREAM | CUTANEOUS | Status: DC | PRN

## 2017-06-12 MED ORDER — ALTEPLASE 2 MG IJ SOLR: 2.0000 mg | Freq: Once | INTRAMUSCULAR | Status: DC | PRN

## 2017-06-12 MED ORDER — HEPARIN SODIUM (PORCINE) 1000 UNIT/ML DIALYSIS
4000.0000 [IU] | Freq: Once | INTRAMUSCULAR | Status: AC
  Administered 2017-06-12: 4000 [IU] via INTRAVENOUS_CENTRAL

## 2017-06-12 MED ORDER — HEPARIN SODIUM (PORCINE) 1000 UNIT/ML DIALYSIS
500.0000 [IU] | INTRAMUSCULAR | Status: AC | PRN
  Administered 2017-06-12 (×3): 500 [IU] via INTRAVENOUS_CENTRAL
  Filled 2017-06-12 (×4): qty 1

## 2017-06-12 MED ORDER — ONDANSETRON HCL 4 MG/2ML IJ SOLN
INTRAMUSCULAR | Status: AC
  Administered 2017-06-12: 4 mg via INTRAVENOUS
  Filled 2017-06-12: qty 2

## 2017-06-12 MED ORDER — LIDOCAINE HCL (PF) 1 % IJ SOLN: 5.0000 mL | INTRAMUSCULAR | Status: DC | PRN

## 2017-06-12 NOTE — Progress Notes (Signed)
HD tx completed w/ cath function problems throughout tx, had to end up reversing but this is a newly placed cath on 6/4 and shouldn't be alarming w/ increased AP continuously. Made MD aware and asked if  a cxray to check for cath placement to see if it needs to be pulled back some would be appropriate for the pt. UF goal met, blood rinsed back, VSS, report called to Alva Garnet, RN

## 2017-06-12 NOTE — Progress Notes (Signed)
HD tx initiated via HD cath w/o problem, pull/push/flush equally w/o problem, VSS w/ soft bp, will cont to monitor while on HD tx 

## 2017-06-12 NOTE — Progress Notes (Signed)
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 Authorizing Provider  acetaminophen (TYLENOL) 500 MG tablet Take 1,000 mg by mouth every 6 (six) hours as needed for mild pain or headache.    Yes [provider]  apixaban (ELIQUIS) 5 MG TABS tablet Take 5 mg by mouth 2 (two) times daily.   Yes [provider]  cinacalcet (SENSIPAR) 90 MG tablet Take 90 mg by mouth daily.   Yes [provider]  gabapentin (NEURONTIN) 100 MG capsule Take 100 mg by mouth at bedtime.   Yes [provider]  lanthanum (FOSRENOL) 1000 MG chewable tablet Chew 1 tablet (1,000 mg total) by mouth 3 (three) times daily with meals. 04/23/17  Yes Patrecia Pour, MD  loperamide (IMODIUM) 2 MG capsule Take 1 capsule (2 mg total) by mouth as needed for diarrhea or loose stools. 04/23/17  Yes Patrecia Pour, MD  multivitamin (RENA-VIT) TABS tablet Take 1 tablet by mouth daily.   Yes [provider]  mupirocin ointment (BACTROBAN) 2 % Apply 1 application topically 2 (two) times daily as needed (rash).    Yes [provider]  promethazine (PHENERGAN) 25 MG tablet Take 25 mg by mouth every 6 (six) hours as needed for nausea or vomiting.   Yes [provider]  sertraline (ZOLOFT) 50 MG tablet Take 50 mg by mouth at bedtime.    Yes [provider]     Vital Signs: BP (!) 78/65   Pulse (!) 54   Temp 98.1 F (36.7 C) (Oral)    Resp 16   Ht 5\' 7"  (1.702 m)   Wt 250 lb 10.6 oz (113.7 kg)   SpO2 97%   BMI 39.26 kg/m   Physical Exam  Abdominal: Normal appearance.  Skin: Skin is warm and dry.  Site of R PCN is clean and dry NT no bleeding No sutures or Stat lock (pt prefers neither-- has had PCN many yrs and does fine without either) No redness No leakage No swelling OP yellow urine-- amount is good  Vitals reviewed.   Imaging: Dg Chest 2 View  Result Date: 06/11/2017 CLINICAL DATA:  Acute onset of right upper quadrant abdominal pain and nausea after dialysis. EXAM: CHEST - 2 VIEW COMPARISON:  Chest radiograph performed 04/17/2017 FINDINGS: The lungs are well-aerated and clear. There is no evidence of focal opacification, pleural effusion or pneumothorax. The heart is normal in size; the mediastinal contour is within normal limits. No acute osseous abnormalities are seen. A right-sided dual-lumen catheter is noted ending about the cavoatrial junction. Vascular stents are noted overlying the right upper arm. Clips are seen at the left axilla. The patient's right-sided nephrostomy tube is partially characterized. IMPRESSION: No acute cardiopulmonary process seen. Electronically Signed   By: Garald Balding M.D.   On: 06/11/2017 05:14   Ct Renal Stone Study  Result Date: 06/11/2017 CLINICAL DATA:  Acute onset of right upper quadrant abdominal pain and nausea after dialysis. Abdominal belching and flatulence. EXAM: CT  ABDOMEN AND PELVIS WITHOUT CONTRAST TECHNIQUE: Multidetector CT imaging of the abdomen and pelvis was performed following the standard protocol without IV contrast. COMPARISON:  CT of the abdomen and pelvis from 04/16/2017 FINDINGS: Lower chest: The visualized lung bases are grossly clear. The visualized portions of the mediastinum are unremarkable. Hepatobiliary: The liver is unremarkable in appearance. The gallbladder is unremarkable in appearance. The common bile duct remains normal in caliber. Pancreas:  The pancreas is within normal limits. Spleen: The spleen is unremarkable in appearance. Adrenals/Urinary Tract: The adrenal glands are unremarkable in appearance. The patient is status post left-sided nephrectomy. There is moderate right renal atrophy, with underlying nephrostomy stent. No hydronephrosis is seen. Mild nonspecific right-sided perinephric stranding is noted. A postoperative seroma is noted at the left renal bed. No renal or ureteral stones are identified. 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 The appendix is not characterized; there is no evidence for appendicitis. The small bowel is grossly unremarkable. The stomach is within normal limits. Vascular/Lymphatic: Scattered calcification is seen along the abdominal aorta and its branches. The abdominal aorta is otherwise grossly unremarkable. Soft tissue inflammation is noted about the infrahepatic IVC, of uncertain significance. No retroperitoneal lymphadenopathy is seen. No pelvic sidewall lymphadenopathy is identified. Reproductive: The patient is status post cystectomy. The uterus is grossly unremarkable. No suspicious adnexal masses are seen. The soft tissue inflammation about the pelvis likely reflects postoperative change. Other: Diffuse subcutaneous varices are noted along the left anterior abdominal wall. Musculoskeletal: No acute osseous abnormalities are identified. The visualized musculature is unremarkable in appearance. IMPRESSION: 1. Soft tissue inflammation about the infrahepatic IVC, of uncertain significance. Mild infection cannot be excluded. Would correlate clinically to exclude underlying thrombophlebitis. 2. 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 No evidence for obstruction. 3. Moderate right renal atrophy, with underlying nephrostomy stent. 4.  Diffuse subcutaneous varices along the left anterior abdominal wall. Aortic Atherosclerosis (ICD10-I70.0). Electronically Signed   By: Garald Balding M.D.   On: 06/11/2017 05:13    Labs:  CBC: Recent Labs    04/22/17 0606 04/23/17 0755 06/10/17 2100 06/12/17 0259  WBC 6.2 6.8 9.0 6.4  HGB 10.1* 10.2* 10.1* 8.2*  HCT 33.3* 33.6* 33.3* 27.6*  PLT 275 315 197 217    COAGS: Recent Labs    12/03/16 1818  12/08/16 0734 03/21/17 0642 03/21/17 1630 04/16/17 2014 04/17/17 0648 04/17/17 1705  INR 1.14  --   --  1.05 0.97  --   --   --   APTT  --    < > 77*  --   --  41* 49* 83*   < > = values in this interval not displayed.    BMP: Recent Labs    04/21/17 0730 04/23/17 0755 06/10/17 2100 06/12/17 0300  NA 140 139 140 140  K 3.8 4.1 4.4 4.6  CL 105 104 99* 101  CO2 24 23 26 25   GLUCOSE 94 84 100* 91  BUN 22* 31* 36* 49*  CALCIUM 8.6* 9.2 9.9 8.8*  CREATININE 7.80* 7.55* 8.79* 10.80*  GFRNONAA 5* 6* 5* 4*  GFRAA 6* 6* 5* 4*    LIVER FUNCTION TESTS: Recent Labs    12/03/16 1818 03/21/17 1630 04/16/17 0442  04/21/17 0730 04/23/17 0755 06/10/17 2100 06/12/17 0300  BILITOT 0.5 0.8 0.8  --   --   --  0.4  --  AST 41 21 17  --   --   --  14*  --   ALT 21 8* 12*  --   --   --  <5*  --   ALKPHOS 137* 40 55  --   --   --  51  --   PROT 8.1 6.6 7.7  --   --   --  7.5  --   ALBUMIN 3.1* 3.6 3.5   < > 2.8* 3.0* 3.3* 2.9*   < > = values in this interval not displayed.    Assessment and Plan:  Rt PCN intact No signs of infection UOP great- yellow urine Redressed Scheduled for routine change 07/06/2017 Will report to Radiologist-- determine if need for exchange earlier  Electronically Signed: Maylee Bare A, PA-C 06/12/2017, 2:17 PM   I spent a total of 25 Minutes at the the patient's bedside AND on the patient's hospital floor or unit, greater than 50% of which was counseling/coordinating care for R PCN eval

## 2017-06-12 NOTE — Progress Notes (Addendum)
[facility] Progress Note   Subjective:  Seen in room. No complaints this morning HD early this morning. Net UF 4L. Some cath function issues noted by RN   Objective Vitals:   06/12/17 0600 06/12/17 0625 06/12/17 0632 06/12/17 0646  BP: (!) 102/48 (!) 97/42 102/60 99/63  Pulse: 70 74 63 64  Resp: 20 19 13 16   Temp:   (!) 97.4 F (36.3 C) 98.5 F (36.9 C)  TempSrc:   Oral Oral  SpO2: 95% 95% 95% 95%  Weight:   113.7 kg (250 lb 10.6 oz)   Height:       Physical Exam General: Obese female, sitting up in bed NAD  Lungs: CTA bilaterally without wheezes, rales, or rhonchi.  Heart: RRR with S1 S2 Abdomen: soft NT R nephrostomy in place,  Cloudy urine in bag  Lower extremities: Trace LE edema. L>R  Psych:  Pleasant. Normal affect  Dialysis Access:R IJ TDC dsg clean    Dialysis Orders:  [facility]  4h 180NRe BFR 325/800 EDW 113kg 2K/2Ca  TDC Heparin 4000 U Bolus, 500 U q 55min x3  -Mircera 13mcg IV q 2 weeks (last 6/6) -Calcitriol 0.67mcg PO TIW   Assessment/Plan: 1. R pyelonephritis seen on CT. Chronic R nephrostomy. 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 Treated for pyelonephritis in April. Blood/urine cultures pending. IV meropenem per primary.   2. ESRD -  TTS. Continue on schedule. Next HD 6/11 Get CXR at request of HD RN thinks might be kinked 3. Hypertension/volume  - BP low/stable. No volume excess on exam. Post HD wt 113.7kg.  4. Anemia  - Hgb 10.1 No ESA needs currently  5. Metabolic bone disease -  Continue Calcitriol/Sensipar/binders  6. Nutrition - Renal vitamins 7. 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     Ogechi Larina Earthly PA-C [place] Pager [phone] 06/12/2017,10:40 AM  LOS: 1 day    Pt seen, examined, agree w assess/plan as above with additions as indicated.  Kelly Splinter MD [facility] pager [phone]    cell [phone] 06/12/2017, 12:50 PM      Additional Objective Labs: Basic Metabolic Panel: Recent  Labs  Lab 06/10/17 2100 06/12/17 0300  NA 140 140  K 4.4 4.6  CL 99* 101  CO2 26 25  GLUCOSE 100* 91  BUN 36* 49*  CREATININE 8.79* 10.80*  CALCIUM 9.9 8.8*  PHOS  --  7.5*   CBC: Recent Labs  Lab 06/10/17 2100 06/12/17 0259  WBC 9.0 6.4  HGB 10.1* 8.2*  HCT 33.3* 27.6*  MCV 98.2 98.2  PLT 197 217   Blood Culture    Component Value Date/Time   SDES BLOOD RIGHT HAND 04/16/2017 0531   SPECREQUEST  04/16/2017 0531    BOTTLES DRAWN AEROBIC AND ANAEROBIC Blood Culture adequate volume   CULT  04/16/2017 0531    NO GROWTH 5 DAYS Performed at [facility], [place] [address], [place], [place] [address]    REPTSTATUS 04/21/2017 FINAL 04/16/2017 0531    Cardiac Enzymes: Recent Labs  Lab 06/11/17 0515  TROPONINI <0.03   CBG: Recent Labs  Lab 06/12/17 0648  GLUCAP 94   Iron Studies: No results for input(s): IRON, TIBC, TRANSFERRIN, FERRITIN in the last 72 hours. Lab Results  Component Value Date   INR 0.97 03/21/2017   INR 1.05 03/21/2017   INR 1.14 12/03/2016   Medications: . sodium chloride    . sodium chloride    . meropenem (MERREM) IV Stopped (  06/11/17 2240)   . apixaban  5 mg Oral BID  . calcitRIOL  0.75 mcg Oral Q T,Th,Sa-HD  . cinacalcet  90 mg Oral Q breakfast  . gabapentin  100 mg Oral QHS  . lanthanum  1,000 mg Oral TID WC  . multivitamin  1 tablet Oral QHS  . sertraline  50 mg Oral QHS

## 2017-06-12 NOTE — Progress Notes (Signed)
@IPLOG @        PROGRESS NOTE                                                                                                                                                                                                             Patient Demographics:    Judy Kelley, is a 52 y.o. female, DOB - [birth year], CXK:[id number]  Admit date - 06/10/2017   Admitting Physician Florencia Reasons, MD  Outpatient Primary MD for the patient is System, Pcp Not In  LOS - 1  Chief Complaint  Patient presents with  . Abdominal Pain       Brief Narrative Judy Kelley is a very pleasant 52 y.o. 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 Initial evaluation concerning for pyelonephritis. [facility] hospitalists are asked to admit.  Information is obtained from the patient and the chart. She reports her hemodialysis catheter was exchanged 4 days ago. She also has a nephrostomy tube has not been changed since April, she was admitted for right-sided pyelonephritis treatment.     Subjective:    Judy Kelley today has, No headache, No chest pain, No abdominal pain - No Nausea, No new weakness tingling or numbness, No Cough - SOB. R sided flank pain.   Assessment  & Plan :     1.  Right-sided pyelonephritis in the setting of chronic indwelling right-sided nephrostomy tube.  3 of ESBL E. coli infection. She is currently on meropenem  and clinically seems to be improving, IR has been requested to evaluate the nephrostomy tube site.  2.  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  Seen by vascular surgery no further recommendation.  3.  History of DVT, IVC clot.  On Eliquis.  4.  3 of ovarian cancer.  Under remission no acute issues.  Outpatient follow-up with PCP and primary oncologist as needed.  5.  ESRD.  On TTS schedule, renal on board.  Last dialysis 06/11/2017.  6.  Anemia of chronic disease.  No acute issues will monitor.    Diet :  Diet Order           Diet heart healthy/carb modified Room service appropriate? Yes; Fluid consistency: Thin  Diet effective now            Family Communication  :  None  Code Status :  Full  Disposition Plan  :  Home once pyelonephritis gets better  Consults  : IR, vascular surgery, renal  Procedures  :      DVT Prophylaxis  : Eliquis  Lab Results  Component Value Date   PLT 217 06/12/2017    Inpatient Medications  Scheduled Meds: . apixaban  5 mg Oral BID  . calcitRIOL  0.75 mcg Oral Q T,Th,Sa-HD  . cinacalcet  90 mg Oral Q breakfast  . gabapentin  100 mg Oral QHS  . lanthanum  1,000 mg Oral TID WC  . multivitamin  1 tablet Oral QHS  . sertraline  50 mg Oral QHS   Continuous Infusions: . sodium chloride    . sodium chloride    . meropenem (MERREM) IV Stopped (06/11/17 2240)   PRN Meds:.sodium chloride, sodium chloride, acetaminophen, alteplase, heparin, HYDROcodone-acetaminophen, lidocaine (PF), lidocaine-prilocaine, ondansetron **OR** ondansetron (ZOFRAN) IV, pentafluoroprop-tetrafluoroeth, promethazine, senna-docusate  Antibiotics  :    Anti-infectives (From admission, onward)   Start     Dose/Rate Route Frequency Ordered Stop   06/11/17 2200  meropenem (MERREM) 500 mg in sodium chloride 0.9 % 100 mL IVPB     500 mg 200 mL/hr over 30 Minutes Intravenous  Every 24 hours 06/11/17 0809     06/11/17 0500  meropenem (MERREM) 1 g in sodium chloride 0.9 % 100 mL IVPB     1 g 200 mL/hr over 30 Minutes Intravenous  Once 06/11/17 0423 06/11/17 0740         Objective:   Vitals:   06/12/17 0600 06/12/17 0625 06/12/17 0632 06/12/17 0646  BP: (!) 102/48 (!) 97/42 102/60 99/63  Pulse: 70 74 63 64  Resp: 20 19 13 16   Temp:   (!) 97.4 F (36.3 C) 98.5 F (36.9 C)  TempSrc:   Oral Oral  SpO2: 95% 95% 95% 95%  Weight:   113.7 kg (250 lb 10.6 oz)   Height:        Wt Readings from Last 3 Encounters:  06/12/17 113.7 kg (250 lb 10.6 oz)  04/23/17 112.2 kg (247 lb 5.7 oz)  04/06/17 113.9 kg (251 lb)     Intake/Output Summary (Last 24 hours) at 06/12/2017 1336 Last data filed at 06/12/2017 2706 Gross per 24 hour  Intake 337 ml  Output 4200 ml  Net -3863 ml     Physical Exam  Awake Alert, Oriented X 3, No new F.N deficits, Normal affect [place].AT,PERRAL Supple Neck,No JVD, No cervical lymphadenopathy appriciated.  Symmetrical Chest wall movement, Good air movement bilaterally, CTAB RRR,No Gallops,Rubs or new Murmurs, No Parasternal Heave +ve B.Sounds, Abd Soft, No tenderness, No organomegaly appriciated, No rebound - guarding or rigidity. No Cyanosis, Clubbing or edema, No new Rash or bruise, right-sided nephrostomy tube in place with mild surrounding tenderness.    Data Review:    CBC Recent Labs  Lab 06/10/17 2100 06/12/17 0259  WBC 9.0 6.4  HGB 10.1* 8.2*  HCT 33.3* 27.6*  PLT 197 217  MCV 98.2 98.2  MCH 29.8 29.2  MCHC 30.3 29.7*  RDW 16.5* 16.6*    Chemistries  Recent Labs  Lab 06/10/17 2100 06/12/17 0300  NA 140 140  K 4.4 4.6  CL 99* 101  CO2 26 25  GLUCOSE 100* 91  BUN 36* 49*  CREATININE 8.79* 10.80*  CALCIUM 9.9 8.8*  AST 14*  --   ALT <5*  --   ALKPHOS 51  --   BILITOT 0.4  --    ------------------------------------------------------------------------------------------------------------------ No  results for input(s): CHOL, HDL, LDLCALC, TRIG, CHOLHDL, LDLDIRECT in the last 72 hours.  No results found for: HGBA1C ------------------------------------------------------------------------------------------------------------------ No results for input(s): TSH, T4TOTAL, T3FREE, THYROIDAB in the last 72 hours.  Invalid input(s): FREET3 ------------------------------------------------------------------------------------------------------------------ No results for input(s): VITAMINB12, FOLATE, FERRITIN, TIBC, IRON, RETICCTPCT in the last 72 hours.  Coagulation profile No results for input(s): INR, PROTIME in the last 168 hours.  No results for input(s): DDIMER in the last 72 hours.  Cardiac Enzymes Recent Labs  Lab 06/11/17 0515  TROPONINI <0.03   ------------------------------------------------------------------------------------------------------------------ No results found for: BNP  Micro Results No results found for this or any previous visit (from the past 240 hour(s)).  Radiology Reports Dg Chest 2 View  Result Date: 06/11/2017 CLINICAL DATA:  Acute onset of right upper quadrant abdominal pain and nausea after dialysis. EXAM: CHEST - 2 VIEW COMPARISON:  Chest radiograph performed 04/17/2017 FINDINGS: The lungs are well-aerated and clear. There is no evidence of focal opacification, pleural effusion or pneumothorax. The heart is normal in size; the mediastinal contour is within normal limits. No acute osseous abnormalities are seen. A right-sided dual-lumen catheter is noted ending about the cavoatrial junction. Vascular stents are noted overlying the right upper arm. Clips are seen at the left axilla. The patient's right-sided nephrostomy tube is partially characterized. IMPRESSION: No acute cardiopulmonary process seen. Electronically Signed   By: Garald Balding M.D.   On: 06/11/2017 05:14   Ir Mariana Arn Ivc  Result Date: 06/07/2017 INDICATION: End-stage renal disease, poor  functioning right IJ dialysis catheter EXAM: FLUOROSCOPIC EXCHANGE AND REPOSITION OF THE RIGHT IJ TUNNELED DIALYSIS CATHETER SVC AND IVC VENOGRAMS SVC 12 MM VENOUS ANGIOPLASTY FOR CHRONIC CATHETER RELATED FIBRIN SHEATH MEDICATIONS: 1% LIDOCAINE LOCAL ANESTHESIA/SEDATION: Moderate Sedation Time: NONE. Minutes. The patient's level of consciousness and vital signs were monitored continuously by radiology nursing throughout the procedure under my direct supervision. FLUOROSCOPY TIME:  Fluoroscopy Time: 6 minutes 30 seconds (227 mGy). COMPLICATIONS: None immediate. PROCEDURE: Informed written consent was obtained from the patient after a thorough discussion of the procedural risks, benefits and alternatives. All questions were addressed. Maximal Sterile Barrier Technique was utilized including caps, mask, sterile gowns, sterile gloves, sterile drape, hand hygiene and skin antiseptic. A timeout was performed prior to the initiation of the procedure. Under sterile conditions and local anesthesia, the existing tunneled right IJ dialysis catheter terminating in the IVC was removed over an Amplatz guidewire. Ten French sheath inserted to the level of the innominate vein. SVC venogram performed over the guidewire through this sheath. SVC: Chronic catheter related fibrin sheath within the SVC extending into the right atrium. Through this sheath, a Kumpe catheter was advanced into the IVC. Guidewire would not advance distally into the IVC. IVC venogram performed. IVC: Occlusive conical filling defect within the IVC noted at the level of the suprarenal IVC compatible with IVC thrombosis. Amplatz guidewire was reinserted. SVC venous angioplasty: Over the Amplatz guidewire, overlapping 12 mm angioplasty performed of the SVC with a 12 x 4 atlas balloon. Three overlapping inflations were performed for disruption of the chronic SVC fibrin sheath. Balloon  was deflated and removed. SVC venogram repeated. This confirms disruption of the  fibrin sheath with preserved patency of the SVC. No significant SVC stenosis. Catheter placement: Over the Amplatz guidewire, a new 19 cm tip to cuff palindrome catheter was advanced with the tip position in the proximal right atrium. Contrast injection confirms excellent flow through the catheter. Blood aspirated easily followed by saline and heparin flushes. Appropriate volume and strength of heparin instilled in both lumens. Catheter secured with a Prolene suture to the skin site. Sterile dressing applied. No immediate complication. Patient tolerated the procedure well. IMPRESSION: SVC venogram confirms chronic catheter related SVC fibrin sheath which was successfully disrupted with 12 mm overlapping venous angioplasty to preserved SVC patency. Guidewire would not advance distally in the IVC. 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 Successful fluoroscopic exchange and reposition of the right IJ dialysis catheter. Tip positioned proximal right atrium. Access ready for use. Electronically Signed   By: Jerilynn Mages.  Shick M.D.   On: 06/07/2017 11:09   Ir Fluoro Guide Cv Line Right  Result Date: 06/07/2017 INDICATION: End-stage renal disease, poor functioning right IJ dialysis catheter EXAM: FLUOROSCOPIC EXCHANGE AND REPOSITION OF THE RIGHT IJ TUNNELED DIALYSIS CATHETER SVC AND IVC VENOGRAMS SVC 12 MM VENOUS ANGIOPLASTY FOR CHRONIC CATHETER RELATED FIBRIN SHEATH MEDICATIONS: 1% LIDOCAINE LOCAL ANESTHESIA/SEDATION: Moderate Sedation Time: NONE. Minutes. The patient's level of consciousness and vital signs were monitored continuously by radiology nursing throughout the procedure under my direct supervision. FLUOROSCOPY TIME:  Fluoroscopy Time: 6 minutes 30 seconds (227 mGy). COMPLICATIONS: None immediate. PROCEDURE: Informed written consent was obtained from the patient after a thorough discussion of the procedural risks, benefits and alternatives. All questions were addressed. Maximal  Sterile Barrier Technique was utilized including caps, mask, sterile gowns, sterile gloves, sterile drape, hand hygiene and skin antiseptic. A timeout was performed prior to the initiation of the procedure. Under sterile conditions and local anesthesia, the existing tunneled right IJ dialysis catheter terminating in the IVC was removed over an Amplatz guidewire. Ten French sheath inserted to the level of the innominate vein. SVC venogram performed over the guidewire through this sheath. SVC: Chronic catheter related fibrin sheath within the SVC extending into the right atrium. Through this sheath, a Kumpe catheter was advanced into the IVC. Guidewire would not advance distally into the IVC. IVC venogram performed. IVC: Occlusive conical filling defect within the IVC noted at the level of the suprarenal IVC compatible with IVC thrombosis. Amplatz guidewire was reinserted. SVC venous angioplasty: Over the Amplatz guidewire, overlapping 12 mm angioplasty performed of the SVC with a 12 x 4 atlas balloon. Three overlapping inflations were performed for disruption of the chronic SVC fibrin sheath. Balloon was deflated and removed. SVC venogram repeated. This confirms disruption of the fibrin sheath with preserved patency of the SVC. No significant SVC stenosis. Catheter placement: Over the Amplatz guidewire, a new 19 cm tip to cuff palindrome catheter was advanced with the tip position in the proximal right atrium. Contrast injection confirms excellent flow through the catheter. Blood aspirated easily followed by saline and heparin flushes. Appropriate volume and strength of heparin instilled in both lumens. Catheter secured with a Prolene suture to the skin site. Sterile dressing applied. No immediate complication. Patient tolerated the procedure well. IMPRESSION: SVC venogram confirms chronic catheter related SVC fibrin sheath which was successfully disrupted with 12 mm overlapping venous angioplasty to preserved SVC  patency. Guidewire would not advance distally in the IVC. IVC  venogram confirms thrombosis of the IVC at the level of the suprarenal cava. Successful fluoroscopic exchange and reposition of the right IJ dialysis catheter. Tip positioned proximal right atrium. Access ready for use. Electronically Signed   By: Jerilynn Mages.  Shick M.D.   On: 06/07/2017 11:09   Ir Pta Venous Except Dialysis Circuit  Result Date: 06/07/2017 INDICATION: End-stage renal disease, poor functioning right IJ dialysis catheter EXAM: FLUOROSCOPIC EXCHANGE AND REPOSITION OF THE RIGHT IJ TUNNELED DIALYSIS CATHETER SVC AND IVC VENOGRAMS SVC 12 MM VENOUS ANGIOPLASTY FOR CHRONIC CATHETER RELATED FIBRIN SHEATH MEDICATIONS: 1% LIDOCAINE LOCAL ANESTHESIA/SEDATION: Moderate Sedation Time: NONE. Minutes. The patient's level of consciousness and vital signs were monitored continuously by radiology nursing throughout the procedure under my direct supervision. FLUOROSCOPY TIME:  Fluoroscopy Time: 6 minutes 30 seconds (227 mGy). COMPLICATIONS: None immediate. PROCEDURE: Informed written consent was obtained from the patient after a thorough discussion of the procedural risks, benefits and alternatives. All questions were addressed. Maximal Sterile Barrier Technique was utilized including caps, mask, sterile gowns, sterile gloves, sterile drape, hand hygiene and skin antiseptic. A timeout was performed prior to the initiation of the procedure. Under sterile conditions and local anesthesia, the existing tunneled right IJ dialysis catheter terminating in the IVC was removed over an Amplatz guidewire. Ten French sheath inserted to the level of the innominate vein. SVC venogram performed over the guidewire through this sheath. SVC: Chronic catheter related fibrin sheath within the SVC extending into the right atrium. Through this sheath, a Kumpe catheter was advanced into the IVC. Guidewire would not advance distally into the IVC. IVC venogram performed. IVC: Occlusive  conical filling defect within the IVC noted at the level of the suprarenal IVC compatible with IVC thrombosis. Amplatz guidewire was reinserted. SVC venous angioplasty: Over the Amplatz guidewire, overlapping 12 mm angioplasty performed of the SVC with a 12 x 4 atlas balloon. Three overlapping inflations were performed for disruption of the chronic SVC fibrin sheath. Balloon was deflated and removed. SVC venogram repeated. This confirms disruption of the fibrin sheath with preserved patency of the SVC. No significant SVC stenosis. Catheter placement: Over the Amplatz guidewire, a new 19 cm tip to cuff palindrome catheter was advanced with the tip position in the proximal right atrium. Contrast injection confirms excellent flow through the catheter. Blood aspirated easily followed by saline and heparin flushes. Appropriate volume and strength of heparin instilled in both lumens. Catheter secured with a Prolene suture to the skin site. Sterile dressing applied. No immediate complication. Patient tolerated the procedure well. IMPRESSION: SVC venogram confirms chronic catheter related SVC fibrin sheath which was successfully disrupted with 12 mm overlapping venous angioplasty to preserved SVC patency. Guidewire would not advance distally in the IVC. 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 Successful fluoroscopic exchange and reposition of the right IJ dialysis catheter. Tip positioned proximal right atrium. Access ready for use. Electronically Signed   By: Jerilynn Mages.  Shick M.D.   On: 06/07/2017 11:09   Ct Renal Stone Study  Result Date: 06/11/2017 CLINICAL DATA:  Acute onset of right upper quadrant abdominal pain and nausea after dialysis. Abdominal belching and flatulence. EXAM: CT ABDOMEN AND PELVIS WITHOUT CONTRAST TECHNIQUE: Multidetector CT imaging of the abdomen and pelvis was performed following the standard protocol without IV contrast. COMPARISON:  CT of the abdomen and  pelvis from 04/16/2017 FINDINGS: Lower chest: The visualized lung bases are grossly clear. The visualized portions of the mediastinum are unremarkable. Hepatobiliary: The liver  is unremarkable in appearance. The gallbladder is unremarkable in appearance. The common bile duct remains normal in caliber. Pancreas: The pancreas is within normal limits. Spleen: The spleen is unremarkable in appearance. Adrenals/Urinary Tract: The adrenal glands are unremarkable in appearance. The patient is status post left-sided nephrectomy. There is moderate right renal atrophy, with underlying nephrostomy stent. No hydronephrosis is seen. Mild nonspecific right-sided perinephric stranding is noted. A postoperative seroma is noted at the left renal bed. No renal or ureteral stones are identified. 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 The appendix is not characterized; there is no evidence for appendicitis. The small bowel is grossly unremarkable. The stomach is within normal limits. Vascular/Lymphatic: Scattered calcification is seen along the abdominal aorta and its branches. The abdominal aorta is otherwise grossly unremarkable. Soft tissue inflammation is noted about the infrahepatic IVC, of uncertain significance. No retroperitoneal lymphadenopathy is seen. No pelvic sidewall lymphadenopathy is identified. Reproductive: The patient is status post cystectomy. The uterus is grossly unremarkable. No suspicious adnexal masses are seen. The soft tissue inflammation about the pelvis likely reflects postoperative change. Other: Diffuse subcutaneous varices are noted along the left anterior abdominal wall. Musculoskeletal: No acute osseous abnormalities are identified. The visualized musculature is unremarkable in appearance. IMPRESSION: 1. Soft tissue inflammation about the infrahepatic IVC, of uncertain significance. Mild infection  cannot be excluded. Would correlate clinically to exclude underlying thrombophlebitis. 2. 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 No evidence for obstruction. 3. Moderate right renal atrophy, with underlying nephrostomy stent. 4. Diffuse subcutaneous varices along the left anterior abdominal wall. Aortic Atherosclerosis (ICD10-I70.0). Electronically Signed   By: Garald Balding M.D.   On: 06/11/2017 05:13    Time Spent in minutes  30   Lala Lund M.D on 06/12/2017 at 1:36 PM  Between 7am to 7pm - Pager - [phone] ( page via [email or web address], text pages only, please mention full 10 digit call back number). After 7pm go to [email or web address] - password [email or web address]

## 2017-06-13 ENCOUNTER — Inpatient Hospital Stay: Payer: Self-pay

## 2017-06-13 LAB — URINE CULTURE

## 2017-06-13 MED ORDER — APIXABAN 5 MG PO TABS: 5.0000 mg | ORAL_TABLET | Freq: Two times a day (BID) | ORAL | Status: DC

## 2017-06-13 NOTE — Progress Notes (Signed)
Spoke with Judson Roch, RN regarding patient's order for PICC.  Per Sarah Dr. Candiss Norse has been notified and is to order patient to have an IJ due to patient being on hemodialysis and not a bedside PICC candidate.  Carolee Rota, RN [facility]

## 2017-06-13 NOTE — Progress Notes (Addendum)
@IPLOG @        PROGRESS NOTE                                                                                                                                                                                                             Patient Demographics:    Judy Kelley, is a 52 y.o. female, DOB - 1965-03-12, QJJ:[id number]  Admit date - 06/10/2017   Admitting Physician Florencia Reasons, MD  Outpatient Primary MD for the patient is System, Pcp Not In  LOS - 2  Chief Complaint  Patient presents with  . Abdominal Pain       Brief Narrative Judy Kelley is a very pleasant 52 y.o. 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 Initial evaluation concerning for pyelonephritis. [facility] hospitalists are asked to admit.  Information is obtained from the patient and the chart. She reports her hemodialysis catheter was exchanged 4 days ago. She also has a nephrostomy tube has not been changed since April, she was admitted for right-sided pyelonephritis treatment.     Subjective:   Patient in bed, appears comfortable, denies any headache, no fever, no chest pain or pressure, no shortness of breath , no abdominal pain. No focal weakness.  Improved right-sided flank pain.   Assessment  & Plan :     1.  Right-sided pyelonephritis in the setting of chronic indwelling right-sided nephrostomy tube.  Present urine culture  also showing ESBL E. coli which she has had issues within the past. She is currently on meropenem and clinically seems to be improving, IR also seen the patient and no plans of changing the nephrostomy tube this admission, at this point will place a PICC line to give her a total of 14 days of meropenem and discharge tomorrow.  2.  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  Seen by vascular surgery no further recommendation.  3.  History of DVT, IVC clot.  On Eliquis.  4.  3 of ovarian cancer.  Under remission no acute issues.  Outpatient follow-up with PCP and primary oncologist as needed.  5.  ESRD.  On TTS schedule, renal on board.  Last dialysis 06/11/2017.  6.  Anemia of chronic disease.  No acute issues will monitor.    Diet :  Diet Order           Diet heart healthy/carb modified Room service appropriate? Yes; Fluid consistency: Thin  Diet effective now            Family Communication  :  None  Code Status :  Full  Disposition Plan  :  Home once pyelonephritis gets better  Consults  : IR, vascular surgery, renal  Procedures  :      DVT Prophylaxis  : Eliquis  Lab Results  Component Value Date   PLT 217 06/12/2017    Inpatient Medications  Scheduled Meds: . apixaban  5 mg Oral BID  . calcitRIOL  0.75 mcg Oral Q T,Th,Sa-HD  . cinacalcet  90 mg Oral Q breakfast  . gabapentin  100 mg Oral QHS  . lanthanum  1,000 mg Oral TID WC  . multivitamin  1 tablet Oral QHS  . sertraline  50 mg Oral QHS   Continuous Infusions: . sodium chloride    . sodium chloride    . meropenem (MERREM) IV Stopped (06/11/17 2240)   PRN Meds:.sodium chloride, sodium chloride, acetaminophen, alteplase, heparin, HYDROcodone-acetaminophen, lidocaine (PF), lidocaine-prilocaine, ondansetron **OR** ondansetron (ZOFRAN) IV, pentafluoroprop-tetrafluoroeth, promethazine, senna-docusate  Antibiotics  :     Anti-infectives (From admission, onward)   Start     Dose/Rate Route Frequency Ordered Stop   06/11/17 2200  meropenem (MERREM) 500 mg in sodium chloride 0.9 % 100 mL IVPB     500 mg 200 mL/hr over 30 Minutes Intravenous Every 24 hours 06/11/17 0809     06/11/17 0500  meropenem (MERREM) 1 g in sodium chloride 0.9 % 100 mL IVPB     1 g 200 mL/hr over 30 Minutes Intravenous  Once 06/11/17 0423 06/11/17 0740         Objective:   Vitals:   06/12/17 1556 06/12/17 2050 06/13/17 0500 06/13/17 0515  BP: 100/62 106/87  102/65  Pulse:  79  (!) 54  Resp:      Temp:  99.1 F (37.3 C)  98 F (36.7 C)  TempSrc:  Oral  Oral  SpO2:  98%  96%  Weight:   112.6 kg (248 lb 3.8 oz)   Height:        Wt Readings from Last 3 Encounters:  06/13/17 112.6 kg (248 lb 3.8 oz)  04/23/17 112.2 kg (247 lb 5.7 oz)  04/06/17 113.9 kg (251 lb)     Intake/Output Summary (Last 24 hours) at 06/13/2017 1041 Last data filed at 06/13/2017 0957 Gross per 24 hour  Intake 790 ml  Output 200 ml  Net 590 ml     Physical Exam  Awake Alert, Oriented X 3, No new F.N deficits, Normal affect [place].AT,PERRAL Supple Neck,No JVD, No cervical lymphadenopathy appriciated.  Symmetrical Chest wall movement, Good air movement bilaterally, CTAB RRR,No Gallops, Rubs or new Murmurs, No Parasternal Heave +ve B.Sounds, Abd Soft, No tenderness, No organomegaly appriciated, No rebound - guarding or rigidity. No Cyanosis, Clubbing or edema, No new Rash or bruise right-sided nephrostomy tube in place with mild surrounding tenderness.    Data Review:    CBC Recent Labs  Lab 06/10/17 2100 06/12/17 0259  WBC  9.0 6.4  HGB 10.1* 8.2*  HCT 33.3* 27.6*  PLT 197 217  MCV 98.2 98.2  MCH 29.8 29.2  MCHC 30.3 29.7*  RDW 16.5* 16.6*    Chemistries  Recent Labs  Lab 06/10/17 2100 06/12/17 0300  NA 140 140  K 4.4 4.6  CL 99* 101  CO2 26 25  GLUCOSE 100* 91  BUN 36* 49*  CREATININE 8.79* 10.80*  CALCIUM 9.9 8.8*   AST 14*  --   ALT <5*  --   ALKPHOS 51  --   BILITOT 0.4  --    ------------------------------------------------------------------------------------------------------------------ No results for input(s): CHOL, HDL, LDLCALC, TRIG, CHOLHDL, LDLDIRECT in the last 72 hours.  No results found for: HGBA1C ------------------------------------------------------------------------------------------------------------------ No results for input(s): TSH, T4TOTAL, T3FREE, THYROIDAB in the last 72 hours.  Invalid input(s): FREET3 ------------------------------------------------------------------------------------------------------------------ No results for input(s): VITAMINB12, FOLATE, FERRITIN, TIBC, IRON, RETICCTPCT in the last 72 hours.  Coagulation profile No results for input(s): INR, PROTIME in the last 168 hours.  No results for input(s): DDIMER in the last 72 hours.  Cardiac Enzymes Recent Labs  Lab 06/11/17 0515  TROPONINI <0.03   ------------------------------------------------------------------------------------------------------------------ No results found for: BNP  Micro Results Recent Results (from the past 240 hour(s))  Urine Culture     Status: Abnormal   Collection Time: 06/11/17  4:19 AM  Result Value Ref Range Status   Specimen Description URINE, RANDOM  Final   Special Requests NONE  Final   Culture (A)  Final    >=100,000 COLONIES/mL ESCHERICHIA COLI Confirmed Extended Spectrum Beta-Lactamase Producer (ESBL).  In bloodstream infections from ESBL organisms, carbapenems are preferred over piperacillin/tazobactam. They are shown to have a lower risk of mortality. Performed at [facility], [place] [address], [place], [place] [address]    Report Status 06/13/2017 FINAL  Final   Organism ID, Bacteria ESCHERICHIA COLI (A)  Final      Susceptibility   Escherichia coli - MIC*    AMPICILLIN >=32 RESISTANT Resistant     CEFAZOLIN >=64 RESISTANT Resistant      CEFTRIAXONE >=64 RESISTANT Resistant     CIPROFLOXACIN >=4 RESISTANT Resistant     GENTAMICIN <=1 SENSITIVE Sensitive     IMIPENEM <=0.25 SENSITIVE Sensitive     NITROFURANTOIN <=16 SENSITIVE Sensitive     TRIMETH/SULFA >=320 RESISTANT Resistant     AMPICILLIN/SULBACTAM 16 INTERMEDIATE Intermediate     PIP/TAZO <=4 SENSITIVE Sensitive     Extended ESBL POSITIVE Resistant     * >=100,000 COLONIES/mL ESCHERICHIA COLI  Blood culture (routine x 2)     Status: None (Preliminary result)   Collection Time: 06/11/17  5:15 AM  Result Value Ref Range Status   Specimen Description BLOOD IV START  Final   Special Requests   Final    BOTTLES DRAWN AEROBIC AND ANAEROBIC Blood Culture adequate volume   Culture   Final    NO GROWTH 1 DAY Performed at [facility], [address], [place], [place] [address]    Report Status PENDING  Incomplete  Blood culture (routine x 2)     Status: None (Preliminary result)   Collection Time: 06/11/17  6:10 AM  Result Value Ref Range Status   Specimen Description BLOOD RIGHT HAND  Final   Special Requests   Final    BOTTLES DRAWN AEROBIC AND ANAEROBIC Blood Culture adequate volume   Culture   Final    NO GROWTH 1 DAY Performed at [facility], [place]  [address], [place], [place] [address]    Report Status PENDING  Incomplete    Radiology Reports Dg Chest 2 View  Result Date: 06/11/2017 CLINICAL DATA:  Acute onset of right upper quadrant abdominal pain and nausea after dialysis. EXAM: CHEST - 2 VIEW COMPARISON:  Chest radiograph performed 04/17/2017 FINDINGS: The lungs are well-aerated and clear. There is no evidence of focal opacification, pleural effusion or pneumothorax. The heart is normal in size; the mediastinal contour is within normal limits. No acute osseous abnormalities are seen. A right-sided dual-lumen catheter is noted ending about the cavoatrial junction. Vascular stents are noted overlying the right upper arm. Clips are seen at the left  axilla. The patient's right-sided nephrostomy tube is partially characterized. IMPRESSION: No acute cardiopulmonary process seen. Electronically Signed   By: Garald Balding M.D.   On: 06/11/2017 05:14   Ir Mariana Arn Ivc  Result Date: 06/07/2017 INDICATION: End-stage renal disease, poor functioning right IJ dialysis catheter EXAM: FLUOROSCOPIC EXCHANGE AND REPOSITION OF THE RIGHT IJ TUNNELED DIALYSIS CATHETER SVC AND IVC VENOGRAMS SVC 12 MM VENOUS ANGIOPLASTY FOR CHRONIC CATHETER RELATED FIBRIN SHEATH MEDICATIONS: 1% LIDOCAINE LOCAL ANESTHESIA/SEDATION: Moderate Sedation Time: NONE. Minutes. The patient's level of consciousness and vital signs were monitored continuously by radiology nursing throughout the procedure under my direct supervision. FLUOROSCOPY TIME:  Fluoroscopy Time: 6 minutes 30 seconds (227 mGy). COMPLICATIONS: None immediate. PROCEDURE: Informed written consent was obtained from the patient after a thorough discussion of the procedural risks, benefits and alternatives. All questions were addressed. Maximal Sterile Barrier Technique was utilized including caps, mask, sterile gowns, sterile gloves, sterile drape, hand hygiene and skin antiseptic. A timeout was performed prior to the initiation of the procedure. Under sterile conditions and local anesthesia, the existing tunneled right IJ dialysis catheter terminating in the IVC was removed over an Amplatz guidewire. Ten French sheath inserted to the level of the innominate vein. SVC venogram performed over the guidewire through this sheath. SVC: Chronic catheter related fibrin sheath within the SVC extending into the right atrium. Through this sheath, a Kumpe catheter was advanced into the IVC. Guidewire would not advance distally into the IVC. IVC venogram performed. IVC: Occlusive conical filling defect within the IVC noted at the level of the suprarenal IVC compatible with IVC thrombosis. Amplatz guidewire was reinserted. SVC venous  angioplasty: Over the Amplatz guidewire, overlapping 12 mm angioplasty performed of the SVC with a 12 x 4 atlas balloon. Three overlapping inflations were performed for disruption of the chronic SVC fibrin sheath. Balloon was deflated and removed. SVC venogram repeated. This confirms disruption of the fibrin sheath with preserved patency of the SVC. No significant SVC stenosis. Catheter placement: Over the Amplatz guidewire, a new 19 cm tip to cuff palindrome catheter was advanced with the tip position in the proximal right atrium. Contrast injection confirms excellent flow through the catheter. Blood aspirated easily followed by saline and heparin flushes. Appropriate volume and strength of heparin instilled in both lumens. Catheter secured with a Prolene suture to the skin site. Sterile dressing applied. No immediate complication. Patient tolerated the procedure well. IMPRESSION: SVC venogram confirms chronic catheter related SVC fibrin sheath which was successfully disrupted with 12 mm overlapping venous angioplasty to preserved SVC patency. Guidewire would not advance distally in the IVC. 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 Successful fluoroscopic exchange and reposition of the right IJ dialysis catheter. Tip positioned proximal right atrium. Access ready for use. Electronically Signed   By:  M.  Shick M.D.   On: 06/07/2017 11:09   Ir Fluoro Guide Cv Line Right  Result Date: 06/07/2017 INDICATION: End-stage renal disease, poor functioning right IJ dialysis catheter EXAM: FLUOROSCOPIC EXCHANGE AND REPOSITION OF THE RIGHT IJ TUNNELED DIALYSIS CATHETER SVC AND IVC VENOGRAMS SVC 12 MM VENOUS ANGIOPLASTY FOR CHRONIC CATHETER RELATED FIBRIN SHEATH MEDICATIONS: 1% LIDOCAINE LOCAL ANESTHESIA/SEDATION: Moderate Sedation Time: NONE. Minutes. The patient's level of consciousness and vital signs were monitored continuously by radiology nursing throughout the procedure under my  direct supervision. FLUOROSCOPY TIME:  Fluoroscopy Time: 6 minutes 30 seconds (227 mGy). COMPLICATIONS: None immediate. PROCEDURE: Informed written consent was obtained from the patient after a thorough discussion of the procedural risks, benefits and alternatives. All questions were addressed. Maximal Sterile Barrier Technique was utilized including caps, mask, sterile gowns, sterile gloves, sterile drape, hand hygiene and skin antiseptic. A timeout was performed prior to the initiation of the procedure. Under sterile conditions and local anesthesia, the existing tunneled right IJ dialysis catheter terminating in the IVC was removed over an Amplatz guidewire. Ten French sheath inserted to the level of the innominate vein. SVC venogram performed over the guidewire through this sheath. SVC: Chronic catheter related fibrin sheath within the SVC extending into the right atrium. Through this sheath, a Kumpe catheter was advanced into the IVC. Guidewire would not advance distally into the IVC. IVC venogram performed. IVC: Occlusive conical filling defect within the IVC noted at the level of the suprarenal IVC compatible with IVC thrombosis. Amplatz guidewire was reinserted. SVC venous angioplasty: Over the Amplatz guidewire, overlapping 12 mm angioplasty performed of the SVC with a 12 x 4 atlas balloon. Three overlapping inflations were performed for disruption of the chronic SVC fibrin sheath. Balloon was deflated and removed. SVC venogram repeated. This confirms disruption of the fibrin sheath with preserved patency of the SVC. No significant SVC stenosis. Catheter placement: Over the Amplatz guidewire, a new 19 cm tip to cuff palindrome catheter was advanced with the tip position in the proximal right atrium. Contrast injection confirms excellent flow through the catheter. Blood aspirated easily followed by saline and heparin flushes. Appropriate volume and strength of heparin instilled in both lumens. Catheter  secured with a Prolene suture to the skin site. Sterile dressing applied. No immediate complication. Patient tolerated the procedure well. IMPRESSION: SVC venogram confirms chronic catheter related SVC fibrin sheath which was successfully disrupted with 12 mm overlapping venous angioplasty to preserved SVC patency. Guidewire would not advance distally in the IVC. 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 Successful fluoroscopic exchange and reposition of the right IJ dialysis catheter. Tip positioned proximal right atrium. Access ready for use. Electronically Signed   By: Jerilynn Mages.  Shick M.D.   On: 06/07/2017 11:09   Ir Pta Venous Except Dialysis Circuit  Result Date: 06/07/2017 INDICATION: End-stage renal disease, poor functioning right IJ dialysis catheter EXAM: FLUOROSCOPIC EXCHANGE AND REPOSITION OF THE RIGHT IJ TUNNELED DIALYSIS CATHETER SVC AND IVC VENOGRAMS SVC 12 MM VENOUS ANGIOPLASTY FOR CHRONIC CATHETER RELATED FIBRIN SHEATH MEDICATIONS: 1% LIDOCAINE LOCAL ANESTHESIA/SEDATION: Moderate Sedation Time: NONE. Minutes. The patient's level of consciousness and vital signs were monitored continuously by radiology nursing throughout the procedure under my direct supervision. FLUOROSCOPY TIME:  Fluoroscopy Time: 6 minutes 30 seconds (227 mGy). COMPLICATIONS: None immediate. PROCEDURE: Informed written consent was obtained from the patient after a thorough discussion of the procedural risks, benefits and alternatives. All questions were addressed. Maximal Sterile Barrier Technique was  utilized including caps, mask, sterile gowns, sterile gloves, sterile drape, hand hygiene and skin antiseptic. A timeout was performed prior to the initiation of the procedure. Under sterile conditions and local anesthesia, the existing tunneled right IJ dialysis catheter terminating in the IVC was removed over an Amplatz guidewire. Ten French sheath inserted to the level of the innominate vein. SVC  venogram performed over the guidewire through this sheath. SVC: Chronic catheter related fibrin sheath within the SVC extending into the right atrium. Through this sheath, a Kumpe catheter was advanced into the IVC. Guidewire would not advance distally into the IVC. IVC venogram performed. IVC: Occlusive conical filling defect within the IVC noted at the level of the suprarenal IVC compatible with IVC thrombosis. Amplatz guidewire was reinserted. SVC venous angioplasty: Over the Amplatz guidewire, overlapping 12 mm angioplasty performed of the SVC with a 12 x 4 atlas balloon. Three overlapping inflations were performed for disruption of the chronic SVC fibrin sheath. Balloon was deflated and removed. SVC venogram repeated. This confirms disruption of the fibrin sheath with preserved patency of the SVC. No significant SVC stenosis. Catheter placement: Over the Amplatz guidewire, a new 19 cm tip to cuff palindrome catheter was advanced with the tip position in the proximal right atrium. Contrast injection confirms excellent flow through the catheter. Blood aspirated easily followed by saline and heparin flushes. Appropriate volume and strength of heparin instilled in both lumens. Catheter secured with a Prolene suture to the skin site. Sterile dressing applied. No immediate complication. Patient tolerated the procedure well. IMPRESSION: SVC venogram confirms chronic catheter related SVC fibrin sheath which was successfully disrupted with 12 mm overlapping venous angioplasty to preserved SVC patency. Guidewire would not advance distally in the IVC. 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 Successful fluoroscopic exchange and reposition of the right IJ dialysis catheter. Tip positioned proximal right atrium. Access ready for use. Electronically Signed   By: Jerilynn Mages.  Shick M.D.   On: 06/07/2017 11:09   Ct Renal Stone Study  Result Date: 06/11/2017 CLINICAL DATA:  Acute onset of right  upper quadrant abdominal pain and nausea after dialysis. Abdominal belching and flatulence. EXAM: CT ABDOMEN AND PELVIS WITHOUT CONTRAST TECHNIQUE: Multidetector CT imaging of the abdomen and pelvis was performed following the standard protocol without IV contrast. COMPARISON:  CT of the abdomen and pelvis from 04/16/2017 FINDINGS: Lower chest: The visualized lung bases are grossly clear. The visualized portions of the mediastinum are unremarkable. Hepatobiliary: The liver is unremarkable in appearance. The gallbladder is unremarkable in appearance. The common bile duct remains normal in caliber. Pancreas: The pancreas is within normal limits. Spleen: The spleen is unremarkable in appearance. Adrenals/Urinary Tract: The adrenal glands are unremarkable in appearance. The patient is status post left-sided nephrectomy. There is moderate right renal atrophy, with underlying nephrostomy stent. No hydronephrosis is seen. Mild nonspecific right-sided perinephric stranding is noted. A postoperative seroma is noted at the left renal bed. No renal or ureteral stones are identified. 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 The appendix is not characterized; there is no evidence for appendicitis. The small bowel is grossly unremarkable. The stomach is within normal limits. Vascular/Lymphatic: Scattered calcification is seen along the abdominal aorta and its branches. The abdominal aorta is otherwise grossly unremarkable. Soft tissue inflammation is noted about the infrahepatic IVC, of uncertain significance. No retroperitoneal lymphadenopathy is seen. No pelvic sidewall lymphadenopathy is  identified. Reproductive: The patient is status post cystectomy. The uterus is grossly unremarkable. No suspicious adnexal masses are seen. The soft tissue inflammation about the pelvis likely reflects postoperative change. Other: Diffuse  subcutaneous varices are noted along the left anterior abdominal wall. Musculoskeletal: No acute osseous abnormalities are identified. The visualized musculature is unremarkable in appearance. IMPRESSION: 1. Soft tissue inflammation about the infrahepatic IVC, of uncertain significance. Mild infection cannot be excluded. Would correlate clinically to exclude underlying thrombophlebitis. 2. 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 No evidence for obstruction. 3. Moderate right renal atrophy, with underlying nephrostomy stent. 4. Diffuse subcutaneous varices along the left anterior abdominal wall. Aortic Atherosclerosis (ICD10-I70.0). Electronically Signed   By: Garald Balding M.D.   On: 06/11/2017 05:13   [place] Ekg Site Rite  Result Date: 06/13/2017 If Site Rite image not attached, placement could not be confirmed due to current cardiac rhythm.   Time Spent in minutes  30   Lala Lund M.D on 06/13/2017 at 10:41 AM  Between 7am to 7pm - Pager - [phone] ( page via [email or web address], text pages only, please mention full 10 digit call back number). After 7pm go to [email or web address] - password [email or web address]

## 2017-06-13 NOTE — Progress Notes (Signed)
  Discussed with Dr. Kathlene Cote.  Since patient's symptoms are improving, will plan for routine exchange on 07/06/2017.  Blima Jaimes S Ethin Drummond PA-C 06/13/2017 10:53 AM

## 2017-06-13 NOTE — Progress Notes (Signed)
  [facility] Progress Note   Subjective:  No interval issues Pt w/o complaint   Objective Vitals:   06/12/17 1556 06/12/17 2050 06/13/17 0500 06/13/17 0515  BP: 100/62 106/87  102/65  Pulse:  79  (!) 54  Resp:      Temp:  99.1 F (37.3 C)  98 F (36.7 C)  TempSrc:  Oral  Oral  SpO2:  98%  96%  Weight:   112.6 kg (248 lb 3.8 oz)   Height:       Physical Exam General: Obese female, sitting up in bed NAD  Lungs: CTA bilaterally without wheezes, rales, or rhonchi.  Heart: RRR with S1 S2 Abdomen: soft NT R nephrostomy in place,  Cloudy urine in bag  Lower extremities: Trace LE edema. L>R  Psych:  Pleasant. Normal affect  Dialysis Access:R IJ TDC dsg clean    Dialysis Orders:  [facility]  4h 180NRe BFR 325/800 EDW 113kg 2K/2Ca  TDC Heparin 4000 U Bolus, 500 U q 22min x3  -Mircera 127mcg IV q 2 weeks (last 6/6) -Calcitriol 0.57mcg PO TIW   Assessment/Plan: 1. R pyelonephritis seen on CT; urine Cx with ESBL E. Coli on meropenem. Chronic R nephrostomy seen by IR.    2. ESRD -  TTS. Continue on schedule. Next HD 6/11: to EDW, 2K, IVB hep, TDC Qb 400 3. Hypertension/volume  - BP low/stable. No volume excess on exam. Post HD wt 113.7kg.  4. Anemia  - Hgb 10.1 No ESA needs currently 5. Metabolic bone disease -  Continue Calcitriol/Sensipar/binders  6. Nutrition - Renal vitamins 7. 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     Additional Objective Labs: Basic Metabolic Panel: Recent Labs  Lab 06/10/17 2100 06/12/17 0300  NA 140 140  K 4.4 4.6  CL 99* 101  CO2 26 25  GLUCOSE 100* 91  BUN 36* 49*  CREATININE 8.79* 10.80*  CALCIUM 9.9 8.8*  PHOS  --  7.5*   CBC: Recent Labs  Lab 06/10/17 2100 06/12/17 0259  WBC 9.0 6.4  HGB 10.1* 8.2*  HCT 33.3* 27.6*  MCV 98.2 98.2  PLT 197 217   Blood Culture    Component Value Date/Time   SDES BLOOD RIGHT HAND 06/11/2017 0610   SPECREQUEST  06/11/2017 0610    BOTTLES DRAWN AEROBIC AND ANAEROBIC  Blood Culture adequate volume   CULT  06/11/2017 0610    NO GROWTH 1 DAY Performed at [facility], [place] [address] [place], [place] [address]    REPTSTATUS PENDING 06/11/2017 0610    Cardiac Enzymes: Recent Labs  Lab 06/11/17 0515  TROPONINI <0.03   CBG: Recent Labs  Lab 06/12/17 0648  GLUCAP 94   Iron Studies: No results for input(s): IRON, TIBC, TRANSFERRIN, FERRITIN in the last 72 hours. Lab Results  Component Value Date   INR 0.97 03/21/2017   INR 1.05 03/21/2017   INR 1.14 12/03/2016   Medications: . sodium chloride    . sodium chloride    . meropenem (MERREM) IV Stopped (06/11/17 2240)   . apixaban  5 mg Oral BID  . calcitRIOL  0.75 mcg Oral Q T,Th,Sa-HD  . cinacalcet  90 mg Oral Q breakfast  . gabapentin  100 mg Oral QHS  . lanthanum  1,000 mg Oral TID WC  . multivitamin  1 tablet Oral QHS  . sertraline  50 mg Oral QHS

## 2017-06-14 LAB — CBC
HEMATOCRIT: 30 % — AB (ref 36.0–46.0)
HEMOGLOBIN: 9 g/dL — AB (ref 12.0–15.0)
MCH: 29.1 pg (ref 26.0–34.0)
MCHC: 30 g/dL (ref 30.0–36.0)
MCV: 97.1 fL (ref 78.0–100.0)
Platelets: 310 10*3/uL (ref 150–400)
RBC: 3.09 MIL/uL — ABNORMAL LOW (ref 3.87–5.11)
RDW: 16.4 % — AB (ref 11.5–15.5)
WBC: 6.4 10*3/uL (ref 4.0–10.5)

## 2017-06-14 LAB — RENAL FUNCTION PANEL
ALBUMIN: 2.9 g/dL — AB (ref 3.5–5.0)
Anion gap: 14 (ref 5–15)
BUN: 41 mg/dL — AB (ref 6–20)
CO2: 26 mmol/L (ref 22–32)
Calcium: 8.5 mg/dL — ABNORMAL LOW (ref 8.9–10.3)
Chloride: 99 mmol/L — ABNORMAL LOW (ref 101–111)
Creatinine, Ser: 10.32 mg/dL — ABNORMAL HIGH (ref 0.44–1.00)
GFR calc Af Amer: 4 mL/min — ABNORMAL LOW (ref >60)
GFR, EST NON AFRICAN AMERICAN: 4 mL/min — AB (ref >60)
Glucose, Bld: 75 mg/dL (ref 65–99)
PHOSPHORUS: 6.8 mg/dL — AB (ref 2.5–4.6)
POTASSIUM: 4.6 mmol/L (ref 3.5–5.1)
SODIUM: 139 mmol/L (ref 135–145)

## 2017-06-14 MED ORDER — SODIUM CHLORIDE 0.9 % IV SOLN: 100.0000 mL | INTRAVENOUS | Status: DC | PRN

## 2017-06-14 MED ORDER — ALTEPLASE 2 MG IJ SOLR
2.0000 mg | Freq: Once | INTRAMUSCULAR | Status: DC | PRN
  Filled 2017-06-14: qty 2

## 2017-06-14 MED ORDER — MEROPENEM IV (FOR PTA / DISCHARGE USE ONLY): 500.0000 mg | INTRAVENOUS | 0 refills | Status: AC

## 2017-06-14 MED ORDER — CALCITRIOL 0.25 MCG PO CAPS
ORAL_CAPSULE | ORAL | Status: AC
  Administered 2017-06-14: 0.75 ug via ORAL
  Filled 2017-06-14: qty 3

## 2017-06-14 MED ORDER — HEPARIN SODIUM (PORCINE) 1000 UNIT/ML DIALYSIS
1000.0000 [IU] | INTRAMUSCULAR | Status: DC | PRN
  Filled 2017-06-14: qty 1

## 2017-06-14 MED ORDER — SODIUM CHLORIDE 0.9 % IV SOLN
500.0000 mg | INTRAVENOUS | Status: DC
  Administered 2017-06-14 – 2017-06-15 (×2): 500 mg via INTRAVENOUS
  Filled 2017-06-14 (×2): qty 0.5

## 2017-06-14 MED ORDER — HYDROCODONE-ACETAMINOPHEN 5-325 MG PO TABS
ORAL_TABLET | ORAL | Status: AC
  Administered 2017-06-14: 2 via ORAL
  Filled 2017-06-14: qty 2

## 2017-06-14 MED ORDER — HEPARIN SODIUM (PORCINE) 1000 UNIT/ML DIALYSIS
4000.0000 [IU] | Freq: Once | INTRAMUSCULAR | Status: DC
  Filled 2017-06-14: qty 4

## 2017-06-14 NOTE — Procedures (Signed)
I was present at this dialysis session. I have reviewed the session itself and made appropriate changes.   For powerline as unable to achieve reliable IV access. HD as ordered. No acute events. Labs pending.   Filed Weights   06/12/17 0632 06/13/17 0500 06/14/17 0500  Weight: 113.7 kg (250 lb 10.6 oz) 112.6 kg (248 lb 3.8 oz) 118.9 kg (262 lb 2 oz)    Recent Labs  Lab 06/12/17 0300  NA 140  K 4.6  CL 101  CO2 25  GLUCOSE 91  BUN 49*  CREATININE 10.80*  CALCIUM 8.8*  PHOS 7.5*    Recent Labs  Lab 06/10/17 2100 06/12/17 0259  WBC 9.0 6.4  HGB 10.1* 8.2*  HCT 33.3* 27.6*  MCV 98.2 98.2  PLT 197 217    Scheduled Meds: . [START ON 06/15/2017] apixaban  5 mg Oral BID  . calcitRIOL  0.75 mcg Oral Q T,Th,Sa-HD  . cinacalcet  90 mg Oral Q breakfast  . gabapentin  100 mg Oral QHS  . heparin  4,000 Units Dialysis Once in dialysis  . lanthanum  1,000 mg Oral TID WC  . multivitamin  1 tablet Oral QHS  . sertraline  50 mg Oral QHS   Continuous Infusions: . sodium chloride    . sodium chloride    . sodium chloride    . sodium chloride    . meropenem (MERREM) IV Stopped (06/11/17 2240)   PRN Meds:.sodium chloride, sodium chloride, sodium chloride, sodium chloride, acetaminophen, alteplase, alteplase, heparin, heparin, HYDROcodone-acetaminophen, lidocaine (PF), lidocaine-prilocaine, ondansetron **OR** ondansetron (ZOFRAN) IV, pentafluoroprop-tetrafluoroeth, promethazine, senna-docusate   Pearson Grippe  MD 06/14/2017, 8:23 AM

## 2017-06-14 NOTE — Care Management Note (Signed)
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  PCP:  Action/Plan: Transition to home with home health services. Pt with need 2 weeks IV ABX therapy. Referral made with [facility] for home health/ IV abx therapy services.  Expected Discharge Date:  06/14/17               Expected Discharge Plan:  [place]  In-House Referral:     Discharge planning Services  CM Consult  Post Acute Care Choice:    Choice offered to:  Patient  DME Arranged:  IV pump/equipment DME Agency:  [place]:  RN [facility] Agency:  [place]  Status of Service:  Completed, signed off  If discussed at [place] of Stay Meetings, dates discussed:    Additional Comments:  Sharin Mons, RN 06/14/2017, 12:10 PM

## 2017-06-14 NOTE — Discharge Summary (Signed)
Judy Kelley VQQ:[id number] DOB: [birth year] DOA: 06/10/2017  PCP: System, Pcp Not In  Admit date: 06/10/2017  Discharge date: 06/14/2017  Admitted From: Home  Disposition:  Home   Recommendations for Outpatient Follow-up:   Follow up with PCP in 1-2 weeks  PCP Please obtain BMP/CBC, 2 view CXR in 1week,  (see Discharge instructions)   PCP Please follow up on the following pending results: Remove PICC line in 2 weeks.   Home Health: RN  Equipment/Devices: None  Consultations: Renal, IR, ID over the phone Dr Linus Salmons Discharge Condition: Stab;e   CODE STATUS:   Full Diet Recommendation: Renal   Chief Complaint  Patient presents with  . 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 Initial evaluation concerning for pyelonephritis. [facility] hospitalists are asked to admit.  Information is obtained from the patient and the chart. She reports her hemodialysis catheter was exchanged 4 days ago. She also has a nephrostomy tube has not been changed since April, she was admitted for right-sided  pyelonephritis treatment.                                                                   Hospital Course   1.  Right-sided pyelonephritis in the setting of chronic indwelling right-sided nephrostomy tube.  Present urine culture also showing ESBL E. coli which she has had issues within the past. She is currently on meropenem and clinically seems to be improving, IR also seen the patient and no plans of changing the nephrostomy tube this admission, at this point due to her dialysis status will have IR place a IJ PICC line and she will get IV meropenem at home for the next 10 days, thereafter request PCP to discontinue the PICC line.  Home health RN has been requested as well.  Patient clinically much better and close to her baseline now.  2.  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  Seen by vascular surgery no further recommendation.  3.  History of DVT, IVC clot.  On Eliquis.  4.  3 of ovarian cancer.  Under remission no acute issues.  Outpatient follow-up with PCP and primary oncologist as needed.  5.  ESRD.  On TTS schedule, renal on board.  Last dialysis 06/11/2017.  6.  Anemia of chronic disease.  No acute issues will monitor.     Discharge diagnosis     Principal Problem:   Pyelonephritis Active Problems:   History of ovarian cancer   ESRD (end stage renal disease) (HCC)   Chronic anticoagulation   Abdominal pain   DVT (deep venous thrombosis) (HCC)   Anemia    Discharge instructions    Discharge Instructions    Discharge instructions   Complete by:  As directed    Follow with Primary MD System, Pcp Not In in 7 days   Get CBC, CMP, 2 view Chest X ray checked  by Primary MD  in 5-7 days   Activity: As tolerated with Full fall precautions use walker/cane & assistance as needed  Disposition    Diet:   Renal  For Heart failure patients - Check your Weight same time  everyday, if you gain over 2 pounds, or you develop in leg swelling, experience more shortness of breath or chest pain, call your Primary MD immediately. Follow Cardiac Low Salt Diet and 1.5 lit/day fluid restriction.  Special Instructions: If you have smoked or chewed Tobacco  in the last 2 yrs please stop smoking, stop any regular Alcohol  and or any Recreational drug use.  On your next visit with your primary care physician please Get Medicines reviewed and adjusted.  Please request your Prim.MD to go over all Hospital Tests and Procedure/Radiological results at the follow up, please get all Hospital records sent to your Prim MD by signing hospital release before you go home.  If you experience worsening of your admission symptoms, develop shortness of breath, life threatening emergency, suicidal or homicidal thoughts you must seek medical attention immediately by calling 911 or calling your MD immediately  if symptoms less severe.  You Must read complete instructions/literature along with all the possible adverse reactions/side effects for all the Medicines you take and that have been prescribed to you. Take any new Medicines after you have completely understood and accpet all the possible adverse reactions/side effects.   Home infusion instructions [facility] May follow [place] Dosing Protocol; May administer Cathflo as needed to maintain patency of vascular access device.; Flushing of vascular access device: per [facility] Protocol: 0.9% NaCl pre/post medica...   Complete by:  As directed    Instructions:  May follow [place] Dosing Protocol   Instructions:  May administer Cathflo as needed to maintain patency of vascular access device.   Instructions:  Flushing of vascular access device: per [facility] Protocol: 0.9% NaCl pre/post medication administration and prn patency; Heparin 100 u/ml, 62m for implanted ports and Heparin 10u/ml, 52mfor all other central venous catheters.    Instructions:  May follow [facility] Anaphylaxis Protocol for First Dose Administration in the home: 0.9% NaCl at 25-50 ml/hr to maintain IV access for protocol meds. Epinephrine 0.3 ml IV/IM PRN and Benadryl 25-50 IV/IM PRN s/s of anaphylaxis.   Instructions:  [facility] Coordinator (RN) to assist per patient IV care needs in the home PRN.   Increase activity slowly   Complete by:  As directed       Discharge Medications   Allergies as of 06/14/2017      Reactions   Ciprofloxacin Other (See Comments)   IV only-burning to her  veins, can take PO   Erythromycin Anaphylaxis   Chlorhexidine Rash   Other Rash, Other (See Comments)   chloraprep      Medication List    TAKE these medications   acetaminophen 500 MG tablet Commonly known as:  TYLENOL Take 1,000 mg by mouth every 6 (six) hours as needed for mild pain or headache.   cinacalcet 90 MG tablet Commonly known as:  SENSIPAR Take 90 mg by mouth daily.   ELIQUIS 5 MG Tabs tablet Generic drug:  apixaban Take 5 mg by mouth 2 (two) times daily.   gabapentin 100 MG capsule Commonly known as:  NEURONTIN Take 100 mg by mouth at bedtime.   lanthanum 1000 MG chewable tablet Commonly known as:  FOSRENOL Chew 1 tablet (1,000 mg total) by mouth 3 (three) times daily with meals.   loperamide 2 MG capsule Commonly known as:  IMODIUM Take 1 capsule (2 mg total) by mouth as needed for diarrhea or loose stools.   meropenem IVPB Commonly known as:  MERREM Inject 500 mg into the vein daily for 10 days. Indication:  Pyelo Last Day of Therapy:  06/24/2017 Labs - Once weekly:  CBC/D and BMP, Labs - Every other week:  ESR and CRP   multivitamin Tabs tablet Take 1 tablet by mouth daily.   mupirocin ointment 2 % Commonly known as:  BACTROBAN Apply 1 application topically 2 (two) times daily as needed (rash).   promethazine 25 MG tablet Commonly known as:  PHENERGAN Take 25 mg by mouth every 6 (six) hours as needed for  nausea or vomiting.   sertraline 50 MG tablet Commonly known as:  ZOLOFT Take 50 mg by mouth at bedtime.            Home Infusion Instuctions  (From admission, onward)        Start     Ordered   06/14/17 0000  Home infusion instructions [facility] May follow [place] Dosing Protocol; May administer Cathflo as needed to maintain patency of vascular access device.; Flushing of vascular access device: per [facility] Protocol: 0.9% NaCl pre/post medica...    Question Answer Comment  Instructions May follow [place] Dosing Protocol   Instructions May administer Cathflo as needed to maintain patency of vascular access device.   Instructions Flushing of vascular access device: per [facility] Protocol: 0.9% NaCl pre/post medication administration and prn patency; Heparin 100 u/ml, 19m for implanted ports and Heparin 10u/ml, 522mfor all other central venous catheters.   Instructions May follow [facility] Anaphylaxis Protocol for First Dose Administration in the home: 0.9% NaCl at 25-50 ml/hr to maintain IV access for protocol meds. Epinephrine 0.3 ml IV/IM PRN and Benadryl 25-50 IV/IM PRN s/s of anaphylaxis.   Instructions [facility] Infusion Coordinator (RN) to assist per patient IV care needs in the home PRN.      06/14/17 1141      Follow-up Information    Your PCP Follow up in 1 day(s).           Major procedures and Radiology Reports - PLEASE review detailed and final reports thoroughly  -     IJ PICC line by IR today   Dg Chest 2 View  Result Date: 06/11/2017 CLINICAL DATA:  Acute onset of right upper quadrant abdominal pain and nausea after dialysis. EXAM: CHEST - 2 VIEW COMPARISON:  Chest radiograph performed 04/17/2017 FINDINGS: The lungs are well-aerated and clear. There is no evidence of focal opacification, pleural effusion  or pneumothorax. The heart is normal in size; the mediastinal contour is within normal limits. No acute osseous abnormalities are seen. A  right-sided dual-lumen catheter is noted ending about the cavoatrial junction. Vascular stents are noted overlying the right upper arm. Clips are seen at the left axilla. The patient's right-sided nephrostomy tube is partially characterized. IMPRESSION: No acute cardiopulmonary process seen. Electronically Signed   By: Garald Balding M.D.   On: 06/11/2017 05:14   Ir Mariana Arn Ivc  Result Date: 06/07/2017 INDICATION: End-stage renal disease, poor functioning right IJ dialysis catheter EXAM: FLUOROSCOPIC EXCHANGE AND REPOSITION OF THE RIGHT IJ TUNNELED DIALYSIS CATHETER SVC AND IVC VENOGRAMS SVC 12 MM VENOUS ANGIOPLASTY FOR CHRONIC CATHETER RELATED FIBRIN SHEATH MEDICATIONS: 1% LIDOCAINE LOCAL ANESTHESIA/SEDATION: Moderate Sedation Time: NONE. Minutes. The patient's level of consciousness and vital signs were monitored continuously by radiology nursing throughout the procedure under my direct supervision. FLUOROSCOPY TIME:  Fluoroscopy Time: 6 minutes 30 seconds (227 mGy). COMPLICATIONS: None immediate. PROCEDURE: Informed written consent was obtained from the patient after a thorough discussion of the procedural risks, benefits and alternatives. All questions were addressed. Maximal Sterile Barrier Technique was utilized including caps, mask, sterile gowns, sterile gloves, sterile drape, hand hygiene and skin antiseptic. A timeout was performed prior to the initiation of the procedure. Under sterile conditions and local anesthesia, the existing tunneled right IJ dialysis catheter terminating in the IVC was removed over an Amplatz guidewire. Ten French sheath inserted to the level of the innominate vein. SVC venogram performed over the guidewire through this sheath. SVC: Chronic catheter related fibrin sheath within the SVC extending into the right atrium. Through this sheath, a Kumpe catheter was advanced into the IVC. Guidewire would not advance distally into the IVC. IVC venogram performed. IVC: Occlusive  conical filling defect within the IVC noted at the level of the suprarenal IVC compatible with IVC thrombosis. Amplatz guidewire was reinserted. SVC venous angioplasty: Over the Amplatz guidewire, overlapping 12 mm angioplasty performed of the SVC with a 12 x 4 atlas balloon. Three overlapping inflations were performed for disruption of the chronic SVC fibrin sheath. Balloon was deflated and removed. SVC venogram repeated. This confirms disruption of the fibrin sheath with preserved patency of the SVC. No significant SVC stenosis. Catheter placement: Over the Amplatz guidewire, a new 19 cm tip to cuff palindrome catheter was advanced with the tip position in the proximal right atrium. Contrast injection confirms excellent flow through the catheter. Blood aspirated easily followed by saline and heparin flushes. Appropriate volume and strength of heparin instilled in both lumens. Catheter secured with a Prolene suture to the skin site. Sterile dressing applied. No immediate complication. Patient tolerated the procedure well. IMPRESSION: SVC venogram confirms chronic catheter related SVC fibrin sheath which was successfully disrupted with 12 mm overlapping venous angioplasty to preserved SVC patency. Guidewire would not advance distally in the IVC. 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 Successful fluoroscopic exchange and reposition of the right IJ dialysis catheter. Tip positioned proximal right atrium. Access ready for use. Electronically Signed   By: Jerilynn Mages.  Shick M.D.   On: 06/07/2017 11:09   Ir Fluoro Guide Cv Line Right  Result Date: 06/07/2017 INDICATION: End-stage renal disease, poor functioning right IJ dialysis catheter EXAM: FLUOROSCOPIC EXCHANGE AND REPOSITION OF THE RIGHT IJ TUNNELED DIALYSIS CATHETER SVC AND IVC VENOGRAMS SVC 12 MM VENOUS ANGIOPLASTY FOR CHRONIC CATHETER RELATED FIBRIN SHEATH MEDICATIONS: 1% LIDOCAINE LOCAL ANESTHESIA/SEDATION: Moderate Sedation  Time: NONE.  Minutes. The patient's level of consciousness and vital signs were monitored continuously by radiology nursing throughout the procedure under my direct supervision. FLUOROSCOPY TIME:  Fluoroscopy Time: 6 minutes 30 seconds (227 mGy). COMPLICATIONS: None immediate. PROCEDURE: Informed written consent was obtained from the patient after a thorough discussion of the procedural risks, benefits and alternatives. All questions were addressed. Maximal Sterile Barrier Technique was utilized including caps, mask, sterile gowns, sterile gloves, sterile drape, hand hygiene and skin antiseptic. A timeout was performed prior to the initiation of the procedure. Under sterile conditions and local anesthesia, the existing tunneled right IJ dialysis catheter terminating in the IVC was removed over an Amplatz guidewire. Ten French sheath inserted to the level of the innominate vein. SVC venogram performed over the guidewire through this sheath. SVC: Chronic catheter related fibrin sheath within the SVC extending into the right atrium. Through this sheath, a Kumpe catheter was advanced into the IVC. Guidewire would not advance distally into the IVC. IVC venogram performed. IVC: Occlusive conical filling defect within the IVC noted at the level of the suprarenal IVC compatible with IVC thrombosis. Amplatz guidewire was reinserted. SVC venous angioplasty: Over the Amplatz guidewire, overlapping 12 mm angioplasty performed of the SVC with a 12 x 4 atlas balloon. Three overlapping inflations were performed for disruption of the chronic SVC fibrin sheath. Balloon was deflated and removed. SVC venogram repeated. This confirms disruption of the fibrin sheath with preserved patency of the SVC. No significant SVC stenosis. Catheter placement: Over the Amplatz guidewire, a new 19 cm tip to cuff palindrome catheter was advanced with the tip position in the proximal right atrium. Contrast injection confirms excellent flow through the  catheter. Blood aspirated easily followed by saline and heparin flushes. Appropriate volume and strength of heparin instilled in both lumens. Catheter secured with a Prolene suture to the skin site. Sterile dressing applied. No immediate complication. Patient tolerated the procedure well. IMPRESSION: SVC venogram confirms chronic catheter related SVC fibrin sheath which was successfully disrupted with 12 mm overlapping venous angioplasty to preserved SVC patency. Guidewire would not advance distally in the IVC. 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 Successful fluoroscopic exchange and reposition of the right IJ dialysis catheter. Tip positioned proximal right atrium. Access ready for use. Electronically Signed   By: Jerilynn Mages.  Shick M.D.   On: 06/07/2017 11:09   Ir Pta Venous Except Dialysis Circuit  Result Date: 06/07/2017 INDICATION: End-stage renal disease, poor functioning right IJ dialysis catheter EXAM: FLUOROSCOPIC EXCHANGE AND REPOSITION OF THE RIGHT IJ TUNNELED DIALYSIS CATHETER SVC AND IVC VENOGRAMS SVC 12 MM VENOUS ANGIOPLASTY FOR CHRONIC CATHETER RELATED FIBRIN SHEATH MEDICATIONS: 1% LIDOCAINE LOCAL ANESTHESIA/SEDATION: Moderate Sedation Time: NONE. Minutes. The patient's level of consciousness and vital signs were monitored continuously by radiology nursing throughout the procedure under my direct supervision. FLUOROSCOPY TIME:  Fluoroscopy Time: 6 minutes 30 seconds (227 mGy). COMPLICATIONS: None immediate. PROCEDURE: Informed written consent was obtained from the patient after a thorough discussion of the procedural risks, benefits and alternatives. All questions were addressed. Maximal Sterile Barrier Technique was utilized including caps, mask, sterile gowns, sterile gloves, sterile drape, hand hygiene and skin antiseptic. A timeout was performed prior to the initiation of the procedure. Under sterile conditions and local anesthesia, the existing tunneled right  IJ dialysis catheter terminating in the IVC was removed over an Amplatz guidewire. Ten French sheath inserted to the level of the innominate vein. SVC venogram performed over the guidewire through  this sheath. SVC: Chronic catheter related fibrin sheath within the SVC extending into the right atrium. Through this sheath, a Kumpe catheter was advanced into the IVC. Guidewire would not advance distally into the IVC. IVC venogram performed. IVC: Occlusive conical filling defect within the IVC noted at the level of the suprarenal IVC compatible with IVC thrombosis. Amplatz guidewire was reinserted. SVC venous angioplasty: Over the Amplatz guidewire, overlapping 12 mm angioplasty performed of the SVC with a 12 x 4 atlas balloon. Three overlapping inflations were performed for disruption of the chronic SVC fibrin sheath. Balloon was deflated and removed. SVC venogram repeated. This confirms disruption of the fibrin sheath with preserved patency of the SVC. No significant SVC stenosis. Catheter placement: Over the Amplatz guidewire, a new 19 cm tip to cuff palindrome catheter was advanced with the tip position in the proximal right atrium. Contrast injection confirms excellent flow through the catheter. Blood aspirated easily followed by saline and heparin flushes. Appropriate volume and strength of heparin instilled in both lumens. Catheter secured with a Prolene suture to the skin site. Sterile dressing applied. No immediate complication. Patient tolerated the procedure well. IMPRESSION: SVC venogram confirms chronic catheter related SVC fibrin sheath which was successfully disrupted with 12 mm overlapping venous angioplasty to preserved SVC patency. Guidewire would not advance distally in the IVC. 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 Successful fluoroscopic exchange and reposition of the right IJ dialysis catheter. Tip positioned proximal right atrium. Access ready for use.  Electronically Signed   By: Jerilynn Mages.  Shick M.D.   On: 06/07/2017 11:09   Ct Renal Stone Study  Result Date: 06/11/2017 CLINICAL DATA:  Acute onset of right upper quadrant abdominal pain and nausea after dialysis. Abdominal belching and flatulence. EXAM: CT ABDOMEN AND PELVIS WITHOUT CONTRAST TECHNIQUE: Multidetector CT imaging of the abdomen and pelvis was performed following the standard protocol without IV contrast. COMPARISON:  CT of the abdomen and pelvis from 04/16/2017 FINDINGS: Lower chest: The visualized lung bases are grossly clear. The visualized portions of the mediastinum are unremarkable. Hepatobiliary: The liver is unremarkable in appearance. The gallbladder is unremarkable in appearance. The common bile duct remains normal in caliber. Pancreas: The pancreas is within normal limits. Spleen: The spleen is unremarkable in appearance. Adrenals/Urinary Tract: The adrenal glands are unremarkable in appearance. The patient is status post left-sided nephrectomy. There is moderate right renal atrophy, with underlying nephrostomy stent. No hydronephrosis is seen. Mild nonspecific right-sided perinephric stranding is noted. A postoperative seroma is noted at the left renal bed. No renal or ureteral stones are identified. 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 The appendix is not characterized; there is no evidence for appendicitis. The small bowel is grossly unremarkable. The stomach is within normal limits. Vascular/Lymphatic: Scattered calcification is seen along the abdominal aorta and its branches. The abdominal aorta is otherwise grossly unremarkable. Soft tissue inflammation is noted about the infrahepatic IVC, of uncertain significance. No retroperitoneal lymphadenopathy is seen. No pelvic sidewall lymphadenopathy is identified. Reproductive: The patient is status post cystectomy. The uterus is grossly  unremarkable. No suspicious adnexal masses are seen. The soft tissue inflammation about the pelvis likely reflects postoperative change. Other: Diffuse subcutaneous varices are noted along the left anterior abdominal wall. Musculoskeletal: No acute osseous abnormalities are identified. The visualized musculature is unremarkable in appearance. IMPRESSION: 1. Soft tissue inflammation about the infrahepatic IVC, of uncertain  significance. Mild infection cannot be excluded. Would correlate clinically to exclude underlying thrombophlebitis. 2. 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 No evidence for obstruction. 3. Moderate right renal atrophy, with underlying nephrostomy stent. 4. Diffuse subcutaneous varices along the left anterior abdominal wall. Aortic Atherosclerosis (ICD10-I70.0). Electronically Signed   By: Garald Balding M.D.   On: 06/11/2017 05:13   [place] Ekg Site Rite  Result Date: 06/13/2017 If Site Rite image not attached, placement could not be confirmed due to current cardiac rhythm.   Micro Results     Recent Results (from the past 240 hour(s))  Urine Culture     Status: Abnormal   Collection Time: 06/11/17  4:19 AM  Result Value Ref Range Status   Specimen Description URINE, RANDOM  Final   Special Requests NONE  Final   Culture (A)  Final    >=100,000 COLONIES/mL ESCHERICHIA COLI Confirmed Extended Spectrum Beta-Lactamase Producer (ESBL).  In bloodstream infections from ESBL organisms, carbapenems are preferred over piperacillin/tazobactam. They are shown to have a lower risk of mortality. Performed at [facility], [place] [address] [place], [place] [address]    Report Status 06/13/2017 FINAL  Final   Organism ID, Bacteria ESCHERICHIA COLI (A)  Final      Susceptibility   Escherichia coli - MIC*    AMPICILLIN >=32 RESISTANT Resistant     CEFAZOLIN >=64 RESISTANT Resistant     CEFTRIAXONE >=64  RESISTANT Resistant     CIPROFLOXACIN >=4 RESISTANT Resistant     GENTAMICIN <=1 SENSITIVE Sensitive     IMIPENEM <=0.25 SENSITIVE Sensitive     NITROFURANTOIN <=16 SENSITIVE Sensitive     TRIMETH/SULFA >=320 RESISTANT Resistant     AMPICILLIN/SULBACTAM 16 INTERMEDIATE Intermediate     PIP/TAZO <=4 SENSITIVE Sensitive     Extended ESBL POSITIVE Resistant     * >=100,000 COLONIES/mL ESCHERICHIA COLI  Blood culture (routine x 2)     Status: None (Preliminary result)   Collection Time: 06/11/17  5:15 AM  Result Value Ref Range Status   Specimen Description BLOOD IV START  Final   Special Requests   Final    BOTTLES DRAWN AEROBIC AND ANAEROBIC Blood Culture adequate volume   Culture   Final    NO GROWTH 2 DAYS Performed at [facility], [address], [place], [place] [address]    Report Status PENDING  Incomplete  Blood culture (routine x 2)     Status: None (Preliminary result)   Collection Time: 06/11/17  6:10 AM  Result Value Ref Range Status   Specimen Description BLOOD RIGHT HAND  Final   Special Requests   Final    BOTTLES DRAWN AEROBIC AND ANAEROBIC Blood Culture adequate volume   Culture   Final    NO GROWTH 2 DAYS Performed at [facility], [place] [address] [place], [place] [address]    Report Status PENDING  Incomplete    Today   Subjective    Dane Bloch today has no headache,no chest abdominal pain,no new weakness tingling or numbness, feels much better wants to go home today.     Objective   Blood pressure (!) 99/53, pulse (!) 53, temperature 98.8 F (37.1 C), temperature source Oral, resp. rate 12, height 5' 7"  (1.702 m), weight 115.3 kg (254 lb 3.1 oz), SpO2 98 %.   Intake/Output Summary (Last 24 hours) at 06/14/2017 1142 Last data filed at 06/13/2017 1800  Gross per 24 hour  Intake -  Output 50 ml  Net -50 ml    Exam Awake Alert, Oriented x 3, No new F.N deficits, Normal affect [place].AT,PERRAL Supple Neck,No JVD, No cervical  lymphadenopathy appriciated.  Symmetrical Chest wall movement, Good air movement bilaterally, CTAB RRR,No Gallops,Rubs or new Murmurs, No Parasternal Heave +ve B.Sounds, Abd Soft, Non tender, No organomegaly appriciated, No rebound -guarding or rigidity. No Cyanosis, Clubbing or edema, No new Rash or bruise, R. nephrostomy tube in place.  Minimal surrounding tenderness   Data Review   CBC w Diff:  Lab Results  Component Value Date   WBC 6.4 06/14/2017   HGB 9.0 (L) 06/14/2017   HCT 30.0 (L) 06/14/2017   PLT 310 06/14/2017   LYMPHOPCT 14 12/03/2016   MONOPCT 7 12/03/2016   EOSPCT 3 12/03/2016   BASOPCT 0 12/03/2016    CMP:  Lab Results  Component Value Date   NA 139 06/14/2017   K 4.6 06/14/2017   CL 99 (L) 06/14/2017   CO2 26 06/14/2017   BUN 41 (H) 06/14/2017   CREATININE 10.32 (H) 06/14/2017   PROT 7.5 06/10/2017   ALBUMIN 2.9 (L) 06/14/2017   BILITOT 0.4 06/10/2017   ALKPHOS 51 06/10/2017   AST 14 (L) 06/10/2017   ALT <5 (L) 06/10/2017  .   Total Time in preparing paper work, data evaluation and todays exam - 71 minutes  Lala Lund M.D on 06/14/2017 at 11:42 AM  [facility]   Office  [phone]

## 2017-06-14 NOTE — Progress Notes (Signed)
ANTIBIOTIC CONSULT NOTE  Pharmacy Consult for Merrem Indication: Pyelo  Allergies  Allergen Reactions  . Ciprofloxacin Other (See Comments)    IV only-burning to her veins, can take PO  . Erythromycin Anaphylaxis  . Chlorhexidine Rash  . Other Rash and Other (See Comments)    chloraprep    Patient Measurements: Height: 5\' 7"  (170.2 cm) Weight: 254 lb 3.1 oz (115.3 kg) IBW/kg (Calculated) : 61.6 Adjusted Body Weight:    Vital Signs: Temp: 98.8 F (37.1 C) (06/11 0814) Temp Source: Oral (06/11 0814) BP: (P) 98/52 (06/11 0930) Pulse Rate: (P) 52 (06/11 0930) Intake/Output from previous day: 06/10 0701 - 06/11 0700 In: 240 [P.O.:240] Out: 50 [Urine:50] Intake/Output from this shift: No intake/output data recorded.  Labs: Recent Labs    06/12/17 0259 06/12/17 0300 06/14/17 0841 06/14/17 0842  WBC 6.4  --   --  6.4  HGB 8.2*  --   --  9.0*  PLT 217  --   --  310  CREATININE  --  10.80* 10.32*  --    Estimated Creatinine Clearance: 8.4 mL/min (A) (by C-G formula based on SCr of 10.32 mg/dL (H)). No results for input(s): VANCOTROUGH, VANCOPEAK, VANCORANDOM, GENTTROUGH, GENTPEAK, GENTRANDOM, TOBRATROUGH, TOBRAPEAK, TOBRARND, AMIKACINPEAK, AMIKACINTROU, AMIKACIN in the last 72 hours.   Microbiology:   Medical History: Past Medical History:  Diagnosis Date  . Anemia   . Anxiety   . Cervical cancer (HCC)    Cervical and ovarian  . DVT (deep venous thrombosis) ([place])   . History of kidney stones   . Hypertension    treated years ago, on no medications for at least years ( as of 2019)  . Renal disorder    ESRD - stage 5 Dialysis T/Th/Sa  . Renal insufficiency    Assessment:  ID: meropenem D4 for pyleo. Noted w/ recent pyelonephritis due to ESBL E coli (04/2017) -WBC= WNL, afeb  - Merrem 6/8>> (14d, 6/21)  6/8 blood x2: ngtd 6/8 Urine: ESBL E coli ([place], Zosyn)  Goal of Therapy:  Eradication of infection   Plan:  Merrem 500 mg IV q24h -through  6/21  Ova Meegan S. Alford Highland, PharmD, BCPS Clinical Staff Pharmacist Pager [phone]  Eilene Ghazi Stillinger 06/14/2017,10:12 AM

## 2017-06-14 NOTE — Progress Notes (Signed)
PHARMACY CONSULT NOTE FOR:  OUTPATIENT  PARENTERAL ANTIBIOTIC THERAPY (OPAT)  Indication: Merrem 500mg  IV q 24h (renal dosing) Regimen:  End date: 06/24/2017  IV antibiotic discharge orders are pended. To discharging provider:  please sign these orders via discharge navigator,  Select New Orders & click on the button choice - Manage This Unsigned Work.     Thank you for allowing pharmacy to be a part of this patient's care.  [name] S. Alford Highland, PharmD, BCPS Clinical Staff Pharmacist Pager [phone]  Judy Kelley 06/14/2017, 11:32 AM

## 2017-06-14 NOTE — Progress Notes (Signed)
Per Dr. Candiss Norse, nephrologist Melvia Heaps said okay to give antibiotics via HD cath until PICC is placed tomorrow.

## 2017-06-14 NOTE — Discharge Instructions (Signed)
Follow with Primary MD System, Pcp Not In in 7 days   Get CBC, CMP, 2 view Chest X ray checked  by Primary MD  in 5-7 days   Activity: As tolerated with Full fall precautions use walker/cane & assistance as needed  Disposition    Diet:   Renal  For Heart failure patients - Check your Weight same time everyday, if you gain over 2 pounds, or you develop in leg swelling, experience more shortness of breath or chest pain, call your Primary MD immediately. Follow Cardiac Low Salt Diet and 1.5 lit/day fluid restriction.  Special Instructions: If you have smoked or chewed Tobacco  in the last 2 yrs please stop smoking, stop any regular Alcohol  and or any Recreational drug use.  On your next visit with your primary care physician please Get Medicines reviewed and adjusted.  Please request your Prim.MD to go over all Hospital Tests and Procedure/Radiological results at the follow up, please get all Hospital records sent to your Prim MD by signing hospital release before you go home.  If you experience worsening of your admission symptoms, develop shortness of breath, life threatening emergency, suicidal or homicidal thoughts you must seek medical attention immediately by calling 911 or calling your MD immediately  if symptoms less severe.  You Must read complete instructions/literature along with all the possible adverse reactions/side effects for all the Medicines you take and that have been prescribed to you. Take any new Medicines after you have completely understood and accpet all the possible adverse reactions/side effects.

## 2017-06-14 NOTE — Progress Notes (Signed)
   Patient Status: [facility] - In-pt  Assessment and Plan: Patient in need of venous access.   Tunneled central catheter placement Need for access  ______________________________________________________________________   History of Present Illness: Judy Kelley is a 52 y.o. female   ESRD Unable to get reliable IV access needs IV antibiotics for pyelonephritis - 14 day tx    Allergies and medications reviewed.   Review of Systems: A 12 point ROS discussed and pertinent positives are indicated in the HPI above.  All other systems are negative.    Vital Signs: BP (!) (P) 98/52   Pulse (!) (P) 52   Temp 98.8 F (37.1 C) (Oral)   Resp 12   Ht 5\' 7"  (1.702 m)   Wt 254 lb 3.1 oz (115.3 kg)   SpO2 98%   BMI 39.81 kg/m   Physical Exam  Constitutional: She is oriented to person, place, and time.  Cardiovascular: Normal rate and regular rhythm.  Pulmonary/Chest: Effort normal and breath sounds normal.  Neurological: She is alert and oriented to person, place, and time.  Skin: Skin is warm.  Psychiatric: She has a normal mood and affect. Her behavior is normal.  Vitals reviewed.    Imaging reviewed.   Labs:  COAGS: Recent Labs    12/03/16 1818  12/08/16 0734 03/21/17 0642 03/21/17 1630 04/16/17 2014 04/17/17 0648 04/17/17 1705  INR 1.14  --   --  1.05 0.97  --   --   --   APTT  --    < > 77*  --   --  41* 49* 83*   < > = values in this interval not displayed.    BMP: Recent Labs    04/23/17 0755 06/10/17 2100 06/12/17 0300 06/14/17 0841  NA 139 140 140 139  K 4.1 4.4 4.6 4.6  CL 104 99* 101 99*  CO2 23 26 25 26   GLUCOSE 84 100* 91 75  BUN 31* 36* 49* 41*  CALCIUM 9.2 9.9 8.8* 8.5*  CREATININE 7.55* 8.79* 10.80* 10.32*  GFRNONAA 6* 5* 4* 4*  GFRAA 6* 5* 4* 4*       Electronically Signed: Angelica Wix A, PA-C 06/14/2017, 9:49 AM   I spent a total of 15 minutes in face to face in clinical consultation, greater than 50% of which was  counseling/coordinating care for venous access.

## 2017-06-15 ENCOUNTER — Encounter (HOSPITAL_COMMUNITY): Payer: Self-pay | Admitting: Interventional Radiology

## 2017-06-15 ENCOUNTER — Inpatient Hospital Stay (HOSPITAL_COMMUNITY): Payer: Medicare Other

## 2017-06-15 DIAGNOSIS — Z7901 Long term (current) use of anticoagulants: Secondary | ICD-10-CM

## 2017-06-15 DIAGNOSIS — N186 End stage renal disease: Secondary | ICD-10-CM

## 2017-06-15 HISTORY — PX: IR US GUIDE VASC ACCESS LEFT: IMG2389

## 2017-06-15 HISTORY — PX: IR FLUORO GUIDE CV LINE LEFT: IMG2282

## 2017-06-15 MED ORDER — LIDOCAINE HCL 1 % IJ SOLN
INTRAMUSCULAR | Status: AC
  Filled 2017-06-15: qty 20

## 2017-06-15 MED ORDER — HEPARIN SOD (PORK) LOCK FLUSH 100 UNIT/ML IV SOLN
250.0000 [IU] | INTRAVENOUS | Status: AC | PRN
  Administered 2017-06-15: 250 [IU]

## 2017-06-15 MED ORDER — IOPAMIDOL (ISOVUE-300) INJECTION 61%
INTRAVENOUS | Status: AC
  Administered 2017-06-15: 5 mL
  Filled 2017-06-15: qty 50

## 2017-06-15 MED ORDER — LIDOCAINE HCL (PF) 1 % IJ SOLN
INTRAMUSCULAR | Status: AC | PRN
  Administered 2017-06-15: 10 mL

## 2017-06-15 MED ORDER — APIXABAN 5 MG PO TABS: 5.0000 mg | ORAL_TABLET | Freq: Two times a day (BID) | ORAL | Status: DC

## 2017-06-15 NOTE — Progress Notes (Signed)
Judy Kelley to be D/C'd to [facility] per MD order. Discussed with the patient and all questions fully answered. Report called to [place], LPN. VVS, Skin clean, dry and intact without evidence of skin break down, no evidence of skin tears noted.  Right subclavian catheter and right IJ intact and flushed by IV team. An After Visit Summary was printed and given to the patient.  Patient escorted via WC, and D/C to [facility] via private auto.  [name]  06/15/2017 4:14 PM

## 2017-06-15 NOTE — Clinical Social Work Note (Signed)
Clinical Social Work Assessment  Patient Details  Name: Judy Kelley MRN: [id number] Date of Birth: 11/03/1965  Date of referral:  06/15/17               Reason for consult:  Facility Placement                Permission sought to share information with:  Facility Art therapist granted to share information::  Yes, Verbal Permission Granted  Name::        Agency::  SNFs  Relationship::     Contact Information:     Housing/Transportation Living arrangements for the past 2 months:  Apartment Source of Information:  Patient Patient Interpreter Needed:  None Criminal Activity/Legal Involvement Pertinent to Current Situation/Hospitalization:  No - Comment as needed Significant Relationships:  Church, Friend Lives with:  Friends Do you feel safe going back to the place where you live?  No Need for family participation in patient care:  No (Coment)  Care giving concerns:  CSW received consult for possible SNF placement at time of discharge. CSW spoke with patient regarding request for SNF placement at time of discharge. Patient reported that her roommate and friend of 20 years is not allowing her to stay in the apartment while the patient needs IV antibiotics. She also receives outpatient dialysis, which she normally drives herself to. CSW to continue to follow and assist with discharge planning needs.   Social Worker assessment / plan:  CSW spoke with patient concerning possibility of rehab at [facility] before returning home.  Employment status:  Disabled (Comment on whether or not currently receiving Disability)(Receives Disability) Insurance information:  Medicare PT Recommendations:  Not assessed at this time Information / Referral to community resources:  [place]  Patient/Family's Response to care:  Patient reports that she should be able to return to her roommate's apartment after she completes the IV antibiotics. She has recently been accepted by a  church and is glad to have their support. CSW explained potential barriers of finding a SNF to accept her dialysis time.   Patient/Family's Understanding of and Emotional Response to Diagnosis, Current Treatment, and Prognosis:  Patient/family is realistic regarding therapy needs and expressed being hopeful for SNF placement. Patient expressed understanding of CSW role and discharge process as well as medical condition. No questions/concerns about plan or treatment.    Emotional Assessment Appearance:  Appears stated age Attitude/Demeanor/Rapport:  Gracious Affect (typically observed):  Accepting, Appropriate Orientation:  Oriented to Self, Oriented to Place, Oriented to  Time, Oriented to Situation Alcohol / Substance use:  Not Applicable Psych involvement (Current and /or in the community):  No (Comment)  Discharge Needs  Concerns to be addressed:  Care Coordination Readmission within the last 30 days:  No Current discharge risk:  None Barriers to Discharge:  Homeless with medical needs   Benard Halsted, [place] 06/15/2017, 10:53 AM

## 2017-06-15 NOTE — Progress Notes (Signed)
Oozing blood from insertion site,  Occlusive gauze dressing applied.

## 2017-06-15 NOTE — Progress Notes (Addendum)
  PROGRESS NOTE  Judy Kelley BTD:[id number] DOB: 12-10-1965 DOA: 06/10/2017 PCP: System, Pcp Not In  Brief Narrative: 52 year old woman PMH ESRD, chronic obstructive uropathy, DVT, presented with fever, nausea, abdominal pain, flank pain.  Admitted for abdominal pain, pyelonephritis.  Because of imaging abnormalities of the IVC vascular surgery was consulted, felt clinical significance was minimal, recommended treating pyelonephritis, no further evaluation.  Seen by interventional radiology, recommended no change to nephrostomy tube until as an outpatient 7/3.  Assessment/Plan Right-sided pyelonephritis, in setting of chronic indwelling right-sided nephrostomy tube. --Patient doing well.  Afebrile. --Status post placement of IJ line for antibiotic administration.   -- Plan established, continue meropenem IV every 24 hours with end date 6/21 a.m. remove IJ line at that time.  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  Seen by vascular surgery, no further recommendations.  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   Discharge summary per Dr. Candiss Norse appears complete.  Plan for discharge to skilled nursing facility for short-term rehab.   Murray Hodgkins, MD  [facility] Direct contact: [phone] --Via amion app OR  --[email or web address]; password [email or web address]  7PM-7AM contact night coverage as above 06/15/2017, 3:02 PM  LOS: 4 days   Consultants:  Nephrology  Vascular surgery  Interventional radiology  Procedures:  Placement of left IJ tunneled catheter by interventional radiology 6/12.  Antimicrobials:  Meropenem  Interval history/Subjective: Feels well.  Objective: Vitals:  Vitals:   06/15/17 1033 06/15/17 1423  BP: 116/72 102/65  Pulse: 60 62  Resp: 17 14  Temp: 98.2 F (36.8 C) 98.1 F  (36.7 C)  SpO2: 95% 92%    Exam:  Constitutional:  . Appears calm and comfortable Respiratory:  . CTA bilaterally, no w/r/r.  . Respiratory effort normal.  Cardiovascular:  . RRR, no m/r/g Psychiatric:  . Mental status o Mood, affect appropriate  I have personally reviewed the following:   Labs:  CBC stable, hemoglobin stable  Scheduled Meds: . [START ON 06/16/2017] apixaban  5 mg Oral BID  . calcitRIOL  0.75 mcg Oral Q T,Th,Sa-HD  . cinacalcet  90 mg Oral Q breakfast  . gabapentin  100 mg Oral QHS  . lanthanum  1,000 mg Oral TID WC  . lidocaine      . multivitamin  1 tablet Oral QHS  . sertraline  50 mg Oral QHS   Continuous Infusions: . meropenem (MERREM) IV Stopped (06/15/17 1442)    Principal Problem:   Pyelonephritis Active Problems:   History of ovarian cancer   ESRD (end stage renal disease) (HCC)   Chronic anticoagulation   Abdominal pain   DVT (deep venous thrombosis) (HCC)   Anemia   LOS: 4 days    Time spent review of chart coordination of discharge 40 minutes.

## 2017-06-15 NOTE — NC FL2 (Addendum)
[place] MEDICAID FL2 LEVEL OF CARE SCREENING TOOL     IDENTIFICATION  Patient Name: Judy Kelley Birthdate: 1965/07/18 Sex: female Admission Date (Current Location): 06/10/2017  [facility] and [place] Number:  Herbalist and Address:  [facility], [place] [address] [place], [place] [address]      Provider Number: [id number]  Attending Physician Name and Address:  Samuella Cota, MD  Relative Name and Phone Number:       Current Level of Care: Hospital Recommended Level of Care: [place] Prior Approval Number:    Date Approved/Denied:   PASRR Number: [id number]  Discharge Plan: SNF    Current Diagnoses: Patient Active Problem List   Diagnosis Date Noted  . Abdominal pain 06/11/2017  . Anemia 06/11/2017  . DVT (deep venous thrombosis) ([place])   . UTI (urinary tract infection) 04/16/2017  . Pyelonephritis 04/16/2017  . Chronic anticoagulation 04/16/2017  . History of adverse effect of venous thromboembolism (VTE) prophylaxis 04/16/2017  . Depression 04/16/2017  . Infection and inflammatory reaction due to nephrostomy catheter, initial encounter ([place])   . ESRD (end stage renal disease) ([place]) 03/21/2017  . MRSA bacteremia   . ESRD on dialysis ([place]) 12/04/2016  . Hypokalemia 12/04/2016  . History of ovarian cancer 12/04/2016    Orientation RESPIRATION BLADDER Height & Weight     Self, Time, Situation, Place  Normal Continent Weight: 113.1 kg (249 lb 5.4 oz) Height:  5\' 7"  (170.2 cm)  BEHAVIORAL SYMPTOMS/MOOD NEUROLOGICAL BOWEL NUTRITION STATUS      Continent Diet(Please see DC Summary)  AMBULATORY STATUS COMMUNICATION OF NEEDS Skin   Supervision Verbally Normal                       Personal Care Assistance Level of Assistance  Bathing, Feeding, Dressing Bathing Assistance: Limited assistance Feeding assistance: Independent Dressing Assistance: Limited assistance     Functional Limitations Info   Sight, Hearing, Speech Sight Info: Adequate Hearing Info: Adequate Speech Info: Adequate    SPECIAL CARE FACTORS FREQUENCY                       Contractures      Additional Factors Info  Code Status, Allergies, Psychotropic, Isolation Code Status Info: Full Allergies Info:  Ciprofloxacin, Erythromycin, Chlorhexidine, Other Psychotropic Info: Zoloft  Isolation: MRSA; ESBL       Current Medications (06/15/2017):  This is the current hospital active medication list Current Facility-Administered Medications  Medication Dose Route Frequency Provider Last Rate Last Dose  . acetaminophen (TYLENOL) tablet 1,000 mg  1,000 mg Oral Q6H PRN Radene Gunning, NP      . Derrill Memo ON 06/16/2017] apixaban (ELIQUIS) tablet 5 mg  5 mg Oral BID Monia Sabal, PA-C      . calcitRIOL (ROCALTROL) capsule 0.75 mcg  0.75 mcg Oral Q T,Th,Sa-HD Lynnda Child, PA-C   0.75 mcg at 06/14/17 1301  . cinacalcet (SENSIPAR) tablet 90 mg  90 mg Oral Q breakfast Radene Gunning, NP   90 mg at 06/15/17 0738  . gabapentin (NEURONTIN) capsule 100 mg  100 mg Oral QHS Radene Gunning, NP   100 mg at 06/14/17 2151  . HYDROcodone-acetaminophen (NORCO/VICODIN) 5-325 MG per tablet 1-2 tablet  1-2 tablet Oral Q4H PRN Radene Gunning, NP   2 tablet at 06/15/17 1021  . lanthanum (FOSRENOL) chewable tablet 1,000 mg  1,000 mg Oral TID WC Black, Karen M,  NP   1,000 mg at 06/14/17 1728  . lidocaine (XYLOCAINE) 1 % (with pres) injection           . meropenem (MERREM) 500 mg in sodium chloride 0.9 % 100 mL IVPB  500 mg Intravenous Q24H Karren Cobble, [place]   Stopped at 06/14/17 1808  . multivitamin (RENA-VIT) tablet 1 tablet  1 tablet Oral QHS Lynnda Child, PA-C   1 tablet at 06/14/17 2151  . ondansetron (ZOFRAN) tablet 4 mg  4 mg Oral Q6H PRN Radene Gunning, NP       Or  . ondansetron [facility]) injection 4 mg  4 mg Intravenous Q6H PRN Radene Gunning, NP   4 mg at 06/12/17 0546  . promethazine (PHENERGAN) tablet  25 mg  25 mg Oral Q6H PRN Radene Gunning, NP   25 mg at 06/13/17 1254  . senna-docusate (Senokot-S) tablet 1 tablet  1 tablet Oral QHS PRN Radene Gunning, NP      . sertraline (ZOLOFT) tablet 50 mg  50 mg Oral QHS Radene Gunning, NP   50 mg at 06/14/17 2151     Discharge Medications: Please see discharge summary for a list of discharge medications.  Relevant Imaging Results:  Relevant Lab Results:   Additional Information SSN: [id number]    Needs 2 weeks IV Merrem 500mg  every 24 hrs.   Gets Dialysis T/Th/S 6am at [facility] on [address]  [place], [place]

## 2017-06-15 NOTE — Progress Notes (Signed)
Due to patient requiring a private room, dialysis transport, and IV Merrem, only one facility able to offer, which is [facility]. Will alert patient.  Percell Locus Aneliz Carbary LCSW [phone]

## 2017-06-15 NOTE — Progress Notes (Signed)
[facility] Progress Note   Subjective:  L IJ powerline today to complete IV ABX To SNF for 2wk course Says she plans to find new local living situation as soon as able No other c/o Tol HD yesterday; 2L UF, ppost weigh 112.8kg  Objective Vitals:   06/15/17 0500 06/15/17 0528 06/15/17 0921 06/15/17 1000  BP:  100/66 109/75 118/67  Pulse:  (!) 54 (!) 55 (!) 53  Resp:  17 16 16   Temp:  98.6 F (37 C) 98.3 F (36.8 C) 97.8 F (36.6 C)  TempSrc:  Oral Oral Oral  SpO2:  93% 95% 93%  Weight: 113.1 kg (249 lb 5.4 oz)     Height:       Physical Exam General: Obese female, sitting up in bed NAD  Lungs: CTA bilaterally without wheezes, rales, or rhonchi.  Heart: RRR with S1 S2 Abdomen: soft NT R nephrostomy in place,  Cloudy urine in bag  Lower extremities: Trace LE edema. L>R  Psych:  Pleasant. Normal affect  Dialysis Access:R IJ TDC dsg clean L IJ Powerline in place   Dialysis Orders:  [facility]  4h 180NRe BFR 325/800 EDW 113kg 2K/2Ca  TDC Heparin 4000 U Bolus, 500 U q 31min x3  -Mircera 195mcg IV q 2 weeks (last 6/6) -Calcitriol 0.53mcg PO TIW   Assessment/Plan: 1. R pyelonephritis seen on CT; urine Cx with ESBL E. Coli on meropenem will complete abx at SNF. Chronic R nephrostomy seen by IR to exchange next month 2. ESRD -  TTS. Continue on schedule. Next HD 6/13 as outpatient  3. Hypertension/volume  - BP low/stable. No volume excess on exam. Would lower EDW to 112.5kg at least upon DC 4. Anemia  - Hb down with infection, cont to monitor, on outpt ESA 5. Metabolic bone disease -  Continue Calcitriol/Sensipar/binders  6. Nutrition - Renal vitamins 7. 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; has appt at [facility] next week    Additional Objective Labs: Basic Metabolic Panel: Recent Labs  Lab 06/10/17 2100 06/12/17 0300 06/14/17 0841  NA 140 140 139  K 4.4 4.6 4.6  CL 99* 101 99*  CO2 26 25 26   GLUCOSE 100* 91 75  BUN 36* 49* 41*  CREATININE  8.79* 10.80* 10.32*  CALCIUM 9.9 8.8* 8.5*  PHOS  --  7.5* 6.8*   CBC: Recent Labs  Lab 06/10/17 2100 06/12/17 0259 06/14/17 0842  WBC 9.0 6.4 6.4  HGB 10.1* 8.2* 9.0*  HCT 33.3* 27.6* 30.0*  MCV 98.2 98.2 97.1  PLT 197 217 310   Blood Culture    Component Value Date/Time   SDES BLOOD RIGHT HAND 06/11/2017 0610   SPECREQUEST  06/11/2017 0610    BOTTLES DRAWN AEROBIC AND ANAEROBIC Blood Culture adequate volume   CULT  06/11/2017 0610    NO GROWTH 4 DAYS Performed at [facility] [address], [place], [place] [address]    REPTSTATUS PENDING 06/11/2017 0610    Cardiac Enzymes: Recent Labs  Lab 06/11/17 0515  TROPONINI <0.03   CBG: Recent Labs  Lab 06/12/17 0648  GLUCAP 94   Iron Studies: No results for input(s): IRON, TIBC, TRANSFERRIN, FERRITIN in the last 72 hours. Lab Results  Component Value Date   INR 0.97 03/21/2017   INR 1.05 03/21/2017   INR 1.14 12/03/2016   Medications: . meropenem (MERREM) IV 500 mg (06/15/17 1412)   . [START ON 06/16/2017] apixaban  5 mg Oral BID  . calcitRIOL  0.75 mcg Oral Q T,Th,Sa-HD  . cinacalcet  90 mg Oral Q breakfast  . gabapentin  100 mg Oral QHS  . lanthanum  1,000 mg Oral TID WC  . lidocaine      . multivitamin  1 tablet Oral QHS  . sertraline  50 mg Oral QHS

## 2017-06-15 NOTE — Progress Notes (Signed)
Patient will DC to: [facility] Anticipated DC date: 06/15/17 Family notified: Patient declined and will contact her friend Transport by: Friend by car   Per MD patient ready for DC to [facility]. RN, patient, patient's family, and facility notified of DC. Discharge Summary sent to facility. RN given number for report [phone] Room 108).   CSW signing off.  Cedric Fishman, LCSW Clinical Social Worker [phone]

## 2017-06-15 NOTE — Clinical Social Work Placement (Signed)
   CLINICAL SOCIAL WORK PLACEMENT  NOTE  Date:  06/15/2017  Patient Details  Name: Judy Kelley MRN: [id number] Date of Birth: 1965/02/28  Clinical Social Work is seeking post-discharge placement for this patient at the [place] level of care (*CSW will initial, date and re-position this form in  chart as items are completed):  Yes   Patient/family provided with [place] Work Department's list of facilities offering this level of care within the geographic area requested by the patient (or if unable, by the patient's family).  Yes   Patient/family informed of their freedom to choose among providers that offer the needed level of care, that participate in Medicare, Medicaid or managed care program needed by the patient, have an available bed and are willing to accept the patient.  Yes   Patient/family informed of [facility]'s ownership interest in [facility] and [facility], as well as of the fact that they are under no obligation to receive care at these facilities.  PASRR submitted to EDS on 06/15/17     PASRR number received on 06/15/17     Existing PASRR number confirmed on       FL2 transmitted to all facilities in geographic area requested by pt/family on 06/15/17     FL2 transmitted to all facilities within larger geographic area on       Patient informed that his/her managed care company has contracts with or will negotiate with certain facilities, including the following:        Yes   Patient/family informed of bed offers received.  Patient chooses bed at [facility]     Physician recommends and patient chooses bed at      Patient to be transferred to [facility] on 06/15/17.  Patient to be transferred to facility by Friend by car     Patient family notified on 06/15/17 of transfer.  Name of family member notified:  Patient declined and will alert her friend      PHYSICIAN       Additional Comment:    _______________________________________________ Benard Halsted, [place] 06/15/2017, 3:10 PM

## 2017-06-15 NOTE — Procedures (Signed)
Interventional Radiology Procedure Note  Procedure: Placement of a left IJ approach single lumen tunneled cuffed catheter.  Tip is positioned at the superior cavoatrial junction and catheter is ready for immediate use.  Complications: None Recommendations:  - Ok to use  - Do not submerge  - Routine line care   Signed,  Dulcy Fanny. Earleen Newport, DO

## 2017-06-16 LAB — CULTURE, BLOOD (ROUTINE X 2)
CULTURE: NO GROWTH
Culture: NO GROWTH
Special Requests: ADEQUATE
Special Requests: ADEQUATE

## 2017-06-28 ENCOUNTER — Other Ambulatory Visit (HOSPITAL_COMMUNITY): Payer: Self-pay | Admitting: Family

## 2017-06-28 DIAGNOSIS — Z992 Dependence on renal dialysis: Principal | ICD-10-CM

## 2017-06-28 DIAGNOSIS — N186 End stage renal disease: Secondary | ICD-10-CM

## 2017-06-29 ENCOUNTER — Ambulatory Visit (HOSPITAL_COMMUNITY)
Admission: RE | Admit: 2017-06-29 | Discharge: 2017-06-29 | Disposition: A | Discharge status: Home / Self Care | Payer: Medicare Other | Source: Ambulatory Visit | Attending: Family | Admitting: Family

## 2017-06-29 ENCOUNTER — Encounter (HOSPITAL_COMMUNITY): Payer: Self-pay | Admitting: Student

## 2017-06-29 DIAGNOSIS — Z452 Encounter for adjustment and management of vascular access device: Secondary | ICD-10-CM | POA: Diagnosis present

## 2017-06-29 DIAGNOSIS — N186 End stage renal disease: Secondary | ICD-10-CM

## 2017-06-29 DIAGNOSIS — Z992 Dependence on renal dialysis: Secondary | ICD-10-CM

## 2017-06-29 HISTORY — PX: IR REMOVAL TUN CV CATH W/O FL: IMG2289

## 2017-07-04 DIAGNOSIS — R001 Bradycardia, unspecified: Secondary | ICD-10-CM | POA: Insufficient documentation

## 2017-07-04 DIAGNOSIS — N185 Chronic kidney disease, stage 5: Secondary | ICD-10-CM | POA: Insufficient documentation

## 2017-07-04 DIAGNOSIS — R5383 Other fatigue: Secondary | ICD-10-CM | POA: Insufficient documentation

## 2017-07-04 DIAGNOSIS — Z22322 Carrier or suspected carrier of Methicillin resistant Staphylococcus aureus: Secondary | ICD-10-CM | POA: Insufficient documentation

## 2017-07-04 DIAGNOSIS — E872 Acidosis, unspecified: Secondary | ICD-10-CM | POA: Insufficient documentation

## 2017-07-04 DIAGNOSIS — T83511A Infection and inflammatory reaction due to indwelling urethral catheter, initial encounter: Secondary | ICD-10-CM

## 2017-07-04 DIAGNOSIS — N390 Urinary tract infection, site not specified: Secondary | ICD-10-CM | POA: Insufficient documentation

## 2017-07-04 DIAGNOSIS — I82422 Acute embolism and thrombosis of left iliac vein: Secondary | ICD-10-CM | POA: Insufficient documentation

## 2017-07-05 ENCOUNTER — Encounter: Payer: Self-pay | Admitting: Cardiology

## 2017-07-05 ENCOUNTER — Encounter: Payer: Self-pay | Admitting: *Deleted

## 2017-07-05 ENCOUNTER — Ambulatory Visit (INDEPENDENT_AMBULATORY_CARE_PROVIDER_SITE_OTHER): Payer: Medicare Other | Admitting: Cardiology

## 2017-07-05 VITALS — BP 94/72 | HR 84 | Ht 67.0 in | Wt 248.0 lb

## 2017-07-05 DIAGNOSIS — Z01810 Encounter for preprocedural cardiovascular examination: Secondary | ICD-10-CM | POA: Insufficient documentation

## 2017-07-05 DIAGNOSIS — N186 End stage renal disease: Secondary | ICD-10-CM

## 2017-07-05 NOTE — Patient Instructions (Signed)
Medication Instructions:  Your physician recommends that you continue on your current medications as directed. Please refer to the Current Medication list given to you today.   Labwork: None  Testing/Procedures: You had an EKG today.   Your physician has requested that you have a lexiscan myoview. For further information please visit [email or web address]. Please follow instruction sheet, as given.  Follow-Up: Your physician recommends that you schedule a follow-up appointment in: none needed.   If you need a refill on your cardiac medications before your next appointment, please call your pharmacy.   Thank you for choosing [facility]! Robyne Peers, RN [phone]

## 2017-07-05 NOTE — Progress Notes (Signed)
Cardiology Office Note:    Date:  07/05/2017   ID:  Judy Kelley, DOB 03-Mar-1965, MRN [id number]  PCP:  Patient, No Pcp Per  Cardiologist:  Jenean Lindau, MD   Referring MD: No ref. provider found    ASSESSMENT:    1. ESRD (end stage renal disease) ([place])   2. Pre-operative cardiovascular examination    PLAN:    In order of problems listed above:  1. I discussed my findings with the patient.  Her blood pressure is on the lower side.  But she is just completed dialysis. 2. In view of risk factors I will do a Lexiscan sestamibi.  If this is negative then she has not had high risk for coronary events during the aforementioned surgery.  Meticulous hemodynamic monitoring will further reduce the risk of coronary events. 3. Patient will be seen in follow-up appointment on a as needed basis.   Medication Adjustments/Labs and Tests Ordered: Current medicines are reviewed at length with the patient today.  Concerns regarding medicines are outlined above.  No orders of the defined types were placed in this encounter.  No orders of the defined types were placed in this encounter.    History of Present Illness:    Judy Kelley is a 52 y.o. female who is being seen today for the evaluation of preop cardiovascular assessment.  Patient is a pleasant 52 year old female.  She has had a history of cancer and then subsequent radiation.  She tells me that she had renal damage subsequently it is on dialysis.  She is here for a shunt revision and wants a preop cardiovascular assessment.  She denies any chest pain orthopnea or PND.  She leads a sedentary lifestyle.  She is on dialysis 3 times a week.  At the time of my evaluation, the patient is alert awake oriented and in no distress.  Past Medical History:  Diagnosis Date  . Anemia   . Anxiety   . Cervical cancer (HCC)    Cervical and ovarian  . DVT (deep venous thrombosis) ([place])   . History of kidney stones   . Hypertension    treated  years ago, on no medications for at least years ( as of 2019)  . Renal disorder    ESRD - stage 5 Dialysis T/Th/Sa  . Renal insufficiency     Past Surgical History:  Procedure Laterality Date  . AV FISTULA PLACEMENT Right 03/21/2017   Procedure: INSERTION OF ARTERIOVENOUS (AV) GORE-TEX GRAFT RIGHT THIGH;  Surgeon: Angelia Mould, MD;  Location: [facility];  Service: Vascular;  Laterality: Right;  . CYSTOSCOPY N/A 12/09/2016   Procedure: ENDOSCOPY OF ILEAL CONDUIT;  Surgeon: Irine Seal, MD;  Location: [facility];  Service: Urology;  Laterality: N/A;  . GRAFT APPLICATION Right 84/69/6295   INSERTION OF ARTERIOVENOUS (AV) GORE-TEX GRAFT RIGHT THIGH   . ileal conduit    . IR FLUORO GUIDE CV LINE LEFT  06/15/2017  . IR FLUORO GUIDE CV LINE RIGHT  12/09/2016  . IR FLUORO GUIDE CV LINE RIGHT  06/07/2017  . IR NEPHROSTOMY EXCHANGE RIGHT  01/21/2017  . IR NEPHROSTOMY EXCHANGE RIGHT  04/13/2017  . IR PTA VENOUS EXCEPT DIALYSIS CIRCUIT  06/07/2017  . IR REMOVAL TUN CV CATH W/O FL  06/29/2017  . IR THROMBECTOMY AV FISTULA W/THROMBOLYSIS/PTA/STENT INC/SHUNT/IMG RT Right 12/09/2016  . IR US GUIDE VASC ACCESS LEFT  06/15/2017  . IR US GUIDE VASC ACCESS RIGHT  12/09/2016  . IR US GUIDE VASC ACCESS RIGHT  12/09/2016  . IR VENOCAVAGRAM IVC  06/07/2017  . nephrosotomy    . TONSILLECTOMY    . TUBAL LIGATION      Current Medications: Current Meds  Medication Sig  . acetaminophen (TYLENOL) 500 MG tablet Take 1,000 mg by mouth every 6 (six) hours as needed for mild pain or headache.   Marland Kitchen apixaban (ELIQUIS) 5 MG TABS tablet Take 5 mg by mouth 2 (two) times daily.  . cinacalcet (SENSIPAR) 90 MG tablet Take 90 mg by mouth daily.  Marland Kitchen gabapentin (NEURONTIN) 100 MG capsule Take 100 mg by mouth at bedtime.  Marland Kitchen lanthanum (FOSRENOL) 1000 MG chewable tablet Chew 1 tablet (1,000 mg total) by mouth 3 (three) times daily with meals.  Marland Kitchen loperamide (IMODIUM) 2 MG capsule Take 1 capsule (2 mg total) by mouth as needed for diarrhea or  loose stools.  . multivitamin (RENA-VIT) TABS tablet Take 1 tablet by mouth daily.  . mupirocin ointment (BACTROBAN) 2 % Apply 1 application topically 2 (two) times daily as needed (rash).   . promethazine (PHENERGAN) 25 MG tablet Take 25 mg by mouth every 6 (six) hours as needed for nausea or vomiting.  . sertraline (ZOLOFT) 50 MG tablet Take 50 mg by mouth at bedtime.      Allergies:   Ciprofloxacin; Erythromycin; Chlorhexidine; and Other   Social History   Socioeconomic History  . Marital status: Single    Spouse name: Not on file  . Number of children: Not on file  . Years of education: Not on file  . Highest education level: Not on file  Occupational History  . Not on file  Social Needs  . Financial resource strain: Not on file  . Food insecurity:    Worry: Not on file    Inability: Not on file  . Transportation needs:    Medical: Not on file    Non-medical: Not on file  Tobacco Use  . Smoking status: Never Smoker  . Smokeless tobacco: Never Used  Substance and Sexual Activity  . Alcohol use: Yes    Frequency: Never    Comment: occasional wine  . Drug use: No  . Sexual activity: Not on file  Lifestyle  . Physical activity:    Days per week: Not on file    Minutes per session: Not on file  . Stress: Not on file  Relationships  . Social connections:    Talks on phone: Not on file    Gets together: Not on file    Attends religious service: Not on file    Active member of club or organization: Not on file    Attends meetings of clubs or organizations: Not on file    Relationship status: Not on file  Other Topics Concern  . Not on file  Social History Narrative  . Not on file     Family History: The patient's family history includes COPD in her mother; Dementia in her father; Diabetes Mellitus II in her father and mother; Heart disease in her father and mother; Kidney disease in her mother; Ovarian cancer in her maternal aunt; Rheum arthritis in her  mother.  ROS:   Please see the history of present illness.    All other systems reviewed and are negative.  EKGs/Labs/Other Studies Reviewed:    The following studies were reviewed today: EKG reveals sinus rhythm and nonspecific ST-T changes.   Recent Labs: 12/05/2016: Magnesium 2.3 06/10/2017: ALT <5 06/14/2017: BUN 41; Creatinine, Ser 10.32; Hemoglobin 9.0; Platelets 310;  Potassium 4.6; Sodium 139  Recent Lipid Panel No results found for: CHOL, TRIG, HDL, CHOLHDL, VLDL, LDLCALC, LDLDIRECT  Physical Exam:    VS:  Pulse 84   Ht 5\' 7"  (1.702 m)   Wt 248 lb (112.5 kg)   SpO2 97%   BMI 38.84 kg/m     Wt Readings from Last 3 Encounters:  07/05/17 248 lb (112.5 kg)  06/15/17 249 lb 5.4 oz (113.1 kg)  04/23/17 247 lb 5.7 oz (112.2 kg)     GEN: Patient is in no acute distress HEENT: Normal NECK: No JVD; No carotid bruits LYMPHATICS: No lymphadenopathy CARDIAC: S1 S2 regular, 2/6 systolic murmur at the apex. RESPIRATORY:  Clear to auscultation without rales, wheezing or rhonchi  ABDOMEN: Soft, non-tender, non-distended MUSCULOSKELETAL:  No edema; No deformity  SKIN: Warm and dry NEUROLOGIC:  Alert and oriented x 3 PSYCHIATRIC:  Normal affect    Signed, Jenean Lindau, MD  07/05/2017 5:31 PM    [facility]

## 2017-07-06 ENCOUNTER — Encounter: Payer: Self-pay | Admitting: Cardiology

## 2017-07-06 ENCOUNTER — Other Ambulatory Visit (HOSPITAL_COMMUNITY): Payer: Self-pay | Admitting: Interventional Radiology

## 2017-07-06 ENCOUNTER — Ambulatory Visit (HOSPITAL_COMMUNITY)
Admission: RE | Admit: 2017-07-06 | Discharge: 2017-07-06 | Disposition: A | Discharge status: Home / Self Care | Payer: Medicare Other | Source: Ambulatory Visit | Attending: Interventional Radiology | Admitting: Interventional Radiology

## 2017-07-06 ENCOUNTER — Encounter (HOSPITAL_COMMUNITY): Payer: Self-pay | Admitting: Interventional Radiology

## 2017-07-06 DIAGNOSIS — Z8551 Personal history of malignant neoplasm of bladder: Secondary | ICD-10-CM

## 2017-07-06 DIAGNOSIS — R310 Gross hematuria: Secondary | ICD-10-CM

## 2017-07-06 DIAGNOSIS — Z436 Encounter for attention to other artificial openings of urinary tract: Secondary | ICD-10-CM | POA: Insufficient documentation

## 2017-07-06 HISTORY — PX: IR NEPHROSTOMY EXCHANGE RIGHT: IMG6070

## 2017-07-06 MED ORDER — IOPAMIDOL (ISOVUE-300) INJECTION 61%
INTRAVENOUS | Status: AC
  Administered 2017-07-06: 10 mL
  Filled 2017-07-06: qty 50

## 2017-07-06 MED ORDER — IOPAMIDOL (ISOVUE-300) INJECTION 61%
50.0000 mL | Freq: Once | INTRAVENOUS | Status: AC | PRN
  Administered 2017-07-06: 10 mL

## 2017-07-21 ENCOUNTER — Telehealth (HOSPITAL_COMMUNITY): Payer: Self-pay | Admitting: *Deleted

## 2017-07-21 ENCOUNTER — Telehealth: Payer: Self-pay

## 2017-07-21 NOTE — Telephone Encounter (Signed)
SENT REFERRAL TO SCHEDULING AND FILED NOTES 

## 2017-07-21 NOTE — Telephone Encounter (Signed)
Patient given detailed instructions per Myocardial Perfusion Study Information Sheet for the test on 07/25/17. Patient notified to arrive 15 minutes early and that it is imperative to arrive on time for appointment to keep from having the test rescheduled.  If you need to cancel or reschedule your appointment, please call the office within 24 hours of your appointment. . Patient verbalized understanding. Carlyon Nolasco Jacqueline    

## 2017-07-21 NOTE — Telephone Encounter (Signed)
   [facility] Pre-operative Risk Assessment    Request for surgical clearance:  1. What type of surgery is being performed? Permanent access placement for dialysis   2. When is this surgery scheduled? 08/19/2017   3. What type of clearance is required (medical clearance vs. Pharmacy clearance to hold med vs. Both)? Both     4. Are there any medications that need to be held prior to surgery and how long? Pt is on Eliquis   5. Practice name and name of physician performing surgery? Dr. Danne Baxter at [facility]     6. What is your office phone number - Request received from [facility]    7.   What is your office fax number  [phone]  8.   Anesthesia type (None, local, MAC, general) ? Not specified   Judy Kelley 07/21/2017, 9:15 AM  _________________________________________________________________   (provider comments below)

## 2017-07-21 NOTE — Telephone Encounter (Signed)
Pt has seen cardiologist and awaiting clearance then will follow with Eliquis instructions.

## 2017-07-25 ENCOUNTER — Ambulatory Visit (HOSPITAL_COMMUNITY): Payer: Medicare Other | Attending: Internal Medicine

## 2017-07-25 VITALS — Ht 67.0 in | Wt 248.0 lb

## 2017-07-25 DIAGNOSIS — Z01810 Encounter for preprocedural cardiovascular examination: Secondary | ICD-10-CM | POA: Diagnosis not present

## 2017-07-25 MED ORDER — REGADENOSON 0.4 MG/5ML IV SOLN
0.4000 mg | Freq: Once | INTRAVENOUS | Status: AC
  Administered 2017-07-25: 0.4 mg via INTRAVENOUS

## 2017-07-25 MED ORDER — TECHNETIUM TC 99M TETROFOSMIN IV KIT
31.3000 | PACK | Freq: Once | INTRAVENOUS | Status: AC | PRN
  Administered 2017-07-25: 31.3 via INTRAVENOUS
  Filled 2017-07-25: qty 32

## 2017-07-25 NOTE — Telephone Encounter (Signed)
Will route to CVRR for recommendations regarding anticoagulation. Richardson Dopp, PA-C    07/25/2017 4:50 PM

## 2017-07-26 ENCOUNTER — Ambulatory Visit (HOSPITAL_COMMUNITY): Payer: Medicare Other | Attending: Cardiology

## 2017-07-26 LAB — MYOCARDIAL PERFUSION IMAGING
CHL CUP NUCLEAR SDS: 9
CHL CUP RESTING HR STRESS: 65 {beats}/min
LV sys vol: 41 mL
LVDIAVOL: 104 mL (ref 46–106)
NUC STRESS TID: 0.88
Peak HR: 86 {beats}/min
RATE: 0.4
SRS: 0
SSS: 9

## 2017-07-26 MED ORDER — TECHNETIUM TC 99M TETROFOSMIN IV KIT
31.2000 | PACK | Freq: Once | INTRAVENOUS | Status: AC | PRN
  Administered 2017-07-26: 31.2 via INTRAVENOUS
  Filled 2017-07-26: qty 32

## 2017-07-26 NOTE — Telephone Encounter (Signed)
Patient with diagnosis of DVT on Eliquis for anticoagulation.    Procedure: dialysis access Date of procedure: 08/19/17  It does not appear that we are managing her anticoagulation due to VTE indication. Will defer to managing physician.

## 2017-07-26 NOTE — Telephone Encounter (Signed)
Patient seen by Dr. Geraldo Pitter 07/05/17 for surgical clearance. Eliquis is managed by another provider for hx of DVT.    I will forward phone note to Dr. Geraldo Pitter to request he send final recommendations regarding surgical risk to the requesting surgeon.  Call back staff: Please contact requesting surgeon.  Please notify the provider that recommendations for holding Eliquis will need to be made by the provider that manages this drug.    Richardson Dopp, PA-C    07/26/2017 3:26 PM

## 2017-07-27 ENCOUNTER — Telehealth: Payer: Self-pay | Admitting: Cardiology

## 2017-07-27 NOTE — Telephone Encounter (Signed)
Left message to return call 

## 2017-07-27 NOTE — Telephone Encounter (Signed)
Patient is returning call about results. 

## 2017-07-27 NOTE — Telephone Encounter (Signed)
The patient will need to be seen by Jenean Lindau, MD for further evaluation.  His RN is calling to arrange.  Call back staff: Please contact requesting surgeon to notify of need for further evaluation by Dr. Geraldo Pitter.  Further recommendations will come from him.  Also notify requesting surgeon that recommendations for Eliquis will need to come from the provider that manages this drug.  It is not a drug managed by Cardiology.  This note will be removed from the preop pool.  Richardson Dopp, PA-C    07/27/2017 2:57 PM

## 2017-07-27 NOTE — Telephone Encounter (Signed)
I s/w Dr. Danne Baxter at [facility] office and informed them the pt will need to see Dr. Geraldo Pitter for clearance. Advised clearance will be coming from Dr. Geraldo Pitter. I did also advise they will need to contact the provider who is managing the pt's Eliquis, which is not being managed by Cardiology. Dr. Danne Baxter office did also want to let us know that the pt will begin being seen I their [place] location. I will send this note to Dr. Geraldo Pitter for his appt with the pt.

## 2017-07-28 ENCOUNTER — Ambulatory Visit (HOSPITAL_COMMUNITY)
Admission: RE | Admit: 2017-07-28 | Discharge: 2017-07-28 | Disposition: A | Discharge status: Home / Self Care | Payer: Medicare Other | Source: Ambulatory Visit | Attending: Interventional Radiology | Admitting: Interventional Radiology

## 2017-07-28 ENCOUNTER — Encounter (HOSPITAL_COMMUNITY): Payer: Self-pay | Admitting: Interventional Radiology

## 2017-07-28 ENCOUNTER — Other Ambulatory Visit (HOSPITAL_COMMUNITY): Payer: Self-pay | Admitting: Interventional Radiology

## 2017-07-28 DIAGNOSIS — Z436 Encounter for attention to other artificial openings of urinary tract: Secondary | ICD-10-CM | POA: Insufficient documentation

## 2017-07-28 DIAGNOSIS — Z8551 Personal history of malignant neoplasm of bladder: Secondary | ICD-10-CM

## 2017-07-28 DIAGNOSIS — R310 Gross hematuria: Secondary | ICD-10-CM

## 2017-07-28 HISTORY — PX: IR NEPHROSTOMY EXCHANGE RIGHT: IMG6070

## 2017-07-28 MED ORDER — IOPAMIDOL (ISOVUE-300) INJECTION 61%
INTRAVENOUS | Status: AC
  Administered 2017-07-28: 10 mL
  Filled 2017-07-28: qty 50

## 2017-07-28 NOTE — Telephone Encounter (Signed)
Patient has been scheduled for a follow up appointment on 08/01/17 at 1:20 pm to discuss stress test results.

## 2017-08-01 ENCOUNTER — Encounter: Payer: Self-pay | Admitting: Cardiology

## 2017-08-01 ENCOUNTER — Telehealth: Payer: Self-pay

## 2017-08-01 ENCOUNTER — Ambulatory Visit (INDEPENDENT_AMBULATORY_CARE_PROVIDER_SITE_OTHER): Payer: Medicare Other | Admitting: Cardiology

## 2017-08-01 VITALS — BP 120/70 | HR 69 | Ht 67.0 in | Wt 274.8 lb

## 2017-08-01 DIAGNOSIS — I82401 Acute embolism and thrombosis of unspecified deep veins of right lower extremity: Secondary | ICD-10-CM | POA: Diagnosis not present

## 2017-08-01 DIAGNOSIS — Z01810 Encounter for preprocedural cardiovascular examination: Secondary | ICD-10-CM | POA: Diagnosis not present

## 2017-08-01 DIAGNOSIS — N186 End stage renal disease: Secondary | ICD-10-CM | POA: Diagnosis not present

## 2017-08-01 DIAGNOSIS — Z992 Dependence on renal dialysis: Secondary | ICD-10-CM

## 2017-08-01 NOTE — Telephone Encounter (Signed)
Pharmacy-I could not figure out if we were following Eliquis or who is.  Pt cleared medically just need info on anticoagulation.

## 2017-08-01 NOTE — Progress Notes (Signed)
Cardiology Office Note:    Date:  08/01/2017   ID:  Jannet Askew, DOB January 20, 1965, MRN [id number]  PCP:  Patient, No Pcp Per  Cardiologist:  Jenean Lindau, MD   Referring MD: No ref. provider found    ASSESSMENT:    1. ESRD on dialysis ([place])   2. Deep vein thrombosis (DVT) of right lower extremity, unspecified chronicity, unspecified vein (HCC)   3. Pre-operative cardiovascular examination    PLAN:    In order of problems listed above:  1. Primary prevention stressed with the patient.  Importance of compliance with diet and medications stressed and she vocalized understanding.  Her blood pressure is stable. 2. Her stress test is unremarkable and at best very mildly positive.  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  She has a good effort tolerance.  In view of this I discussed with her the risk stratification philosophy.  Results of the test were discussed.  I gave her an option of going the invasive route for further evaluation of this finding and she is not keen on it.  I concur with her and in view of her good effort tolerance and low risk scan, she is good to proceed with the surgery.  Meticulous hemodynamic monitoring will further reduce the risk of coronary events.  Her anticoagulation will be managed by her nephrologist and primary care physician as it is for history of DVT.  I do not wish to monitor that as I will be seeing her very sporadically in follow-up. 3. Patient will be seen in follow-up appointment in 6 months or earlier if the patient has any concerns.  She was advised to walk 15 minutes at a stretch twice a day.    Medication Adjustments/Labs and Tests Ordered: Current medicines are reviewed at length with the patient today.  Concerns regarding medicines are outlined above.  No orders of the defined types were placed in this encounter.  No orders of the defined types were placed in this encounter.    Chief Complaint  Patient  presents with  . Follow up on Stress Test     History of Present Illness:    Gibson Telleria is a 52 y.o. female.  Patient has been evaluated by me for preoperative stratification for fistula surgery.  She has end-stage renal disease on dialysis.  She mentions to me that she is on Eliquis because of history of DVT.  She denies any chest pain orthopnea or PND.  Her stress test was overall low risk scan.  She tells me that she now walks 15 minutes at least a day without any problems.  No chest pain orthopnea PND or any symptoms suggesting angina.  At the time of my evaluation, the patient is alert awake oriented and in no distress.  Past Medical History:  Diagnosis Date  . Anemia   . Anxiety   . Cervical cancer (HCC)    Cervical and ovarian  . DVT (deep venous thrombosis) ([place])   . History of kidney stones   . Hypertension    treated years ago, on no medications for at least years ( as of 2019)  . Renal disorder    ESRD - stage 5 Dialysis T/Th/Sa  . Renal insufficiency     Past Surgical History:  Procedure Laterality Date  . AV FISTULA PLACEMENT Right 03/21/2017   Procedure: INSERTION OF ARTERIOVENOUS (AV) GORE-TEX GRAFT RIGHT THIGH;  Surgeon: Angelia Mould, MD;  Location: [place] [facility];  Service: Vascular;  Laterality: Right;  . CYSTOSCOPY N/A 12/09/2016   Procedure: ENDOSCOPY OF ILEAL CONDUIT;  Surgeon: Irine Seal, MD;  Location: [facility];  Service: Urology;  Laterality: N/A;  . GRAFT APPLICATION Right 82/42/3536   INSERTION OF ARTERIOVENOUS (AV) GORE-TEX GRAFT RIGHT THIGH   . ileal conduit    . IR FLUORO GUIDE CV LINE LEFT  06/15/2017  . IR FLUORO GUIDE CV LINE RIGHT  12/09/2016  . IR FLUORO GUIDE CV LINE RIGHT  06/07/2017  . IR NEPHROSTOMY EXCHANGE RIGHT  01/21/2017  . IR NEPHROSTOMY EXCHANGE RIGHT  04/13/2017  . IR NEPHROSTOMY EXCHANGE RIGHT  07/06/2017  . IR NEPHROSTOMY EXCHANGE RIGHT  07/28/2017  . IR PTA VENOUS EXCEPT DIALYSIS CIRCUIT  06/07/2017  . IR REMOVAL TUN CV CATH W/O FL   06/29/2017  . IR THROMBECTOMY AV FISTULA W/THROMBOLYSIS/PTA/STENT INC/SHUNT/IMG RT Right 12/09/2016  . IR US GUIDE VASC ACCESS LEFT  06/15/2017  . IR US GUIDE VASC ACCESS RIGHT  12/09/2016  . IR US GUIDE VASC ACCESS RIGHT  12/09/2016  . IR VENOCAVAGRAM IVC  06/07/2017  . nephrosotomy    . TONSILLECTOMY    . TUBAL LIGATION      Current Medications: Current Meds  Medication Sig  . acetaminophen (TYLENOL) 500 MG tablet Take 1,000 mg by mouth every 6 (six) hours as needed for mild pain or headache.   Marland Kitchen apixaban (ELIQUIS) 5 MG TABS tablet Take 5 mg by mouth 2 (two) times daily.  . cinacalcet (SENSIPAR) 90 MG tablet Take 90 mg by mouth daily.  Marland Kitchen gabapentin (NEURONTIN) 100 MG capsule Take 100 mg by mouth at bedtime.  Marland Kitchen lanthanum (FOSRENOL) 1000 MG chewable tablet Chew 1 tablet (1,000 mg total) by mouth 3 (three) times daily with meals.  Marland Kitchen loperamide (IMODIUM) 2 MG capsule Take 1 capsule (2 mg total) by mouth as needed for diarrhea or loose stools.  . multivitamin (RENA-VIT) TABS tablet Take 1 tablet by mouth daily.  . mupirocin ointment (BACTROBAN) 2 % Apply 1 application topically 2 (two) times daily as needed (rash).   . promethazine (PHENERGAN) 25 MG tablet Take 25 mg by mouth every 6 (six) hours as needed for nausea or vomiting.  . sertraline (ZOLOFT) 50 MG tablet Take 50 mg by mouth at bedtime.      Allergies:   Ciprofloxacin; Erythromycin; Chlorhexidine; and Other   Social History   Socioeconomic History  . Marital status: Single    Spouse name: Not on file  . Number of children: Not on file  . Years of education: Not on file  . Highest education level: Not on file  Occupational History  . Not on file  Social Needs  . Financial resource strain: Not on file  . Food insecurity:    Worry: Not on file    Inability: Not on file  . Transportation needs:    Medical: Not on file    Non-medical: Not on file  Tobacco Use  . Smoking status: Never Smoker  . Smokeless tobacco: Never Used    Substance and Sexual Activity  . Alcohol use: Yes    Frequency: Never    Comment: occasional wine  . Drug use: No  . Sexual activity: Not on file  Lifestyle  . Physical activity:    Days per week: Not on file    Minutes per session: Not on file  . Stress: Not on file  Relationships  . Social connections:    Talks on phone: Not on file  Gets together: Not on file    Attends religious service: Not on file    Active member of club or organization: Not on file    Attends meetings of clubs or organizations: Not on file    Relationship status: Not on file  Other Topics Concern  . Not on file  Social History Narrative  . Not on file     Family History: The patient's family history includes COPD in her mother; Dementia in her father; Diabetes Mellitus II in her father and mother; Heart disease in her father and mother; Kidney disease in her mother; Ovarian cancer in her maternal aunt; Rheum arthritis in her mother.  ROS:   Please see the history of present illness.    All other systems reviewed and are negative.  EKGs/Labs/Other Studies Reviewed:    The following studies were reviewed today: I discussed the results of the findings with the patient at length   Recent Labs: 12/05/2016: Magnesium 2.3 06/10/2017: ALT <5 06/14/2017: BUN 41; Creatinine, Ser 10.32; Hemoglobin 9.0; Platelets 310; Potassium 4.6; Sodium 139  Recent Lipid Panel No results found for: CHOL, TRIG, HDL, CHOLHDL, VLDL, LDLCALC, LDLDIRECT  Physical Exam:    VS:  BP 120/70   Pulse 69   Ht 5\' 7"  (1.702 m)   Wt 274 lb 12.8 oz (124.6 kg)   SpO2 98%   BMI 43.04 kg/m     Wt Readings from Last 3 Encounters:  08/01/17 274 lb 12.8 oz (124.6 kg)  07/25/17 248 lb (112.5 kg)  07/05/17 248 lb (112.5 kg)     GEN: Patient is in no acute distress HEENT: Normal NECK: No JVD; No carotid bruits LYMPHATICS: No lymphadenopathy CARDIAC: Hear sounds regular, 2/6 systolic murmur at the apex. RESPIRATORY:  Clear to  auscultation without rales, wheezing or rhonchi  ABDOMEN: Soft, non-tender, non-distended MUSCULOSKELETAL:  No edema; No deformity  SKIN: Warm and dry NEUROLOGIC:  Alert and oriented x 3 PSYCHIATRIC:  Normal affect   Signed, Jenean Lindau, MD  08/01/2017 1:54 PM    [facility]

## 2017-08-01 NOTE — Patient Instructions (Signed)
Medication Instructions:  Your physician recommends that you continue on your current medications as directed. Please refer to the Current Medication list given to you today.   Labwork: None  Testing/Procedures: None  Follow-Up: Your physician wants you to follow-up in: 6 months. You will receive a reminder letter in the mail two months in advance. If you don't receive a letter, please call our office to schedule the follow-up appointment.   If you need a refill on your cardiac medications before your next appointment, please call your pharmacy.   Thank you for choosing [facility]! Ailed Defibaugh, RN [phone]    

## 2017-08-01 NOTE — Telephone Encounter (Signed)
   [facility] Pre-operative Risk Assessment    Request for surgical clearance:  1. What type of surgery is being performed? Right arm dialysis access  2. When is this surgery scheduled? 08/20/18  3. What type of clearance is required (medical clearance vs. Pharmacy clearance to hold med vs. Both)? Both  4. Are there any medications that need to be held prior to surgery and how long? Eliquis  5. Practice name and name of physician performing surgery? [facility] Dr Arbie Cookey   6. What is your office phone number [phone]   7.   What is your office fax number [phone]  8.   Anesthesia type (None, local, MAC, general) ? Regional nerve block   Shella Maxim Suits 08/01/2017, 10:51 AM  _________________________________________________________________   (provider comments below)

## 2017-08-05 NOTE — Telephone Encounter (Signed)
FYI to pharmacy - no input needed. 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

## 2017-08-05 NOTE — Telephone Encounter (Signed)
   Primary Cardiologist:Rajan R Revankar, MD  Chart reviewed as part of pre-operative protocol coverage. Pre-op clearance already addressed by colleagues in office note. To summarize recommendations:  - Dr. Lennox Pippins states in most recent office note 08/01/17, "Her stress test is unremarkable and at best very mildly positive.  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  She has a good effort tolerance.  In view of this I discussed with her the risk stratification philosophy.  Results of the test were discussed.  I gave her an option of going the invasive route for further evaluation of this finding and she is not keen on it.  I concur with her and in view of her good effort tolerance and low risk scan, she is good to proceed with the surgery.  Meticulous hemodynamic monitoring will further reduce the risk of coronary events.  Her anticoagulation will be managed by her nephrologist and primary care physician as it is for history of DVT.  I do not wish to monitor that as I will be seeing her very sporadically in follow-up."  Therefore, we cannot provide recommendations about Eliquis at this time as we are not managing this or prescribing this for cardiac reasons. Would recommend surgical team reach out to nephrology or primary care for further input.  Will route this bundled recommendation to requesting provider via Epic fax function. Please call with questions.  Charlie Pitter, PA-C 08/05/2017, 8:42 AM

## 2017-08-06 ENCOUNTER — Other Ambulatory Visit
Admission: RE | Admit: 2017-08-06 | Discharge: 2017-08-06 | Disposition: A | Discharge status: Home / Self Care | Payer: Medicare Other | Source: Ambulatory Visit | Attending: Nephrology | Admitting: Nephrology

## 2017-08-06 ENCOUNTER — Other Ambulatory Visit: Payer: Self-pay | Admitting: Nephrology

## 2017-08-06 DIAGNOSIS — E875 Hyperkalemia: Secondary | ICD-10-CM

## 2017-08-06 DIAGNOSIS — N186 End stage renal disease: Secondary | ICD-10-CM | POA: Diagnosis present

## 2017-08-06 LAB — POTASSIUM: Potassium: 4.5 mmol/L (ref 3.5–5.1)

## 2017-08-25 MED ORDER — OXYCODONE HCL 5 MG PO TABS
5.00 | ORAL_TABLET | ORAL | Status: DC
Start: ? — End: 2017-08-25

## 2017-08-25 MED ORDER — SERTRALINE HCL 50 MG PO TABS
50.00 | ORAL_TABLET | ORAL | Status: DC
Start: 2017-08-25 — End: 2017-08-25

## 2017-08-25 MED ORDER — LANTHANUM CARBONATE 500 MG PO CHEW
1000.00 | CHEWABLE_TABLET | ORAL | Status: DC
Start: 2017-08-25 — End: 2017-08-25

## 2017-08-25 MED ORDER — GABAPENTIN 100 MG PO CAPS
100.00 | ORAL_CAPSULE | ORAL | Status: DC
Start: 2017-08-25 — End: 2017-08-25

## 2017-08-25 MED ORDER — CINACALCET HCL 30 MG PO TABS
90.00 | ORAL_TABLET | ORAL | Status: DC
Start: 2017-08-26 — End: 2017-08-25

## 2017-08-25 MED ORDER — SENNOSIDES-DOCUSATE SODIUM 8.6-50 MG PO TABS
2.00 | ORAL_TABLET | ORAL | Status: DC
Start: ? — End: 2017-08-25

## 2017-08-25 MED ORDER — DIPHENHYDRAMINE HCL 25 MG PO CAPS
25.00 | ORAL_CAPSULE | ORAL | Status: DC
Start: ? — End: 2017-08-25

## 2017-08-25 MED ORDER — LOPERAMIDE HCL 2 MG PO CAPS
2.00 | ORAL_CAPSULE | ORAL | Status: DC
Start: ? — End: 2017-08-25

## 2017-08-25 MED ORDER — RENA-VITE PO TABS
1.00 | ORAL_TABLET | ORAL | Status: DC
Start: 2017-08-26 — End: 2017-08-25

## 2017-08-25 MED ORDER — PROMETHAZINE HCL 25 MG PO TABS
25.00 | ORAL_TABLET | ORAL | Status: DC
Start: ? — End: 2017-08-25

## 2017-08-25 MED ORDER — ACETAMINOPHEN 500 MG PO TABS
500.00 | ORAL_TABLET | ORAL | Status: DC
Start: ? — End: 2017-08-25

## 2017-08-25 MED ORDER — LIDOCAINE HCL 1 % IJ SOLN
0.50 | INTRAMUSCULAR | Status: DC
Start: ? — End: 2017-08-25

## 2017-09-03 ENCOUNTER — Emergency Department
Admission: EM | Admit: 2017-09-03 | Discharge: 2017-09-03 | Disposition: A | Discharge status: Home / Self Care | Payer: Medicare Other | Attending: Emergency Medicine | Admitting: Emergency Medicine

## 2017-09-03 ENCOUNTER — Emergency Department: Payer: Medicare Other

## 2017-09-03 DIAGNOSIS — Z992 Dependence on renal dialysis: Secondary | ICD-10-CM | POA: Insufficient documentation

## 2017-09-03 DIAGNOSIS — Z7901 Long term (current) use of anticoagulants: Secondary | ICD-10-CM | POA: Insufficient documentation

## 2017-09-03 DIAGNOSIS — R202 Paresthesia of skin: Secondary | ICD-10-CM | POA: Insufficient documentation

## 2017-09-03 DIAGNOSIS — I120 Hypertensive chronic kidney disease with stage 5 chronic kidney disease or end stage renal disease: Secondary | ICD-10-CM | POA: Diagnosis not present

## 2017-09-03 DIAGNOSIS — B354 Tinea corporis: Secondary | ICD-10-CM | POA: Insufficient documentation

## 2017-09-03 DIAGNOSIS — Z79899 Other long term (current) drug therapy: Secondary | ICD-10-CM | POA: Insufficient documentation

## 2017-09-03 DIAGNOSIS — R531 Weakness: Secondary | ICD-10-CM | POA: Insufficient documentation

## 2017-09-03 DIAGNOSIS — N186 End stage renal disease: Secondary | ICD-10-CM | POA: Diagnosis not present

## 2017-09-03 DIAGNOSIS — Z86718 Personal history of other venous thrombosis and embolism: Secondary | ICD-10-CM | POA: Diagnosis not present

## 2017-09-03 DIAGNOSIS — R5381 Other malaise: Secondary | ICD-10-CM | POA: Diagnosis present

## 2017-09-03 LAB — CBC WITH DIFFERENTIAL/PLATELET
BASOS ABS: 0.1 10*3/uL (ref 0–0.1)
BASOS PCT: 2 %
Eosinophils Absolute: 0.4 10*3/uL (ref 0–0.7)
Eosinophils Relative: 6 %
HEMATOCRIT: 30.5 % — AB (ref 35.0–47.0)
HEMOGLOBIN: 10.4 g/dL — AB (ref 12.0–16.0)
Lymphocytes Relative: 16 %
Lymphs Abs: 0.9 10*3/uL — ABNORMAL LOW (ref 1.0–3.6)
MCH: 33.5 pg (ref 26.0–34.0)
MCHC: 34.2 g/dL (ref 32.0–36.0)
MCV: 98 fL (ref 80.0–100.0)
MONO ABS: 0.5 10*3/uL (ref 0.2–0.9)
Monocytes Relative: 9 %
NEUTROS ABS: 3.8 10*3/uL (ref 1.4–6.5)
NEUTROS PCT: 67 %
Platelets: 334 10*3/uL (ref 150–440)
RBC: 3.11 MIL/uL — AB (ref 3.80–5.20)
RDW: 18.2 % — AB (ref 11.5–14.5)
WBC: 5.7 10*3/uL (ref 3.6–11.0)

## 2017-09-03 LAB — COMPREHENSIVE METABOLIC PANEL
ALK PHOS: 55 U/L (ref 38–126)
ALT: 7 U/L (ref 0–44)
ANION GAP: 14 (ref 5–15)
AST: 21 U/L (ref 15–41)
Albumin: 4 g/dL (ref 3.5–5.0)
BILIRUBIN TOTAL: 0.5 mg/dL (ref 0.3–1.2)
BUN: 17 mg/dL (ref 6–20)
CALCIUM: 8.5 mg/dL — AB (ref 8.9–10.3)
CO2: 25 mmol/L (ref 22–32)
Chloride: 97 mmol/L — ABNORMAL LOW (ref 98–111)
Creatinine, Ser: 6.47 mg/dL — ABNORMAL HIGH (ref 0.44–1.00)
GFR calc non Af Amer: 7 mL/min — ABNORMAL LOW (ref >60)
GFR, EST AFRICAN AMERICAN: 8 mL/min — AB (ref >60)
Glucose, Bld: 110 mg/dL — ABNORMAL HIGH (ref 70–99)
Potassium: 4.3 mmol/L (ref 3.5–5.1)
SODIUM: 136 mmol/L (ref 135–145)
Total Protein: 7.8 g/dL (ref 6.5–8.1)

## 2017-09-03 LAB — BRAIN NATRIURETIC PEPTIDE: B NATRIURETIC PEPTIDE 5: 95 pg/mL (ref 0.0–100.0)

## 2017-09-03 LAB — TROPONIN I

## 2017-09-03 MED ORDER — NYSTATIN 100000 UNIT/GM EX OINT
TOPICAL_OINTMENT | Freq: Once | CUTANEOUS | Status: AC
  Administered 2017-09-03: 12:00:00 via TOPICAL
  Filled 2017-09-03: qty 15

## 2017-09-03 NOTE — ED Notes (Signed)
Lab states they do not have all of the tubes they need. Lab asked to come stick pt as pt has been stuck multiple times and is a very difficult stick.

## 2017-09-03 NOTE — ED Notes (Signed)
Dr. Mable Paris aware unable to collect enough urine and order discontinued by lab.

## 2017-09-03 NOTE — ED Notes (Addendum)
1 unsuccessful IV attempt by this nurse. Restricted right arm. Pt has limited access.

## 2017-09-03 NOTE — ED Notes (Signed)
Pt provided with apple juice and graham crackers.

## 2017-09-03 NOTE — ED Provider Notes (Signed)
[facility] Emergency Department Provider Note  ____________________________________________   First MD Initiated Contact with Patient 09/03/17 1055     (approximate)  I have reviewed the triage vital signs and the nursing notes.   HISTORY  Chief Complaint Weakness   HPI Judy Kelley is a 52 y.o. female who comes to the emergency department via EMS from dialysis reporting generalized malaise.   She did have a full run of dialysis today.  She said that she feels generalized tingling in bilateral feet.  No fevers or chills.  No chest pain or shortness of breath.  No abdominal pain nausea or vomiting.  She has a long-standing history of end-stage renal disease secondary to previous kidney stones and malfunction of percutaneous nephrostomy tubes.   Past Medical History:  Diagnosis Date  . Anemia   . Anxiety   . Cervical cancer (HCC)    Cervical and ovarian  . DVT (deep venous thrombosis) ([place])   . History of kidney stones   . Hypertension    treated years ago, on no medications for at least years ( as of 2019)  . Renal disorder    ESRD - stage 5 Dialysis T/Th/Sa  . Renal insufficiency     Patient Active Problem List   Diagnosis Date Noted  . Pre-operative cardiovascular examination 07/05/2017  . Acute deep vein thrombosis (DVT) of iliac vein of left lower extremity ([place]) 07/04/2017  . CKD (chronic kidney disease) stage 5, GFR less than 15 ml/min (HCC) 07/04/2017  . Fatigue 07/04/2017  . Metabolic acidosis, normal anion gap (NAG) 07/04/2017  . MRSA (methicillin resistant staph aureus) culture positive 07/04/2017  . Sinus bradycardia 07/04/2017  . Urinary tract infection associated with catheterization of urinary tract ([place]) 07/04/2017  . Abdominal pain 06/11/2017  . Anemia 06/11/2017  . DVT (deep venous thrombosis) ([place])   . Complication of vascular access for dialysis 05/03/2017  . ESRD on hemodialysis ([place]) 05/03/2017  . History of DVT (deep  vein thrombosis) 05/03/2017  . Uropathy, obstructive 05/03/2017  . Cancer ([place]) 05/02/2017  . Anxiety 05/02/2017  . UTI (urinary tract infection) 04/16/2017  . Pyelonephritis 04/16/2017  . Chronic anticoagulation 04/16/2017  . History of adverse effect of venous thromboembolism (VTE) prophylaxis 04/16/2017  . Depression 04/16/2017  . Infection and inflammatory reaction due to nephrostomy catheter, initial encounter ([place])   . ESRD (end stage renal disease) ([place]) 03/21/2017  . MRSA bacteremia   . ESRD on dialysis ([place]) 12/04/2016  . Hypokalemia 12/04/2016  . History of ovarian cancer 12/04/2016  . History of nephrostomy 12/26/2014  . Pre-transplant evaluation for kidney transplant 12/26/2014  . Proteinuria 01/25/2014  . Complication of nephrostomy ([place]) 01/04/2013  . Hypertension 01/04/2013  . Malfunction of nephrostomy tube ([place]) 01/04/2013  . Ovarian cancer ([place]) 01/04/2013    Past Surgical History:  Procedure Laterality Date  . AV FISTULA PLACEMENT Right 03/21/2017   Procedure: INSERTION OF ARTERIOVENOUS (AV) GORE-TEX GRAFT RIGHT THIGH;  Surgeon: Angelia Mould, MD;  Location: [place];  Service: Vascular;  Laterality: Right;  . CYSTOSCOPY N/A 12/09/2016   Procedure: ENDOSCOPY OF ILEAL CONDUIT;  Surgeon: Irine Seal, MD;  Location: [place];  Service: Urology;  Laterality: N/A;  . GRAFT APPLICATION Right 74/12/8784   INSERTION OF ARTERIOVENOUS (AV) GORE-TEX GRAFT RIGHT THIGH   . ileal conduit    . IR FLUORO GUIDE CV LINE LEFT  06/15/2017  . IR FLUORO GUIDE CV LINE RIGHT  12/09/2016  . IR FLUORO GUIDE CV LINE  RIGHT  06/07/2017  . IR NEPHROSTOMY EXCHANGE RIGHT  01/21/2017  . IR NEPHROSTOMY EXCHANGE RIGHT  04/13/2017  . IR NEPHROSTOMY EXCHANGE RIGHT  07/06/2017  . IR NEPHROSTOMY EXCHANGE RIGHT  07/28/2017  . IR PTA VENOUS EXCEPT DIALYSIS CIRCUIT  06/07/2017  . IR REMOVAL TUN CV CATH W/O FL  06/29/2017  . IR THROMBECTOMY AV FISTULA W/THROMBOLYSIS/PTA/STENT INC/SHUNT/IMG RT Right 12/09/2016    . IR US GUIDE VASC ACCESS LEFT  06/15/2017  . IR US GUIDE VASC ACCESS RIGHT  12/09/2016  . IR US GUIDE VASC ACCESS RIGHT  12/09/2016  . IR VENOCAVAGRAM IVC  06/07/2017  . nephrosotomy    . TONSILLECTOMY    . TUBAL LIGATION      Prior to Admission medications   Medication Sig Start Date End Date Taking? Authorizing Provider  acetaminophen (TYLENOL) 500 MG tablet Take 1,000 mg by mouth every 6 (six) hours as needed for mild pain or headache.     [provider]  apixaban (ELIQUIS) 5 MG TABS tablet Take 5 mg by mouth 2 (two) times daily.    [provider]  cinacalcet (SENSIPAR) 90 MG tablet Take 90 mg by mouth daily.    [provider]  gabapentin (NEURONTIN) 100 MG capsule Take 100 mg by mouth at bedtime.    [provider]  lanthanum (FOSRENOL) 1000 MG chewable tablet Chew 1 tablet (1,000 mg total) by mouth 3 (three) times daily with meals. 04/23/17   Patrecia Pour, MD  loperamide (IMODIUM) 2 MG capsule Take 1 capsule (2 mg total) by mouth as needed for diarrhea or loose stools. 04/23/17   Patrecia Pour, MD  multivitamin (RENA-VIT) TABS tablet Take 1 tablet by mouth daily.    [provider]  mupirocin ointment (BACTROBAN) 2 % Apply 1 application topically 2 (two) times daily as needed (rash).     [provider]  promethazine (PHENERGAN) 25 MG tablet Take 25 mg by mouth every 6 (six) hours as needed for nausea or vomiting.    [provider]  sertraline (ZOLOFT) 50 MG tablet Take 50 mg by mouth at bedtime.     [provider]    Allergies Ciprofloxacin; Erythromycin; Chlorhexidine; and Other  Family History  Problem Relation Age of Onset  . Diabetes Mellitus II Mother   . Kidney disease Mother   . Heart disease Mother   . Rheum arthritis Mother   . COPD Mother   . Diabetes Mellitus II Father   . Heart disease Father   . Dementia Father   . Ovarian cancer Maternal Aunt     Social History Social History    Tobacco Use  . Smoking status: Never Smoker  . Smokeless tobacco: Never Used  Substance Use Topics  . Alcohol use: Yes    Frequency: Never    Comment: occasional wine  . Drug use: No    Review of Systems Constitutional: No fever/chills Eyes: No visual changes. ENT: No sore throat. Cardiovascular: Denies chest pain. Respiratory: Denies shortness of breath. Gastrointestinal: No abdominal pain.  No nausea, no vomiting.  No diarrhea.  No constipation. Genitourinary: Negative for dysuria. Musculoskeletal: Negative for back pain. Skin: Negative for rash. Neurological: Negative for headache or seizure   ____________________________________________   PHYSICAL EXAM:  VITAL SIGNS: ED Triage Vitals  Enc Vitals Group     BP 09/03/17 1032 105/64     Pulse Rate 09/03/17 1031 (!) 56     Resp 09/03/17 1031 16  Temp 09/03/17 1031 98 F (36.7 C)     Temp Source 09/03/17 1031 Oral     SpO2 09/03/17 1031 100 %     Weight 09/03/17 1031 270 lb 1 oz (122.5 kg)     Height 09/03/17 1031 5\' 7"  (1.702 m)     Head Circumference --      Peak Flow --      Pain Score 09/03/17 1031 7     Pain Loc --      Pain Edu? --      Excl. in [place]? --     Constitutional: Alert and oriented x4 pleasant cooperative speaks in full clear sentences no diaphoresis Eyes: PERRL EOMI. midrange and brisk Head: Atraumatic. Nose: No congestion/rhinnorhea. Mouth/Throat: No trismus Neck: No stridor.   Cardiovascular: Normal rate, regular rhythm. Grossly normal heart sounds.  Good peripheral circulation. Respiratory: Normal respiratory effort.  No retractions. Lungs CTAB and moving good air Gastrointestinal: Obese soft nontender Musculoskeletal: No lower extremity edema   Neurologic:  Normal speech and language.  No pronator drift 5 out of 5 grips biceps triceps hip flexion extension plantar flexion dorsiflexion sensation intact light Skin: Roughly 15 cm area of tinea under her left axilla Psychiatric: Mood  and affect are normal. Speech and behavior are normal.    ____________________________________________   DIFFERENTIAL includes but not limited to  Dehydration, metabolic derangement, stroke TIA ____________________________________________   LABS (all labs ordered are listed, but only abnormal results are displayed)  Labs Reviewed  COMPREHENSIVE METABOLIC PANEL - Abnormal; Notable for the following components:      Result Value   Chloride 97 (*)    Glucose, Bld 110 (*)    Creatinine, Ser 6.47 (*)    Calcium 8.5 (*)    GFR calc non Af Amer 7 (*)    GFR calc Af Amer 8 (*)    All other components within normal limits  CBC WITH DIFFERENTIAL/PLATELET - Abnormal; Notable for the following components:   RBC 3.11 (*)    Hemoglobin 10.4 (*)    HCT 30.5 (*)    RDW 18.2 (*)    Lymphs Abs 0.9 (*)    All other components within normal limits  BRAIN NATRIURETIC PEPTIDE  TROPONIN I    Lab work reviewed by me with chronic kidney disease but no acute disease noted __________________________________________  EKG  ED ECG REPORT I, Darel Hong, the attending physician, personally viewed and interpreted this ECG.  Date: 09/03/2017 EKG Time:  Rate: 61 Rhythm: normal sinus rhythm QRS Axis: normal Intervals: normal ST/T Wave abnormalities: normal Narrative Interpretation: no evidence of acute ischemia.  Unchanged EKG from 07/05/2017  ____________________________________________  RADIOLOGY  X-ray reviewed by me with no acute disease Head CT reviewed by me with no acute disease noted ____________________________________________   PROCEDURES  Procedure(s) performed: no  Procedures  Critical Care performed: no  ____________________________________________   INITIAL IMPRESSION / ASSESSMENT AND PLAN / ED COURSE  Pertinent labs & imaging results that were available during my care of the patient were reviewed by me and considered in my medical decision making (see chart for  details).   As part of my medical decision making, I reviewed the following data within the [place] History obtained from family if available, nursing notes, old chart and ekg, as well as notes from prior ED visits.  Patient comes to the emergency department with roughly 24 hours of vague numbness and tingling along with malaise.  Lab work is fortunately  reassuring.  She does have what appears to be tinea corporis under her left arm so given nystatin.  Encouraged her to continue her follow-up as scheduled.  Strict return precautions have been given.  Neuro exam is nonfocal.      ____________________________________________   FINAL CLINICAL IMPRESSION(S) / ED DIAGNOSES  Final diagnoses:  Tinea corporis  Weakness  Tingling      NEW MEDICATIONS STARTED DURING THIS VISIT:  Discharge Medication List as of 09/03/2017  3:59 PM       Note:  This document was prepared using Dragon voice recognition software and may include unintentional dictation errors.     Darel Hong, MD 09/05/17 [phone]

## 2017-09-03 NOTE — ED Triage Notes (Signed)
Pt brought in via EMS from dialysis. Pt reports generalized weakness and states she just doesn't feel well. Describes numbness in her legs and feet. Pt missed some dialysis treatments last week due to access issues so pt had dialysis this week wed/thur and today. Pt did receive full treatment today lasting 4 hours.

## 2017-09-03 NOTE — Discharge Instructions (Signed)
It was a pleasure to take care of you today, and thank you for coming to our emergency department.  If you have any questions or concerns before leaving please ask the nurse to grab me and I'm more than happy to go through your aftercare instructions again.  If you were prescribed any opioid pain medication today such as Norco, Vicodin, Percocet, morphine, hydrocodone, or oxycodone please make sure you do not drive when you are taking this medication as it can alter your ability to drive safely.  If you have any concerns once you are home that you are not improving or are in fact getting worse before you can make it to your follow-up appointment, please do not hesitate to call 911 and come back for further evaluation.  Darel Hong, MD  Results for orders placed or performed during the hospital encounter of 09/03/17  Comprehensive metabolic panel  Result Value Ref Range   Sodium 136 135 - 145 mmol/L   Potassium 4.3 3.5 - 5.1 mmol/L   Chloride 97 (L) 98 - 111 mmol/L   CO2 25 22 - 32 mmol/L   Glucose, Bld 110 (H) 70 - 99 mg/dL   BUN 17 6 - 20 mg/dL   Creatinine, Ser 6.47 (H) 0.44 - 1.00 mg/dL   Calcium 8.5 (L) 8.9 - 10.3 mg/dL   Total Protein 7.8 6.5 - 8.1 g/dL   Albumin 4.0 3.5 - 5.0 g/dL   AST 21 15 - 41 U/L   ALT 7 0 - 44 U/L   Alkaline Phosphatase 55 38 - 126 U/L   Total Bilirubin 0.5 0.3 - 1.2 mg/dL   GFR calc non Af Amer 7 (L) >60 mL/min   GFR calc Af Amer 8 (L) >60 mL/min   Anion gap 14 5 - 15  Brain natriuretic peptide  Result Value Ref Range   B Natriuretic Peptide 95.0 0.0 - 100.0 pg/mL  Troponin I  Result Value Ref Range   Troponin I <0.03 <0.03 ng/mL  CBC with Differential/Platelet  Result Value Ref Range   WBC 5.7 3.6 - 11.0 K/uL   RBC 3.11 (L) 3.80 - 5.20 MIL/uL   Hemoglobin 10.4 (L) 12.0 - 16.0 g/dL   HCT 30.5 (L) 35.0 - 47.0 %   MCV 98.0 80.0 - 100.0 fL   MCH 33.5 26.0 - 34.0 pg   MCHC 34.2 32.0 - 36.0 g/dL   RDW 18.2 (H) 11.5 - 14.5 %   Platelets 334 150 -  440 K/uL   Neutrophils Relative % 67 %   Neutro Abs 3.8 1.4 - 6.5 K/uL   Lymphocytes Relative 16 %   Lymphs Abs 0.9 (L) 1.0 - 3.6 K/uL   Monocytes Relative 9 %   Monocytes Absolute 0.5 0.2 - 0.9 K/uL   Eosinophils Relative 6 %   Eosinophils Absolute 0.4 0 - 0.7 K/uL   Basophils Relative 2 %   Basophils Absolute 0.1 0 - 0.1 K/uL   Dg Chest 2 View  Result Date: 09/03/2017 CLINICAL DATA:  Weakness. EXAM: CHEST - 2 VIEW COMPARISON:  Chest x-ray dated June 11, 2017. FINDINGS: Interval exchange of a tunneled right internal jugular dialysis catheter. The heart size and mediastinal contours are within normal limits. Normal pulmonary vascularity. No focal consolidation, pleural effusion, or pneumothorax. No acute osseous abnormality. Unchanged vascular stents overlying the right upper arm. IMPRESSION: No active cardiopulmonary disease. Electronically Signed   By: Titus Dubin M.D.   On: 09/03/2017 11:03   Ct Head Wo Contrast  Result Date: 09/03/2017 CLINICAL DATA:  52 year old with end-stage renal disease who reports generalized weakness and BILATERAL LOWER extremity numbness after dialysis today. EXAM: CT HEAD WITHOUT CONTRAST TECHNIQUE: Contiguous axial images were obtained from the base of the skull through the vertex without intravenous contrast. COMPARISON:  None. FINDINGS: Brain: Ventricular system normal in size and appearance for age. No mass lesion. No midline shift. No acute hemorrhage or hematoma. No extra-axial fluid collections. No evidence of acute infarction. No focal brain parenchymal abnormalities. Vascular: No hyperdense vessel.  No visible atherosclerosis. Skull: No skull fracture or other focal osseous abnormality involving the skull. Sinuses/Orbits: Visualized paranasal sinuses, bilateral mastoid air cells and bilateral middle ear cavities well-aerated. Visualized orbits and globes normal in appearance. Other: None. IMPRESSION: Normal examination. Electronically Signed   By: Evangeline Dakin M.D.   On: 09/03/2017 12:34

## 2017-09-29 ENCOUNTER — Other Ambulatory Visit (HOSPITAL_COMMUNITY): Payer: Medicare Other

## 2017-10-07 ENCOUNTER — Other Ambulatory Visit: Payer: Self-pay

## 2017-10-07 ENCOUNTER — Inpatient Hospital Stay
Admission: EM | Admit: 2017-10-07 | Discharge: 2017-11-04 | DRG: 175 | Disposition: E | Payer: Medicare Other | Attending: Internal Medicine | Admitting: Internal Medicine

## 2017-10-07 ENCOUNTER — Emergency Department: Payer: Medicare Other

## 2017-10-07 ENCOUNTER — Encounter: Payer: Self-pay | Admitting: Emergency Medicine

## 2017-10-07 DIAGNOSIS — I82532 Chronic embolism and thrombosis of left popliteal vein: Secondary | ICD-10-CM | POA: Diagnosis present

## 2017-10-07 DIAGNOSIS — R079 Chest pain, unspecified: Secondary | ICD-10-CM | POA: Diagnosis not present

## 2017-10-07 DIAGNOSIS — I469 Cardiac arrest, cause unspecified: Secondary | ICD-10-CM | POA: Diagnosis not present

## 2017-10-07 DIAGNOSIS — R000 Tachycardia, unspecified: Secondary | ICD-10-CM | POA: Diagnosis present

## 2017-10-07 DIAGNOSIS — I4510 Unspecified right bundle-branch block: Secondary | ICD-10-CM | POA: Diagnosis present

## 2017-10-07 DIAGNOSIS — D631 Anemia in chronic kidney disease: Secondary | ICD-10-CM | POA: Diagnosis present

## 2017-10-07 DIAGNOSIS — Z992 Dependence on renal dialysis: Secondary | ICD-10-CM | POA: Diagnosis not present

## 2017-10-07 DIAGNOSIS — N2581 Secondary hyperparathyroidism of renal origin: Secondary | ICD-10-CM | POA: Diagnosis present

## 2017-10-07 DIAGNOSIS — Z87440 Personal history of urinary (tract) infections: Secondary | ICD-10-CM

## 2017-10-07 DIAGNOSIS — Z7901 Long term (current) use of anticoagulants: Secondary | ICD-10-CM | POA: Diagnosis not present

## 2017-10-07 DIAGNOSIS — Z8041 Family history of malignant neoplasm of ovary: Secondary | ICD-10-CM

## 2017-10-07 DIAGNOSIS — Z86711 Personal history of pulmonary embolism: Secondary | ICD-10-CM

## 2017-10-07 DIAGNOSIS — Z6841 Body Mass Index (BMI) 40.0 and over, adult: Secondary | ICD-10-CM | POA: Diagnosis not present

## 2017-10-07 DIAGNOSIS — Z79899 Other long term (current) drug therapy: Secondary | ICD-10-CM | POA: Diagnosis not present

## 2017-10-07 DIAGNOSIS — Z8543 Personal history of malignant neoplasm of ovary: Secondary | ICD-10-CM

## 2017-10-07 DIAGNOSIS — F419 Anxiety disorder, unspecified: Secondary | ICD-10-CM | POA: Diagnosis present

## 2017-10-07 DIAGNOSIS — I82512 Chronic embolism and thrombosis of left femoral vein: Secondary | ICD-10-CM | POA: Diagnosis present

## 2017-10-07 DIAGNOSIS — I120 Hypertensive chronic kidney disease with stage 5 chronic kidney disease or end stage renal disease: Secondary | ICD-10-CM | POA: Diagnosis present

## 2017-10-07 DIAGNOSIS — Z8541 Personal history of malignant neoplasm of cervix uteri: Secondary | ICD-10-CM

## 2017-10-07 DIAGNOSIS — I2602 Saddle embolus of pulmonary artery with acute cor pulmonale: Secondary | ICD-10-CM | POA: Diagnosis present

## 2017-10-07 DIAGNOSIS — Z87442 Personal history of urinary calculi: Secondary | ICD-10-CM | POA: Diagnosis not present

## 2017-10-07 DIAGNOSIS — I959 Hypotension, unspecified: Secondary | ICD-10-CM | POA: Diagnosis not present

## 2017-10-07 DIAGNOSIS — Z87892 Personal history of anaphylaxis: Secondary | ICD-10-CM | POA: Diagnosis not present

## 2017-10-07 DIAGNOSIS — Z9851 Tubal ligation status: Secondary | ICD-10-CM | POA: Diagnosis not present

## 2017-10-07 DIAGNOSIS — N186 End stage renal disease: Secondary | ICD-10-CM | POA: Diagnosis present

## 2017-10-07 DIAGNOSIS — Z8249 Family history of ischemic heart disease and other diseases of the circulatory system: Secondary | ICD-10-CM

## 2017-10-07 DIAGNOSIS — Z881 Allergy status to other antibiotic agents status: Secondary | ICD-10-CM

## 2017-10-07 DIAGNOSIS — Z841 Family history of disorders of kidney and ureter: Secondary | ICD-10-CM

## 2017-10-07 DIAGNOSIS — I2699 Other pulmonary embolism without acute cor pulmonale: Secondary | ICD-10-CM

## 2017-10-07 LAB — COMPREHENSIVE METABOLIC PANEL
ALT: 10 U/L (ref 0–44)
AST: 29 U/L (ref 15–41)
Albumin: 4.1 g/dL (ref 3.5–5.0)
Alkaline Phosphatase: 65 U/L (ref 38–126)
Anion gap: 17 — ABNORMAL HIGH (ref 5–15)
BUN: 36 mg/dL — ABNORMAL HIGH (ref 6–20)
CHLORIDE: 97 mmol/L — AB (ref 98–111)
CO2: 24 mmol/L (ref 22–32)
CREATININE: 8.21 mg/dL — AB (ref 0.44–1.00)
Calcium: 9 mg/dL (ref 8.9–10.3)
GFR, EST AFRICAN AMERICAN: 6 mL/min — AB (ref >60)
GFR, EST NON AFRICAN AMERICAN: 5 mL/min — AB (ref >60)
Glucose, Bld: 141 mg/dL — ABNORMAL HIGH (ref 70–99)
POTASSIUM: 5.1 mmol/L (ref 3.5–5.1)
Sodium: 138 mmol/L (ref 135–145)
Total Bilirubin: 0.5 mg/dL (ref 0.3–1.2)
Total Protein: 8 g/dL (ref 6.5–8.1)

## 2017-10-07 LAB — CBC WITH DIFFERENTIAL/PLATELET
BASOS ABS: 0.1 10*3/uL (ref 0–0.1)
BASOS PCT: 1 %
Eosinophils Absolute: 0.1 10*3/uL (ref 0–0.7)
Eosinophils Relative: 1 %
HCT: 37.6 % (ref 35.0–47.0)
Hemoglobin: 12.1 g/dL (ref 12.0–16.0)
LYMPHS PCT: 11 %
Lymphs Abs: 0.8 10*3/uL — ABNORMAL LOW (ref 1.0–3.6)
MCH: 31.7 pg (ref 26.0–34.0)
MCHC: 32.2 g/dL (ref 32.0–36.0)
MCV: 98.4 fL (ref 80.0–100.0)
Monocytes Absolute: 0.5 10*3/uL (ref 0.2–0.9)
Monocytes Relative: 7 %
Neutro Abs: 6.1 10*3/uL (ref 1.4–6.5)
Neutrophils Relative %: 80 %
Platelets: 237 10*3/uL (ref 150–440)
RBC: 3.82 MIL/uL (ref 3.80–5.20)
RDW: 17.3 % — ABNORMAL HIGH (ref 11.5–14.5)
WBC: 7.6 10*3/uL (ref 3.6–11.0)

## 2017-10-07 LAB — FIBRIN DERIVATIVES D-DIMER (ARMC ONLY): FIBRIN DERIVATIVES D-DIMER (ARMC): 4106.57 ng{FEU}/mL — AB (ref 0.00–499.00)

## 2017-10-07 LAB — BRAIN NATRIURETIC PEPTIDE: B NATRIURETIC PEPTIDE 5: 1307 pg/mL — AB (ref 0.0–100.0)

## 2017-10-07 LAB — APTT: APTT: 33 s (ref 24–36)

## 2017-10-07 LAB — PROTIME-INR
INR: 1.18
Prothrombin Time: 14.9 seconds (ref 11.4–15.2)

## 2017-10-07 LAB — TROPONIN I: TROPONIN I: 0.75 ng/mL — AB (ref <0.03)

## 2017-10-07 MED ORDER — PROMETHAZINE HCL 25 MG PO TABS
25.0000 mg | ORAL_TABLET | Freq: Four times a day (QID) | ORAL | Status: DC | PRN
  Filled 2017-10-07: qty 1

## 2017-10-07 MED ORDER — IOHEXOL 350 MG/ML SOLN
75.0000 mL | Freq: Once | INTRAVENOUS | Status: AC | PRN
  Administered 2017-10-07: 75 mL via INTRAVENOUS

## 2017-10-07 MED ORDER — HEPARIN (PORCINE) IN NACL 100-0.45 UNIT/ML-% IJ SOLN: 10.0000 [IU]/kg/h | Freq: Once | INTRAMUSCULAR | Status: DC

## 2017-10-07 MED ORDER — SODIUM CHLORIDE 0.9 % IV SOLN: 250.0000 mL | INTRAVENOUS | Status: DC | PRN

## 2017-10-07 MED ORDER — TRAZODONE HCL 50 MG PO TABS
50.0000 mg | ORAL_TABLET | Freq: Every evening | ORAL | Status: DC | PRN
  Administered 2017-10-07 – 2017-10-08 (×2): 50 mg via ORAL
  Filled 2017-10-07 (×2): qty 1

## 2017-10-07 MED ORDER — ONDANSETRON HCL 4 MG PO TABS: 4.0000 mg | ORAL_TABLET | Freq: Four times a day (QID) | ORAL | Status: DC | PRN

## 2017-10-07 MED ORDER — HEPARIN (PORCINE) IN NACL 100-0.45 UNIT/ML-% IJ SOLN
1450.0000 [IU]/h | INTRAMUSCULAR | Status: DC
  Administered 2017-10-07: 1450 [IU]/h via INTRAVENOUS
  Filled 2017-10-07 (×2): qty 250

## 2017-10-07 MED ORDER — HEPARIN BOLUS VIA INFUSION
4500.0000 [IU] | Freq: Once | INTRAVENOUS | Status: AC
  Administered 2017-10-07: 4500 [IU] via INTRAVENOUS
  Filled 2017-10-07: qty 4500

## 2017-10-07 MED ORDER — HYDROCODONE-ACETAMINOPHEN 5-325 MG PO TABS
1.0000 | ORAL_TABLET | ORAL | Status: DC | PRN
  Administered 2017-10-07 – 2017-10-08 (×3): 2 via ORAL
  Filled 2017-10-07 (×3): qty 2

## 2017-10-07 MED ORDER — RENA-VITE PO TABS
1.0000 | ORAL_TABLET | Freq: Every day | ORAL | Status: DC
  Filled 2017-10-07 (×3): qty 1

## 2017-10-07 MED ORDER — POLYETHYLENE GLYCOL 3350 17 G PO PACK: 17.0000 g | PACK | Freq: Every day | ORAL | Status: DC | PRN

## 2017-10-07 MED ORDER — HEPARIN SODIUM (PORCINE) 5000 UNIT/ML IJ SOLN: 4000.0000 [IU] | Freq: Once | INTRAMUSCULAR | Status: DC

## 2017-10-07 MED ORDER — SERTRALINE HCL 50 MG PO TABS
50.0000 mg | ORAL_TABLET | Freq: Every day | ORAL | Status: DC
  Administered 2017-10-07 – 2017-10-08 (×2): 50 mg via ORAL
  Filled 2017-10-07 (×2): qty 1

## 2017-10-07 MED ORDER — ACETAMINOPHEN 500 MG PO TABS
1000.0000 mg | ORAL_TABLET | Freq: Four times a day (QID) | ORAL | Status: DC | PRN
  Administered 2017-10-08: 1000 mg via ORAL
  Filled 2017-10-07: qty 2

## 2017-10-07 MED ORDER — SODIUM CHLORIDE 0.9% FLUSH
3.0000 mL | Freq: Two times a day (BID) | INTRAVENOUS | Status: DC
  Administered 2017-10-07 – 2017-10-08 (×3): 3 mL via INTRAVENOUS

## 2017-10-07 MED ORDER — LOPERAMIDE HCL 2 MG PO CAPS: 2.0000 mg | ORAL_CAPSULE | ORAL | Status: DC | PRN

## 2017-10-07 MED ORDER — SODIUM CHLORIDE 0.9% FLUSH: 3.0000 mL | INTRAVENOUS | Status: DC | PRN

## 2017-10-07 MED ORDER — MUPIROCIN 2 % EX OINT
1.0000 "application " | TOPICAL_OINTMENT | Freq: Two times a day (BID) | CUTANEOUS | Status: DC | PRN
  Filled 2017-10-07: qty 22

## 2017-10-07 MED ORDER — GABAPENTIN 100 MG PO CAPS
100.0000 mg | ORAL_CAPSULE | Freq: Every day | ORAL | Status: DC
  Administered 2017-10-07 – 2017-10-08 (×2): 100 mg via ORAL
  Filled 2017-10-07 (×2): qty 1

## 2017-10-07 MED ORDER — CINACALCET HCL 30 MG PO TABS
90.0000 mg | ORAL_TABLET | Freq: Every day | ORAL | Status: DC
  Administered 2017-10-08: 90 mg via ORAL
  Filled 2017-10-07 (×2): qty 3

## 2017-10-07 MED ORDER — LANTHANUM CARBONATE 500 MG PO CHEW
1000.0000 mg | CHEWABLE_TABLET | Freq: Three times a day (TID) | ORAL | Status: DC
  Administered 2017-10-08 (×2): 1000 mg via ORAL
  Filled 2017-10-07 (×5): qty 2

## 2017-10-07 MED ORDER — ONDANSETRON HCL 4 MG/2ML IJ SOLN
4.0000 mg | Freq: Four times a day (QID) | INTRAMUSCULAR | Status: DC | PRN
  Administered 2017-10-08 – 2017-10-09 (×3): 4 mg via INTRAVENOUS
  Filled 2017-10-07 (×3): qty 2

## 2017-10-07 NOTE — ED Notes (Signed)
Pt requests to be transferred to [facility]. EDP notified.

## 2017-10-07 NOTE — ED Notes (Signed)
Report given to Kat, RN

## 2017-10-07 NOTE — ED Provider Notes (Signed)
[facility] Emergency Department Provider Note   ____________________________________________   First MD Initiated Contact with Patient 10/17/2017 1802     (approximate)  I have reviewed the triage vital signs and the nursing notes.   HISTORY  Chief Complaint Chest Pain    HPI Judy Kelley is a 52 y.o. female who had one brief episode of chest pain with shortness of breath it was pleuritic this morning that went away spontaneously and then she was walking to get into [employer] today and developed sudden fairly severe central chest pain worse with deep breathing and exertion dizziness and shortness of breath.  On arrival here in the emergency room she is also tachycardic.  EKG obtained by EMS shows [id number] and a new right bundle branch block.   Past Medical History:  Diagnosis Date  . Anemia   . Anxiety   . Cervical cancer (HCC)    Cervical and ovarian  . DVT (deep venous thrombosis) ([place])   . History of kidney stones   . Hypertension    treated years ago, on no medications for at least years ( as of 2019)  . Renal disorder    ESRD - stage 5 Dialysis T/Th/Sa  . Renal insufficiency     Patient Active Problem List   Diagnosis Date Noted  . Pre-operative cardiovascular examination 07/05/2017  . Acute deep vein thrombosis (DVT) of iliac vein of left lower extremity ([place]) 07/04/2017  . CKD (chronic kidney disease) stage 5, GFR less than 15 ml/min (HCC) 07/04/2017  . Fatigue 07/04/2017  . Metabolic acidosis, normal anion gap (NAG) 07/04/2017  . MRSA (methicillin resistant staph aureus) culture positive 07/04/2017  . Sinus bradycardia 07/04/2017  . Urinary tract infection associated with catheterization of urinary tract ([place]) 07/04/2017  . Abdominal pain 06/11/2017  . Anemia 06/11/2017  . DVT (deep venous thrombosis) ([place])   . Complication of vascular access for dialysis 05/03/2017  . ESRD on hemodialysis ([place]) 05/03/2017  . History of DVT (deep vein  thrombosis) 05/03/2017  . Uropathy, obstructive 05/03/2017  . Cancer ([place]) 05/02/2017  . Anxiety 05/02/2017  . UTI (urinary tract infection) 04/16/2017  . Pyelonephritis 04/16/2017  . Chronic anticoagulation 04/16/2017  . History of adverse effect of venous thromboembolism (VTE) prophylaxis 04/16/2017  . Depression 04/16/2017  . Infection and inflammatory reaction due to nephrostomy catheter, initial encounter ([place])   . ESRD (end stage renal disease) ([place]) 03/21/2017  . MRSA bacteremia   . ESRD on dialysis ([place]) 12/04/2016  . Hypokalemia 12/04/2016  . History of ovarian cancer 12/04/2016  . History of nephrostomy 12/26/2014  . Pre-transplant evaluation for kidney transplant 12/26/2014  . Proteinuria 01/25/2014  . Complication of nephrostomy ([place]) 01/04/2013  . Hypertension 01/04/2013  . Malfunction of nephrostomy tube ([place]) 01/04/2013  . Ovarian cancer ([place]) 01/04/2013    Past Surgical History:  Procedure Laterality Date  . AV FISTULA PLACEMENT Right 03/21/2017   Procedure: INSERTION OF ARTERIOVENOUS (AV) GORE-TEX GRAFT RIGHT THIGH;  Surgeon: Angelia Mould, MD;  Location: [place];  Service: Vascular;  Laterality: Right;  . CYSTOSCOPY N/A 12/09/2016   Procedure: ENDOSCOPY OF ILEAL CONDUIT;  Surgeon: Irine Seal, MD;  Location: [place];  Service: Urology;  Laterality: N/A;  . GRAFT APPLICATION Right 72/53/6644   INSERTION OF ARTERIOVENOUS (AV) GORE-TEX GRAFT RIGHT THIGH   . ileal conduit    . IR FLUORO GUIDE CV LINE LEFT  06/15/2017  . IR FLUORO GUIDE CV LINE RIGHT  12/09/2016  . IR FLUORO  GUIDE CV LINE RIGHT  06/07/2017  . IR NEPHROSTOMY EXCHANGE RIGHT  01/21/2017  . IR NEPHROSTOMY EXCHANGE RIGHT  04/13/2017  . IR NEPHROSTOMY EXCHANGE RIGHT  07/06/2017  . IR NEPHROSTOMY EXCHANGE RIGHT  07/28/2017  . IR PTA VENOUS EXCEPT DIALYSIS CIRCUIT  06/07/2017  . IR REMOVAL TUN CV CATH W/O FL  06/29/2017  . IR THROMBECTOMY AV FISTULA W/THROMBOLYSIS/PTA/STENT INC/SHUNT/IMG RT Right 12/09/2016  .  IR US GUIDE VASC ACCESS LEFT  06/15/2017  . IR US GUIDE VASC ACCESS RIGHT  12/09/2016  . IR US GUIDE VASC ACCESS RIGHT  12/09/2016  . IR VENOCAVAGRAM IVC  06/07/2017  . nephrosotomy    . TONSILLECTOMY    . TUBAL LIGATION      Prior to Admission medications   Medication Sig Start Date End Date Taking? Authorizing Provider  acetaminophen (TYLENOL) 500 MG tablet Take 1,000 mg by mouth every 6 (six) hours as needed for mild pain or headache.     [provider]  apixaban (ELIQUIS) 5 MG TABS tablet Take 5 mg by mouth 2 (two) times daily.    [provider]  cinacalcet (SENSIPAR) 90 MG tablet Take 90 mg by mouth daily.    [provider]  gabapentin (NEURONTIN) 100 MG capsule Take 100 mg by mouth at bedtime.    [provider]  lanthanum (FOSRENOL) 1000 MG chewable tablet Chew 1 tablet (1,000 mg total) by mouth 3 (three) times daily with meals. 04/23/17   Patrecia Pour, MD  loperamide (IMODIUM) 2 MG capsule Take 1 capsule (2 mg total) by mouth as needed for diarrhea or loose stools. 04/23/17   Patrecia Pour, MD  multivitamin (RENA-VIT) TABS tablet Take 1 tablet by mouth daily.    [provider]  mupirocin ointment (BACTROBAN) 2 % Apply 1 application topically 2 (two) times daily as needed (rash).     [provider]  promethazine (PHENERGAN) 25 MG tablet Take 25 mg by mouth every 6 (six) hours as needed for nausea or vomiting.    [provider]  sertraline (ZOLOFT) 50 MG tablet Take 50 mg by mouth at bedtime.     [provider]    Allergies Ciprofloxacin; Erythromycin; Chlorhexidine; and Other  Family History  Problem Relation Age of Onset  . Diabetes Mellitus II Mother   . Kidney disease Mother   . Heart disease Mother   . Rheum arthritis Mother   . COPD Mother   . Diabetes Mellitus II Father   . Heart disease Father   . Dementia Father   . Ovarian cancer Maternal Aunt     Social History Social History   Tobacco  Use  . Smoking status: Never Smoker  . Smokeless tobacco: Never Used  Substance Use Topics  . Alcohol use: Yes    Frequency: Never    Comment: occasional wine  . Drug use: No    Review of Systems  Constitutional: No fever/chills Eyes: No visual changes. ENT: No sore throat. Cardiovascular:  chest pain. Respiratory:  shortness of breath. Gastrointestinal: No abdominal pain.  No nausea, no vomiting.  No diarrhea.  No constipation. Genitourinary: Negative for dysuria. Musculoskeletal: Negative for back pain. Skin: Negative for rash. Neurological: Negative for headaches, focal weakness  ____________________________________________   PHYSICAL EXAM:  VITAL SIGNS: ED Triage Vitals  Enc Vitals Group     BP 10/19/2017 1804 100/72     Pulse Rate 11/02/2017 1804 (!) 105     Resp 10/22/2017 1804 (!) 30  Temp --      Temp src --      SpO2 10/19/2017 1804 94 %     Weight 10/27/2017 1805 266 lb 12.1 oz (121 kg)     Height 10/15/2017 1805 5\' 7"  (1.702 m)     Head Circumference --      Peak Flow --      Pain Score 10/06/2017 1805 6     Pain Loc --      Pain Edu? --      Excl. in [place]? --     Constitutional: Alert and oriented.  Looks somewhat uncomfortable and breathing hard Eyes: Conjunctivae are normal.  Head: Atraumatic. Nose: No congestion/rhinnorhea. Mouth/Throat: Mucous membranes are moist.  Oropharynx non-erythematous. Neck: No stridor.   Cardiovascular: Normal rate, regular rhythm. Grossly normal heart sounds.  Good peripheral circulation. Respiratory: Normal respiratory effort.  No retractions. Lungs CTAB. Gastrointestinal: Soft and nontender. No distention. No abdominal bruits. No CVA tenderness. Musculoskeletal: No lower extremity tenderness nor edema.   Neurologic:  Normal speech and language. No gross focal neurologic deficits are appreciated. . Skin:  Skin is warm, dry and intact. No rash noted. Psychiatric: Mood and affect are normal. Speech and behavior are  normal.  ____________________________________________   LABS (all labs ordered are listed, but only abnormal results are displayed)  Labs Reviewed  COMPREHENSIVE METABOLIC PANEL - Abnormal; Notable for the following components:      Result Value   Chloride 97 (*)    Glucose, Bld 141 (*)    BUN 36 (*)    Creatinine, Ser 8.21 (*)    GFR calc non Af Amer 5 (*)    GFR calc Af Amer 6 (*)    Anion gap 17 (*)    All other components within normal limits  TROPONIN I - Abnormal; Notable for the following components:   Troponin I 0.75 (*)    All other components within normal limits  CBC WITH DIFFERENTIAL/PLATELET - Abnormal; Notable for the following components:   RDW 17.3 (*)    Lymphs Abs 0.8 (*)    All other components within normal limits  FIBRIN DERIVATIVES D-DIMER ([facility] ONLY) - Abnormal; Notable for the following components:   Fibrin derivatives D-dimer ([facility]) 4,106.57 (*)    All other components within normal limits  BRAIN NATRIURETIC PEPTIDE - Abnormal; Notable for the following components:   B Natriuretic Peptide 1,307.0 (*)    All other components within normal limits  PROTIME-INR  APTT  HEPARIN LEVEL (UNFRACTIONATED)   ____________________________________________  EKG  EKG read and interpreted by me shows sinus tachycardia rate of 102 rightward axis right bundle branch block which is new from 2 months ago there is a S1 every 3 but no T3 at this point.  No other acute changes no sign of acute MI. ____________________________________________  RADIOLOGY  ED MD interpretation: CT read by radiology reviewed by me shows multiple PEs heart strain  Official radiology report(s): Ct Angio Chest Pe W And/or Wo Contrast  Result Date: 10/05/2017 CLINICAL DATA:  Acute onset chest pain and dizziness today. EXAM: CT ANGIOGRAPHY CHEST WITH CONTRAST TECHNIQUE: Multidetector CT imaging of the chest was performed using the standard protocol during bolus administration of intravenous  contrast. Multiplanar CT image reconstructions and MIPs were obtained to evaluate the vascular anatomy. CONTRAST:  75 mL OMNIPAQUE IOHEXOL 350 MG/ML SOLN COMPARISON:  Single-view of the chest this same day. FINDINGS: Cardiovascular: Pulmonary emboli are seen bilaterally. The RV to LV ratio is 2.1 compatible  with right heart strain. The right ventricle is bowed into the left ventricle. Cardiomegaly is identified. No pericardial effusion. No aortic aneurysm. Bilateral central venous catheters are in place with their tips in the lower superior vena cava and just within the right atrium. Mediastinum/Nodes: No enlarged mediastinal, hilar, or axillary lymph nodes. Thyroid gland, trachea, and esophagus demonstrate no significant findings. Lungs/Pleura: No pleural effusion. The lungs demonstrate only mild dependent atelectasis. Upper Abdomen: Negative. Musculoskeletal: Negative. Review of the MIP images confirms the above findings. IMPRESSION: Positive for acute PE with CT evidence of right heart strain (RV/LV Ratio = 2.1) consistent with at least submassive (intermediate risk) PE. The presence of right heart strain has been associated with an increased risk of morbidity and mortality. Please activate Code PE by paging [phone]. Critical Value/emergent results were called by telephone at the time of interpretation on 10/04/2017 at 6:57 pm to Dr. Conni Slipper , who verbally acknowledged these results. Electronically Signed   By: Inge Rise M.D.   On: 10/29/2017 19:00   Dg Chest Portable 1 View  Result Date: 10/08/2017 CLINICAL DATA:  Chest pain and short of breath EXAM: PORTABLE CHEST 1 VIEW COMPARISON:  09/03/2017 FINDINGS: Right-sided central venous catheter tip over the SVC. Left-sided central venous catheter tip over the mid right atrium. Mild cardiomegaly with slight central congestion. No focal opacity or pleural effusion. No pneumothorax. Vascular stents in the right upper extremity. IMPRESSION: 1.  Bilateral central venous catheters, on the right tip projects over the distal SVC, on the left hip projects over mid right atrium 2. Mild cardiomegaly with central vascular congestion. Electronically Signed   By: Donavan Foil M.D.   On: 10/06/2017 18:30    ____________________________________________   PROCEDURES  Procedure(s) performed:   Procedures  Critical Care performed:   ____________________________________________   INITIAL IMPRESSION / ASSESSMENT AND PLAN / ED COURSE  Discussed with Dr. Detterding on call at [facility].  She feels the patient does not need transfer.  She recommends echo troponins heparin reevaluate in the morning         ____________________________________________   FINAL CLINICAL IMPRESSION(S) / ED DIAGNOSES  Final diagnoses:  Acute saddle pulmonary embolism with acute [facility]mon[facility])     ED Discharge Orders    None       Note:  This document was prepared using Dragon voice recognition software and may include unintentional dictation errors.    Nena Polio, MD 10/24/2017 1946

## 2017-10-07 NOTE — Progress Notes (Signed)
Family Meeting Note  Advance Directive:yes  Today a meeting took place with the Patient.  Patient is able to participate   The following clinical team members were present during this meeting:MD  The following were discussed:Patient's diagnosis: Pulmonary embolism, end-stage renal disease, Patient's progosis: Unable to determine and Goals for treatment: Full Code  Additional follow-up to be provided: prn  Time spent during discussion:20 minutes  Gorden Harms, MD

## 2017-10-07 NOTE — ED Notes (Signed)
Attempted report to floor.  

## 2017-10-07 NOTE — ED Triage Notes (Signed)
EMS brought pt in from [place] when pt began to experience chest pain. Pt states she was dizzy all of a sudden. Pt SOB on exertion.

## 2017-10-07 NOTE — H&P (Signed)
[facility] NAME: Judy Kelley    MR#:  [id number]  DATE OF BIRTH:  [birth year]  DATE OF ADMISSION:  10/18/2017  PRIMARY CARE PHYSICIAN: Patient, No Pcp Per   REQUESTING/REFERRING PHYSICIAN:   CHIEF COMPLAINT:   Chief Complaint  Patient presents with  . Chest Pain    HISTORY OF PRESENT ILLNESS: Judy Kelley  is a 52 y.o. 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  PAST MEDICAL HISTORY:   Past Medical History:  Diagnosis Date  . Anemia   . Anxiety   . Cervical cancer (HCC)    Cervical and ovarian  . DVT (deep venous thrombosis) ([place])   . History of kidney stones   . Hypertension    treated years ago, on no medications for at least years ( as of 2019)  . Renal disorder    ESRD - stage 5 Dialysis T/Th/Sa  . Renal insufficiency     PAST SURGICAL HISTORY:  Past Surgical History:  Procedure Laterality Date  . AV FISTULA PLACEMENT Right 03/21/2017   Procedure: INSERTION OF ARTERIOVENOUS (AV) GORE-TEX GRAFT RIGHT THIGH;  Surgeon: Angelia Mould, MD;  Location: [facility];  Service: Vascular;  Laterality: Right;  . CYSTOSCOPY N/A 12/09/2016   Procedure: ENDOSCOPY OF ILEAL CONDUIT;  Surgeon: Irine Seal, MD;  Location: [facility];  Service: Urology;  Laterality: N/A;  . GRAFT APPLICATION Right 41/66/0630   INSERTION OF ARTERIOVENOUS (AV) GORE-TEX GRAFT RIGHT THIGH   . ileal conduit    . IR FLUORO GUIDE CV LINE LEFT  06/15/2017  . IR FLUORO GUIDE CV LINE RIGHT  12/09/2016  . IR FLUORO GUIDE CV  LINE RIGHT  06/07/2017  . IR NEPHROSTOMY EXCHANGE RIGHT  01/21/2017  . IR NEPHROSTOMY EXCHANGE RIGHT  04/13/2017  . IR NEPHROSTOMY EXCHANGE RIGHT  07/06/2017  . IR NEPHROSTOMY EXCHANGE RIGHT  07/28/2017  . IR PTA VENOUS EXCEPT DIALYSIS CIRCUIT  06/07/2017  . IR REMOVAL TUN CV CATH W/O FL  06/29/2017  . IR THROMBECTOMY AV FISTULA W/THROMBOLYSIS/PTA/STENT INC/SHUNT/IMG RT Right 12/09/2016  . IR US GUIDE VASC ACCESS LEFT  06/15/2017  . IR US GUIDE VASC ACCESS RIGHT  12/09/2016  . IR US GUIDE VASC ACCESS RIGHT  12/09/2016  . IR VENOCAVAGRAM IVC  06/07/2017  . nephrosotomy    . TONSILLECTOMY    . TUBAL LIGATION      SOCIAL HISTORY:  Social History   Tobacco Use  . Smoking status: Never Smoker  . Smokeless tobacco: Never Used  Substance Use Topics  . Alcohol use: Yes    Frequency: Never    Comment: occasional wine    FAMILY HISTORY:  Family History  Problem Relation Age of Onset  . Diabetes Mellitus II Mother   . Kidney disease Mother   . Heart disease Mother   . Rheum arthritis Mother   . COPD Mother   . Diabetes Mellitus II Father   . Heart disease Father   . Dementia Father   . Ovarian cancer Maternal Aunt     DRUG ALLERGIES:  Allergies  Allergen  Reactions  . Ciprofloxacin Other (See Comments)    IV only-burning to her veins, can take PO  . Erythromycin Anaphylaxis  . Chlorhexidine Rash  . Other Rash and Other (See Comments)    chloraprep    REVIEW OF SYSTEMS:   CONSTITUTIONAL: No fever, fatigue or weakness.  EYES: No blurred or double vision.  EARS, NOSE, AND THROAT: No tinnitus or ear pain.  RESPIRATORY: No cough, +shortness of breath, no wheezing or hemoptysis.  CARDIOVASCULAR: No chest pain, orthopnea, edema.  GASTROINTESTINAL: No nausea, vomiting, diarrhea or abdominal pain.  GENITOURINARY: No dysuria, hematuria.  ENDOCRINE: No polyuria, nocturia,  HEMATOLOGY: No anemia, easy bruising or bleeding SKIN: No rash or lesion. MUSCULOSKELETAL: No joint pain or arthritis.    NEUROLOGIC: No tingling, numbness, weakness.  PSYCHIATRY: No anxiety or depression.   MEDICATIONS AT HOME:  Prior to Admission medications   Medication Sig Start Date End Date Taking? Authorizing Provider  apixaban (ELIQUIS) 5 MG TABS tablet Take 5 mg by mouth 2 (two) times daily.   Yes [provider]  cinacalcet (SENSIPAR) 90 MG tablet Take 90 mg by mouth daily.   Yes [provider]  gabapentin (NEURONTIN) 100 MG capsule Take 100 mg by mouth at bedtime.   Yes [provider]  lanthanum (FOSRENOL) 1000 MG chewable tablet Chew 1 tablet (1,000 mg total) by mouth 3 (three) times daily with meals. 04/23/17  Yes Patrecia Pour, MD  loperamide (IMODIUM) 2 MG capsule Take 1 capsule (2 mg total) by mouth as needed for diarrhea or loose stools. 04/23/17  Yes Patrecia Pour, MD  multivitamin (RENA-VIT) TABS tablet Take 1 tablet by mouth daily.   Yes [provider]  mupirocin ointment (BACTROBAN) 2 % Apply 1 application topically 2 (two) times daily as needed (rash).    Yes [provider]  promethazine (PHENERGAN) 25 MG tablet Take 25 mg by mouth every 6 (six) hours as needed for nausea or vomiting.   Yes [provider]  sertraline (ZOLOFT) 50 MG tablet Take 50 mg by mouth at bedtime.    Yes [provider]  acetaminophen (TYLENOL) 500 MG tablet Take 1,000 mg by mouth every 6 (six) hours as needed for mild pain or headache.     [provider]      PHYSICAL EXAMINATION:   VITAL SIGNS: Blood pressure 125/76, pulse (!) 107, temperature 98.1 F (36.7 C), temperature source Oral, resp. rate (!) 22, height 5\' 7"  (1.702 m), weight 121 kg, SpO2 94 %.  GENERAL:  52 y.o.-year-old patient lying in the bed with no acute distress.  EYES: Pupils equal, round, reactive to light and accommodation. No scleral icterus. Extraocular muscles intact.  HEENT: Head atraumatic, normocephalic. Oropharynx and nasopharynx clear.  NECK:  Supple, no  jugular venous distention. No thyroid enlargement, no tenderness.  LUNGS: Normal breath sounds bilaterally, no wheezing, rales,rhonchi or crepitation. No use of accessory muscles of respiration.  CARDIOVASCULAR: S1, S2 normal. No murmurs, rubs, or gallops.  ABDOMEN: Soft, nontender, nondistended. Bowel sounds present. No organomegaly or mass.  EXTREMITIES: No pedal edema, cyanosis, or clubbing.  NEUROLOGIC: Cranial nerves II through XII are intact. Muscle strength 5/5 in all extremities. Sensation intact. Gait not checked.  PSYCHIATRIC: The patient is alert and oriented x 3.  SKIN: No obvious rash, lesion, or ulcer.   LABORATORY PANEL:   CBC Recent Labs  Lab 10/21/2017 1831  WBC 7.6  HGB 12.1  HCT 37.6  PLT 237  MCV 98.4  MCH 31.7  MCHC 32.2  RDW 17.3*  LYMPHSABS 0.8*  MONOABS 0.5  EOSABS 0.1  BASOSABS 0.1   ------------------------------------------------------------------------------------------------------------------  Chemistries  Recent Labs  Lab 10/31/2017 1831  NA 138  K 5.1  CL 97*  CO2 24  GLUCOSE 141*  BUN 36*  CREATININE 8.21*  CALCIUM 9.0  AST 29  ALT 10  ALKPHOS 65  BILITOT 0.5   ------------------------------------------------------------------------------------------------------------------ estimated creatinine clearance is 10.8 mL/min (A) (by C-G formula based on SCr of 8.21 mg/dL (H)). ------------------------------------------------------------------------------------------------------------------ No results for input(s): TSH, T4TOTAL, T3FREE, THYROIDAB in the last 72 hours.  Invalid input(s): FREET3   Coagulation profile Recent Labs  Lab 11/03/2017 1904  INR 1.18   ------------------------------------------------------------------------------------------------------------------- No results for input(s): DDIMER in the last 72  hours. -------------------------------------------------------------------------------------------------------------------  Cardiac Enzymes Recent Labs  Lab 10/28/2017 1831  TROPONINI 0.75*   ------------------------------------------------------------------------------------------------------------------ Invalid input(s): POCBNP  ---------------------------------------------------------------------------------------------------------------  Urinalysis    Component Value Date/Time   COLORURINE YELLOW 06/11/2017 0016   APPEARANCEUR CLOUDY (A) 06/11/2017 0016   LABSPEC 1.013 06/11/2017 0016   PHURINE 8.0 06/11/2017 0016   GLUCOSEU NEGATIVE 06/11/2017 0016   HGBUR SMALL (A) 06/11/2017 0016   BILIRUBINUR NEGATIVE 06/11/2017 0016   KETONESUR NEGATIVE 06/11/2017 0016   PROTEINUR 100 (A) 06/11/2017 0016   NITRITE NEGATIVE 06/11/2017 0016   LEUKOCYTESUR LARGE (A) 06/11/2017 0016     RADIOLOGY: Ct Angio Chest Pe W And/or Wo Contrast  Result Date: 10/20/2017 CLINICAL DATA:  Acute onset chest pain and dizziness today. EXAM: CT ANGIOGRAPHY CHEST WITH CONTRAST TECHNIQUE: Multidetector CT imaging of the chest was performed using the standard protocol during bolus administration of intravenous contrast. Multiplanar CT image reconstructions and MIPs were obtained to evaluate the vascular anatomy. CONTRAST:  75 mL OMNIPAQUE IOHEXOL 350 MG/ML SOLN COMPARISON:  Single-view of the chest this same day. FINDINGS: Cardiovascular: Pulmonary emboli are seen bilaterally. The RV to LV ratio is 2.1 compatible with right heart strain. The right ventricle is bowed into the left ventricle. Cardiomegaly is identified. No pericardial effusion. No aortic aneurysm. Bilateral central venous catheters are in place with their tips in the lower superior vena cava and just within the right atrium. Mediastinum/Nodes: No enlarged mediastinal, hilar, or axillary lymph nodes. Thyroid gland, trachea, and esophagus demonstrate no  significant findings. Lungs/Pleura: No pleural effusion. The lungs demonstrate only mild dependent atelectasis. Upper Abdomen: Negative. Musculoskeletal: Negative. Review of the MIP images confirms the above findings. IMPRESSION: Positive for acute PE with CT evidence of right heart strain (RV/LV Ratio = 2.1) consistent with at least submassive (intermediate risk) PE. The presence of right heart strain has been associated with an increased risk of morbidity and mortality. Please activate Code PE by paging [phone]. Critical Value/emergent results were called by telephone at the time of interpretation on 10/30/2017 at 6:57 pm to Dr. Conni Slipper , who verbally acknowledged these results. Electronically Signed   By: Inge Rise M.D.   On: 10/25/2017 19:00   Dg Chest Portable 1 View  Result Date: 10/08/2017 CLINICAL DATA:  Chest pain and short of breath EXAM: PORTABLE CHEST 1 VIEW COMPARISON:  09/03/2017 FINDINGS: Right-sided central venous catheter tip over the SVC. Left-sided central venous catheter tip over the mid right atrium. Mild cardiomegaly with slight central congestion. No focal opacity or pleural effusion. No pneumothorax. Vascular stents in the right upper extremity. IMPRESSION: 1. Bilateral central venous catheters, on the right tip projects over the distal SVC, on the left hip projects over mid right atrium 2.  Mild cardiomegaly with central vascular congestion. Electronically Signed   By: Donavan Foil M.D.   On: 10/08/2017 18:30    EKG: Orders placed or performed during the hospital encounter of 10/21/2017  . EKG 12-Lead  . EKG 12-Lead  . ED EKG  . ED EKG    IMPRESSION AND PLAN: *Acute pulmonary embolus with right heart strain Occurring while on Eliquis, history of DVT Admit to telemetry bed, continue heparin drip for now, adult pain protocol, supplemental oxygen with weaning as tolerated  *Chronic end-stage renal disease On hemodialysis T/TH/SAT Nephrology consulted for  hemodialysis needs  *Chronic anxiety, NOS Stable continue Zoloft  *History of kidney stones Stable  *History of DVT On heparin drip as stated above  All the records are reviewed and case discussed with ED provider. Management plans discussed with the patient, family and they are in agreement.  CODE STATUS:full Code Status History    Date Active Date Inactive Code Status Order ID Comments User Context   06/11/2017 0706 06/15/2017 1902 Full Code [id number]  Radene Gunning, NP ED   04/16/2017 0931 04/23/2017 1651 Full Code [id number]  Vashti Hey, MD ED   03/21/2017 1500 03/22/2017 1317 Full Code [id number]  Angelia Mould, MD Inpatient   12/04/2016 0007 12/14/2016 1707 Full Code [id number]  Rise Patience, MD ED       TOTAL TIME TAKING CARE OF THIS PATIENT: 45 minutes.    Avel Peace Jakhia Buxton M.D on 10/10/2017   Between 7am to 6pm - Pager - [phone]  After 6pm go to [email or web address] - password [email or web address] [facility]  Office  [phone]  CC: Primary care physician; Patient, No Pcp Per   Note: This dictation was prepared with Dragon dictation along with smaller phrase technology. Any transcriptional errors that result from this process are unintentional.

## 2017-10-07 NOTE — Consult Note (Signed)
ANTICOAGULATION CONSULT NOTE - Initial Consult  Pharmacy Consult for heparin infusion Indication: pulmonary embolus  Allergies  Allergen Reactions  . Ciprofloxacin Other (See Comments)    IV only-burning to her veins, can take PO  . Erythromycin Anaphylaxis  . Chlorhexidine Rash  . Other Rash and Other (See Comments)    chloraprep    Patient Measurements: Height: 5\' 7"  (170.2 cm) Weight: 266 lb 12.1 oz (121 kg) IBW/kg (Calculated) : 61.6 Heparin Dosing Weight: 90.2  Vital Signs: Temp: 98.1 F (36.7 C) (10/04 1811) Temp Source: Oral (10/04 1811) BP: 100/72 (10/04 1804) Pulse Rate: 105 (10/04 1804)  Labs: Recent Labs    10/26/2017 1831  HGB 12.1  HCT 37.6  PLT 237  CREATININE 8.21*  TROPONINI 0.75*    Estimated Creatinine Clearance: 10.8 mL/min (A) (by C-G formula based on SCr of 8.21 mg/dL (H)).   Medical History: Past Medical History:  Diagnosis Date  . Anemia   . Anxiety   . Cervical cancer (HCC)    Cervical and ovarian  . DVT (deep venous thrombosis) ([place])   . History of kidney stones   . Hypertension    treated years ago, on no medications for at least years ( as of 2019)  . Renal disorder    ESRD - stage 5 Dialysis T/Th/Sa  . Renal insufficiency      Assessment: 52 y.o. female who had one brief episode of chest pain with shortness of breath it was pleuritic this morning that went away spontaneously and then she was walking to get into [employer] today and developed sudden fairly severe central chest pain worse with deep breathing and exertion dizziness and shortness of breath.  Goal of Therapy:  Heparin level 0.3-0.7 units/ml Monitor platelets by anticoagulation protocol: Yes   Plan:  Give 4500 units bolus x 1 Start heparin infusion at 1450 units/hr Will check heparin level every 8 hours and then daily. Continue to monitor H&H and platelets  Forrest Moron, PharmD 10/24/2017,7:23 PM

## 2017-10-08 ENCOUNTER — Inpatient Hospital Stay: Payer: Medicare Other

## 2017-10-08 DIAGNOSIS — R079 Chest pain, unspecified: Secondary | ICD-10-CM

## 2017-10-08 LAB — BASIC METABOLIC PANEL
Anion gap: 17 — ABNORMAL HIGH (ref 5–15)
BUN: 42 mg/dL — ABNORMAL HIGH (ref 6–20)
CHLORIDE: 98 mmol/L (ref 98–111)
CO2: 23 mmol/L (ref 22–32)
CREATININE: 9.15 mg/dL — AB (ref 0.44–1.00)
Calcium: 8.9 mg/dL (ref 8.9–10.3)
GFR calc non Af Amer: 4 mL/min — ABNORMAL LOW (ref >60)
GFR, EST AFRICAN AMERICAN: 5 mL/min — AB (ref >60)
Glucose, Bld: 111 mg/dL — ABNORMAL HIGH (ref 70–99)
POTASSIUM: 5 mmol/L (ref 3.5–5.1)
SODIUM: 138 mmol/L (ref 135–145)

## 2017-10-08 LAB — MRSA PCR SCREENING: MRSA by PCR: NEGATIVE

## 2017-10-08 LAB — HEPARIN LEVEL (UNFRACTIONATED)
Heparin Unfractionated: 1.38 IU/mL — ABNORMAL HIGH (ref 0.30–0.70)
Heparin Unfractionated: 0.88 IU/mL — ABNORMAL HIGH (ref 0.30–0.70)
Heparin Unfractionated: 0.73 IU/mL — ABNORMAL HIGH (ref 0.30–0.70)

## 2017-10-08 LAB — PHOSPHORUS: PHOSPHORUS: 9.6 mg/dL — AB (ref 2.5–4.6)

## 2017-10-08 LAB — CBC
HEMATOCRIT: 35 % (ref 35.0–47.0)
HEMOGLOBIN: 11.4 g/dL — AB (ref 12.0–16.0)
MCH: 31.8 pg (ref 26.0–34.0)
MCHC: 32.6 g/dL (ref 32.0–36.0)
MCV: 97.6 fL (ref 80.0–100.0)
Platelets: 227 10*3/uL (ref 150–440)
RBC: 3.59 MIL/uL — AB (ref 3.80–5.20)
RDW: 17.4 % — ABNORMAL HIGH (ref 11.5–14.5)
WBC: 6.2 10*3/uL (ref 3.6–11.0)

## 2017-10-08 MED ORDER — CHLORHEXIDINE GLUCONATE CLOTH 2 % EX PADS: 6.0000 | MEDICATED_PAD | Freq: Every day | CUTANEOUS | Status: DC

## 2017-10-08 MED ORDER — LIDOCAINE HCL (PF) 1 % IJ SOLN: 5.0000 mL | INTRAMUSCULAR | Status: DC | PRN

## 2017-10-08 MED ORDER — HEPARIN (PORCINE) IN NACL 100-0.45 UNIT/ML-% IJ SOLN
800.0000 [IU]/h | INTRAMUSCULAR | Status: DC
  Administered 2017-10-08 (×2): 1100 [IU]/h via INTRAVENOUS
  Filled 2017-10-08: qty 250

## 2017-10-08 MED ORDER — LIDOCAINE-PRILOCAINE 2.5-2.5 % EX CREA: 1.0000 "application " | TOPICAL_CREAM | CUTANEOUS | Status: DC | PRN

## 2017-10-08 MED ORDER — SODIUM CHLORIDE 0.9 % IV SOLN: 100.0000 mL | INTRAVENOUS | Status: DC | PRN

## 2017-10-08 MED ORDER — HEPARIN SODIUM (PORCINE) 1000 UNIT/ML DIALYSIS: 1000.0000 [IU] | INTRAMUSCULAR | Status: DC | PRN

## 2017-10-08 MED ORDER — PENTAFLUOROPROP-TETRAFLUOROETH EX AERO
1.0000 "application " | INHALATION_SPRAY | CUTANEOUS | Status: DC | PRN
  Filled 2017-10-08: qty 30

## 2017-10-08 MED ORDER — ALTEPLASE 2 MG IJ SOLR: 2.0000 mg | Freq: Once | INTRAMUSCULAR | Status: DC | PRN

## 2017-10-08 NOTE — Progress Notes (Addendum)
Pt returns from HD, B/P 84/53, HR 91, SPO2 87 on R/A. Patient symptomatic. Pt reports dizziness. Pt placed on 2L via N/C, Dr. Bridgett Larsson notified of low BP, per Dr. Bridgett Larsson lay patient down flat and recheck BP in 30 mins, If bp< 80 give 250 cc bolus.  18:15 -Update BP has improved, BP; 97/59 HR; 89. Will continue to monitor and pass on to night nurse.

## 2017-10-08 NOTE — Consult Note (Signed)
ANTICOAGULATION CONSULT NOTE - Initial Consult  Pharmacy Consult for heparin infusion Indication: pulmonary embolus  Allergies  Allergen Reactions  . Ciprofloxacin Other (See Comments)    IV only-burning to her veins, can take PO  . Erythromycin Anaphylaxis  . Chlorhexidine Rash  . Other Rash and Other (See Comments)    chloraprep    Patient Measurements: Height: 5\' 7"  (170.2 cm) Weight: 266 lb 9.6 oz (120.9 kg) IBW/kg (Calculated) : 61.6 Heparin Dosing Weight: 90.2  Vital Signs: Temp: 97.9 F (36.6 C) (10/05 1935) Temp Source: Oral (10/05 1935) BP: 88/59 (10/05 1935) Pulse Rate: 98 (10/05 1935)  Labs: Recent Labs    10/31/2017 1831 10/28/2017 1904 10/08/17 0326 10/08/17 1320 10/08/17 2100  HGB 12.1  --  11.4*  --   --   HCT 37.6  --  35.0  --   --   PLT 237  --  227  --   --   APTT  --  33  --   --   --   LABPROT  --  14.9  --   --   --   INR  --  1.18  --   --   --   HEPARINUNFRC  --   --  1.38* 0.88* 0.73*  CREATININE 8.21*  --  9.15*  --   --   TROPONINI 0.75*  --   --   --   --     Estimated Creatinine Clearance: 9.7 mL/min (A) (by C-G formula based on SCr of 9.15 mg/dL (H)).   Medical History: Past Medical History:  Diagnosis Date  . Anemia   . Anxiety   . Cervical cancer (HCC)    Cervical and ovarian  . DVT (deep venous thrombosis) ([place])   . History of kidney stones   . Hypertension    treated years ago, on no medications for at least years ( as of 2019)  . Renal disorder    ESRD - stage 5 Dialysis T/Th/Sa  . Renal insufficiency      Assessment: 52 y.o. female who had one brief episode of chest pain with shortness of breath it was pleuritic this morning that went away spontaneously and then she was walking to get into [employer] today and developed sudden fairly severe central chest pain worse with deep breathing and exertion dizziness and shortness of breath.  Goal of Therapy:  Heparin level 0.3-0.7 units/ml Monitor platelets by anticoagulation  protocol: Yes   Plan:  Give 4500 units bolus x 1 Start heparin infusion at 1450 units/hr Will check heparin level every 8 hours and then daily. Continue to monitor H&H and platelets  10/05 0330 heparin level 1.38. Hold drip x 1 hour and restart at 1100 units/hr. Next heparin level 8 hours after restart.  10/5 1320 HL @ 0.88. Will reduce heparin gtt rate to 900 units/hr. Will recheck Heparin level @ 2100  10/05 2100 HL @ 0.73 Will reduce heparin rate to 800 units/hr and will recheck heparin level in 8 hours.  Forrest Moron, PharmD 10/08/2017,9:40 PM

## 2017-10-08 NOTE — Progress Notes (Signed)
This note also relates to the following rows which could not be included: Pulse Rate - Cannot attach notes to unvalidated device data Resp - Cannot attach notes to unvalidated device data  Hd started  

## 2017-10-08 NOTE — Consult Note (Signed)
ANTICOAGULATION CONSULT NOTE - Initial Consult  Pharmacy Consult for heparin infusion Indication: pulmonary embolus  Allergies  Allergen Reactions  . Ciprofloxacin Other (See Comments)    IV only-burning to her veins, can take PO  . Erythromycin Anaphylaxis  . Chlorhexidine Rash  . Other Rash and Other (See Comments)    chloraprep    Patient Measurements: Height: 5\' 7"  (170.2 cm) Weight: 265 lb 6.9 oz (120.4 kg) IBW/kg (Calculated) : 61.6 Heparin Dosing Weight: 90.2  Vital Signs: Temp: 98 F (36.7 C) (10/05 1250) Temp Source: Oral (10/05 1250) BP: 96/65 (10/05 1430) Pulse Rate: 94 (10/05 1430)  Labs: Recent Labs    10/26/2017 1831 10/16/2017 1904 10/08/17 0326 10/08/17 1320  HGB 12.1  --  11.4*  --   HCT 37.6  --  35.0  --   PLT 237  --  227  --   APTT  --  33  --   --   LABPROT  --  14.9  --   --   INR  --  1.18  --   --   HEPARINUNFRC  --   --  1.38* 0.88*  CREATININE 8.21*  --  9.15*  --   TROPONINI 0.75*  --   --   --     Estimated Creatinine Clearance: 9.7 mL/min (A) (by C-G formula based on SCr of 9.15 mg/dL (H)).   Medical History: Past Medical History:  Diagnosis Date  . Anemia   . Anxiety   . Cervical cancer (HCC)    Cervical and ovarian  . DVT (deep venous thrombosis) ([place])   . History of kidney stones   . Hypertension    treated years ago, on no medications for at least years ( as of 2019)  . Renal disorder    ESRD - stage 5 Dialysis T/Th/Sa  . Renal insufficiency      Assessment: 52 y.o. female who had one brief episode of chest pain with shortness of breath it was pleuritic this morning that went away spontaneously and then she was walking to get into [employer] today and developed sudden fairly severe central chest pain worse with deep breathing and exertion dizziness and shortness of breath.  Goal of Therapy:  Heparin level 0.3-0.7 units/ml Monitor platelets by anticoagulation protocol: Yes   Plan:  Give 4500 units bolus x 1 Start  heparin infusion at 1450 units/hr Will check heparin level every 8 hours and then daily. Continue to monitor H&H and platelets  10/05 0330 heparin level 1.38. Hold drip x 1 hour and restart at 1100 units/hr. Next heparin level 8 hours after restart.  10/5 1320 HL @ 0.88. Will reduce heparin gtt rate to 900 units/hr. Will recheck Heparin level @ 2100  Larene Beach, PharmD 10/08/2017,2:35 PM

## 2017-10-08 NOTE — Progress Notes (Signed)
[facility] [place] NAME: Judy Kelley    MR#:  [id number]  DATE OF BIRTH:  12/29/1965  SUBJECTIVE:  CHIEF COMPLAINT:   Chief Complaint  Patient presents with  . Chest Pain   The patient has shortness of breath and chest pain while breathing, she is on dialysis.  BP is low. REVIEW OF SYSTEMS:  Review of Systems  Constitutional: Positive for malaise/fatigue. Negative for chills and fever.  HENT: Negative for sore throat.   Eyes: Negative for blurred vision and double vision.  Respiratory: Positive for cough and shortness of breath. Negative for hemoptysis, sputum production, wheezing and stridor.   Cardiovascular: Negative for chest pain, palpitations, orthopnea and leg swelling.  Gastrointestinal: Negative for abdominal pain, blood in stool, diarrhea, melena, nausea and vomiting.  Genitourinary: Negative for dysuria, flank pain and hematuria.  Musculoskeletal: Negative for back pain and joint pain.  Neurological: Negative for dizziness, sensory change, focal weakness, seizures, loss of consciousness, weakness and headaches.  Endo/Heme/Allergies: Negative for polydipsia.  Psychiatric/Behavioral: Negative for depression. The patient is not nervous/anxious.     DRUG ALLERGIES:   Allergies  Allergen Reactions  . Ciprofloxacin Other (See Comments)    IV only-burning to her veins, can take PO  . Erythromycin Anaphylaxis  . Chlorhexidine Rash  . Other Rash and Other (See Comments)    chloraprep   VITALS:  Blood pressure (!) 95/59, pulse 94, temperature 97.6 F (36.4 C), temperature source Oral, resp. rate 16, height 5\' 7"  (1.702 m), weight 120.4 kg, SpO2 (!) 88 %. PHYSICAL EXAMINATION:  Physical Exam  Constitutional: She is oriented to person, place, and time. No distress.  Morbid obesity.  HENT:  Head: Normocephalic.  Mouth/Throat: Oropharynx is clear and moist.  Eyes: Pupils are equal, round, and reactive to light.  Conjunctivae and EOM are normal. No scleral icterus.  Neck: Normal range of motion. Neck supple. No JVD present. No tracheal deviation present.  Cardiovascular: Normal rate, regular rhythm and normal heart sounds. Exam reveals no gallop.  No murmur heard. Pulmonary/Chest: Effort normal and breath sounds normal. No respiratory distress. She has no wheezes. She has no rales.  Abdominal: Soft. Bowel sounds are normal. She exhibits no distension. There is no tenderness. There is no rebound.  Musculoskeletal: Normal range of motion. She exhibits no edema or tenderness.  Neurological: She is alert and oriented to person, place, and time. No cranial nerve deficit.  Skin: No rash noted. No erythema.  Psychiatric: She has a normal mood and affect.   LABORATORY PANEL:  Female CBC Recent Labs  Lab 10/08/17 0326  WBC 6.2  HGB 11.4*  HCT 35.0  PLT 227   ------------------------------------------------------------------------------------------------------------------ Chemistries  Recent Labs  Lab 10/11/2017 1831 10/08/17 0326  NA 138 138  K 5.1 5.0  CL 97* 98  CO2 24 23  GLUCOSE 141* 111*  BUN 36* 42*  CREATININE 8.21* 9.15*  CALCIUM 9.0 8.9  AST 29  --   ALT 10  --   ALKPHOS 65  --   BILITOT 0.5  --    RADIOLOGY:  Ct Angio Chest Pe W And/or Wo Contrast  Result Date: 10/12/2017 CLINICAL DATA:  Acute onset chest pain and dizziness today. EXAM: CT ANGIOGRAPHY CHEST WITH CONTRAST TECHNIQUE: Multidetector CT imaging of the chest was performed using the standard protocol during bolus administration of intravenous contrast. Multiplanar CT image reconstructions and MIPs were obtained to evaluate the vascular anatomy. CONTRAST:  75 mL OMNIPAQUE  IOHEXOL 350 MG/ML SOLN COMPARISON:  Single-view of the chest this same day. FINDINGS: Cardiovascular: Pulmonary emboli are seen bilaterally. The RV to LV ratio is 2.1 compatible with right heart strain. The right ventricle is bowed into the left ventricle.  Cardiomegaly is identified. No pericardial effusion. No aortic aneurysm. Bilateral central venous catheters are in place with their tips in the lower superior vena cava and just within the right atrium. Mediastinum/Nodes: No enlarged mediastinal, hilar, or axillary lymph nodes. Thyroid gland, trachea, and esophagus demonstrate no significant findings. Lungs/Pleura: No pleural effusion. The lungs demonstrate only mild dependent atelectasis. Upper Abdomen: Negative. Musculoskeletal: Negative. Review of the MIP images confirms the above findings. IMPRESSION: Positive for acute PE with CT evidence of right heart strain (RV/LV Ratio = 2.1) consistent with at least submassive (intermediate risk) PE. The presence of right heart strain has been associated with an increased risk of morbidity and mortality. Please activate Code PE by paging [phone]. Critical Value/emergent results were called by telephone at the time of interpretation on 10/23/2017 at 6:57 pm to Dr. Conni Slipper , who verbally acknowledged these results. Electronically Signed   By: Inge Rise M.D.   On: 10/28/2017 19:00   US Venous Img Lower Bilateral  Result Date: 10/08/2017 CLINICAL DATA:  52 year old female with pulmonary embolism EXAM: BILATERAL LOWER EXTREMITY VENOUS DOPPLER ULTRASOUND TECHNIQUE: Gray-scale sonography with graded compression, as well as color Doppler and duplex ultrasound were performed to evaluate the lower extremity deep venous systems from the level of the common femoral vein and including the common femoral, femoral, profunda femoral, popliteal and calf veins including the posterior tibial, peroneal and gastrocnemius veins when visible. The superficial great saphenous vein was also interrogated. Spectral Doppler was utilized to evaluate flow at rest and with distal augmentation maneuvers in the common femoral, femoral and popliteal veins. COMPARISON:  CT abdomen 06/11/2017 FINDINGS: RIGHT LOWER EXTREMITY Common Femoral  Vein: No evidence of thrombus. Normal compressibility. Blunted phasicity. Saphenofemoral Junction: No evidence of thrombus. Normal compressibility and flow on color Doppler imaging. Profunda Femoral Vein: No evidence of thrombus. Normal compressibility and flow on color Doppler imaging. Femoral Vein: No evidence of thrombus. Normal compressibility, respiratory phasicity and response to augmentation. Popliteal Vein: No evidence of thrombus. Normal compressibility, respiratory phasicity and response to augmentation. Calf Veins: No evidence of thrombus. Normal compressibility and flow on color Doppler imaging. Superficial Great Saphenous Vein: No evidence of thrombus. Normal compressibility and flow on color Doppler imaging. Other Findings:  None. LEFT LOWER EXTREMITY Common Femoral Vein: Noncompressible with flow maintained and evidence of chronic DVT. Blunted phasicity with venous hum appearance of the duplex. Saphenofemoral Junction: No evidence of thrombus. Normal compressibility and flow on color Doppler imaging. Profunda Femoral Vein: No evidence of thrombus. Normal compressibility and flow on color Doppler imaging. Femoral Vein: Noncompressible with flow maintained. Blunted phasicity. Popliteal Vein: Noncompressible with flow maintained. Blunted phasicity Calf Veins: No evidence of thrombus. Normal compressibility and flow on color Doppler imaging. Superficial Great Saphenous Vein: No evidence of thrombus. Normal compressibility and flow on color Doppler imaging. Venous Reflux:  None. Other Findings:  None. IMPRESSION: Right: Sonographic survey of the right negative for DVT. Blunted phasicity may indicate more proximal venous occlusion/stenosis. Left: Sonographic survey of the left is positive for nonocclusive proximal DVT of the common femoral vein, femoral vein, and popliteal vein. 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 Electronically Signed  By: Corrie Mckusick D.O.   On: 10/08/2017 12:31   Dg Chest Portable 1 View  Result Date: 10/08/2017 CLINICAL DATA:  Chest pain and short of breath EXAM: PORTABLE CHEST 1 VIEW COMPARISON:  09/03/2017 FINDINGS: Right-sided central venous catheter tip over the SVC. Left-sided central venous catheter tip over the mid right atrium. Mild cardiomegaly with slight central congestion. No focal opacity or pleural effusion. No pneumothorax. Vascular stents in the right upper extremity. IMPRESSION: 1. Bilateral central venous catheters, on the right tip projects over the distal SVC, on the left hip projects over mid right atrium 2. Mild cardiomegaly with central vascular congestion. Electronically Signed   By: Donavan Foil M.D.   On: 10/16/2017 18:30   ASSESSMENT AND PLAN:   *Acute pulmonary embolus with right heart strain Occurring while on Eliquis, history of DVT. Continue heparin drip for now, pain protocol, supplemental oxygen with weaning as tolerated. Venous duplex showed left side nonocclusive DVT.  Follow-up echocardiograph. 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 Vascular surgery consult for possible IVC filter.  *Chronic end-stage renal disease On hemodialysis T/TH/SAT Nephrology consulted for hemodialysis needs  *Chronic anxiety, NOS Stable continue Zoloft  *History of kidney stones Stable  Hypotension.  Blood pressure is better after hemodialysis.  Morbid obesity. All the records are reviewed and case discussed with Care Management/Social Worker. Management plans discussed with the patient, family and they are in agreement.  CODE STATUS: Full Code  TOTAL TIME TAKING CARE OF THIS PATIENT: 36 minutes.   More than 50% of the time was spent in counseling/coordination of care: YES  POSSIBLE D/C IN 2 DAYS, DEPENDING ON CLINICAL CONDITION.   Demetrios Loll M.D on 10/08/2017 at 5:06 PM  Between 7am to 6pm - Pager - [phone]  After  6pm go to [email or web address] - [facility]

## 2017-10-08 NOTE — Consult Note (Signed)
ANTICOAGULATION CONSULT NOTE - Initial Consult  Pharmacy Consult for heparin infusion Indication: pulmonary embolus  Allergies  Allergen Reactions  . Ciprofloxacin Other (See Comments)    IV only-burning to her veins, can take PO  . Erythromycin Anaphylaxis  . Chlorhexidine Rash  . Other Rash and Other (See Comments)    chloraprep    Patient Measurements: Height: 5\' 7"  (170.2 cm) Weight: 264 lb 8 oz (120 kg) IBW/kg (Calculated) : 61.6 Heparin Dosing Weight: 90.2  Vital Signs: Temp: 98.2 F (36.8 C) (10/05 0335) Temp Source: Oral (10/05 0335) BP: 104/74 (10/05 0335) Pulse Rate: 86 (10/05 0335)  Labs: Recent Labs    10/29/2017 1831 10/20/2017 1904 10/08/17 0326  HGB 12.1  --  11.4*  HCT 37.6  --  35.0  PLT 237  --  227  APTT  --  33  --   LABPROT  --  14.9  --   INR  --  1.18  --   HEPARINUNFRC  --   --  1.38*  CREATININE 8.21*  --  9.15*  TROPONINI 0.75*  --   --     Estimated Creatinine Clearance: 9.7 mL/min (A) (by C-G formula based on SCr of 9.15 mg/dL (H)).   Medical History: Past Medical History:  Diagnosis Date  . Anemia   . Anxiety   . Cervical cancer (HCC)    Cervical and ovarian  . DVT (deep venous thrombosis) ([place])   . History of kidney stones   . Hypertension    treated years ago, on no medications for at least years ( as of 2019)  . Renal disorder    ESRD - stage 5 Dialysis T/Th/Sa  . Renal insufficiency      Assessment: 52 y.o. female who had one brief episode of chest pain with shortness of breath it was pleuritic this morning that went away spontaneously and then she was walking to get into [employer] today and developed sudden fairly severe central chest pain worse with deep breathing and exertion dizziness and shortness of breath.  Goal of Therapy:  Heparin level 0.3-0.7 units/ml Monitor platelets by anticoagulation protocol: Yes   Plan:  Give 4500 units bolus x 1 Start heparin infusion at 1450 units/hr Will check heparin level every  8 hours and then daily. Continue to monitor H&H and platelets  10/05 0330 heparin level 1.38. Hold drip x 1 hour and restart at 1100 units/hr. Next heparin level 8 hours after restart.  Rishith Siddoway S, PharmD 10/08/2017,4:20 AM

## 2017-10-08 NOTE — Consult Note (Signed)
Reason for Consult: DVT/pulmonary embolism Referring Physician: Dr. Newton [name] is an 52 y.o. female.  HPI: Judy Kelley is a very pleasant 52 year old female with a past medical history remarkable for end-stage renal disease on hemodialysis, multiple clotted vascular access, hypertension, nephrolithiasis, prior history of cervical cancer, status post radiation therapy with ureteral stenosis with subsequent renal failure, anemia, anxiety, prior history of DVT x2, initially treated with Coumadin and subsequently on Eliquis, presented with acute onset of shortness of breath, dizziness, chest discomfort with pleuritic nature, CT scan of the chest revealed bilateral pulmonary embolism with some evidence of right ventricular strain, stable hemodynamics and oxygenation, was started on IV heparin and admitted to the hospital for further evaluation and treatment.  Patient received hemodialysis today.  States that she is doing okay right now but has limited exertional dyspnea.  She denies any hemoptysis.  She does complain of some chest pain with deep inspiration  Past Medical History:  Diagnosis Date  . Anemia   . Anxiety   . Cervical cancer (HCC)    Cervical and ovarian  . DVT (deep venous thrombosis) ([place])   . History of kidney stones   . Hypertension    treated years ago, on no medications for at least years ( as of 2019)  . Renal disorder    ESRD - stage 5 Dialysis T/Th/Sa  . Renal insufficiency     Past Surgical History:  Procedure Laterality Date  . AV FISTULA PLACEMENT Right 03/21/2017   Procedure: INSERTION OF ARTERIOVENOUS (AV) GORE-TEX GRAFT RIGHT THIGH;  Surgeon: Angelia Mould, MD;  Location: [facility];  Service: Vascular;  Laterality: Right;  . CYSTOSCOPY N/A 12/09/2016   Procedure: ENDOSCOPY OF ILEAL CONDUIT;  Surgeon: Irine Seal, MD;  Location: [facility];  Service: Urology;  Laterality: N/A;  . GRAFT APPLICATION Right 24/40/1027   INSERTION OF ARTERIOVENOUS (AV) GORE-TEX  GRAFT RIGHT THIGH   . ileal conduit    . IR FLUORO GUIDE CV LINE LEFT  06/15/2017  . IR FLUORO GUIDE CV LINE RIGHT  12/09/2016  . IR FLUORO GUIDE CV LINE RIGHT  06/07/2017  . IR NEPHROSTOMY EXCHANGE RIGHT  01/21/2017  . IR NEPHROSTOMY EXCHANGE RIGHT  04/13/2017  . IR NEPHROSTOMY EXCHANGE RIGHT  07/06/2017  . IR NEPHROSTOMY EXCHANGE RIGHT  07/28/2017  . IR PTA VENOUS EXCEPT DIALYSIS CIRCUIT  06/07/2017  . IR REMOVAL TUN CV CATH W/O FL  06/29/2017  . IR THROMBECTOMY AV FISTULA W/THROMBOLYSIS/PTA/STENT INC/SHUNT/IMG RT Right 12/09/2016  . IR US GUIDE VASC ACCESS LEFT  06/15/2017  . IR US GUIDE VASC ACCESS RIGHT  12/09/2016  . IR US GUIDE VASC ACCESS RIGHT  12/09/2016  . IR VENOCAVAGRAM IVC  06/07/2017  . nephrosotomy    . TONSILLECTOMY    . TUBAL LIGATION      Family History  Problem Relation Age of Onset  . Diabetes Mellitus II Mother   . Kidney disease Mother   . Heart disease Mother   . Rheum arthritis Mother   . COPD Mother   . Diabetes Mellitus II Father   . Heart disease Father   . Dementia Father   . Ovarian cancer Maternal Aunt     Social History:  reports that she has never smoked. She has never used smokeless tobacco. She reports that she drinks alcohol. She reports that she does not use drugs.  Allergies:  Allergies  Allergen Reactions  . Ciprofloxacin Other (See Comments)    IV only-burning to her veins,  can take PO  . Erythromycin Anaphylaxis  . Chlorhexidine Rash  . Other Rash and Other (See Comments)    chloraprep    Medications: I have reviewed the patient's current medications.  Results for orders placed or performed during the hospital encounter of 10/23/2017 (from the past 48 hour(s))  Comprehensive metabolic panel     Status: Abnormal   Collection Time: 10/17/2017  6:31 PM  Result Value Ref Range   Sodium 138 135 - 145 mmol/L   Potassium 5.1 3.5 - 5.1 mmol/L   Chloride 97 (L) 98 - 111 mmol/L   CO2 24 22 - 32 mmol/L   Glucose, Bld 141 (H) 70 - 99 mg/dL   BUN 36 (H)  6 - 20 mg/dL   Creatinine, Ser 8.21 (H) 0.44 - 1.00 mg/dL   Calcium 9.0 8.9 - 10.3 mg/dL   Total Protein 8.0 6.5 - 8.1 g/dL   Albumin 4.1 3.5 - 5.0 g/dL   AST 29 15 - 41 U/L   ALT 10 0 - 44 U/L   Alkaline Phosphatase 65 38 - 126 U/L   Total Bilirubin 0.5 0.3 - 1.2 mg/dL   GFR calc non Af Amer 5 (L) >60 mL/min   GFR calc Af Amer 6 (L) >60 mL/min    Comment: (NOTE) The eGFR has been calculated using the CKD EPI equation. This calculation has not been validated in all clinical situations. eGFR's persistently <60 mL/min signify possible Chronic Kidney Disease.    Anion gap 17 (H) 5 - 15    Comment: Performed at [facility], [address] [place], [place] [address]  Troponin I     Status: Abnormal   Collection Time: 10/29/2017  6:31 PM  Result Value Ref Range   Troponin I 0.75 (HH) <0.03 ng/mL    Comment: CRITICAL RESULT CALLED TO, READ BACK BY AND VERIFIED WITH BILL SMITH 10/18/2017 1910 JML Performed at [facility], [address] [place], [place] [address]   CBC with Differential     Status: Abnormal   Collection Time: 10/27/2017  6:31 PM  Result Value Ref Range   WBC 7.6 3.6 - 11.0 K/uL   RBC 3.82 3.80 - 5.20 MIL/uL   Hemoglobin 12.1 12.0 - 16.0 g/dL   HCT 37.6 35.0 - 47.0 %   MCV 98.4 80.0 - 100.0 fL   MCH 31.7 26.0 - 34.0 pg   MCHC 32.2 32.0 - 36.0 g/dL   RDW 17.3 (H) 11.5 - 14.5 %   Platelets 237 150 - 440 K/uL   Neutrophils Relative % 80 %   Neutro Abs 6.1 1.4 - 6.5 K/uL   Lymphocytes Relative 11 %   Lymphs Abs 0.8 (L) 1.0 - 3.6 K/uL   Monocytes Relative 7 %   Monocytes Absolute 0.5 0.2 - 0.9 K/uL   Eosinophils Relative 1 %   Eosinophils Absolute 0.1 0 - 0.7 K/uL   Basophils Relative 1 %   Basophils Absolute 0.1 0 - 0.1 K/uL    Comment: Performed at [facility], [address] [place], [place] [address]  Fibrin derivatives D-Dimer     Status: Abnormal   Collection Time: 10/13/2017  6:31 PM  Result Value Ref Range   Fibrin derivatives  D-dimer ([facility]) 4,106.57 (H) 0.00 - 499.00 ng/mL (FEU)    Comment: (NOTE) <> Exclusion of Venous Thromboembolism (VTE) - OUTPATIENT ONLY   (Emergency Department or [place])   0-499 ng/ml (FEU): With a low to intermediate pretest probability  for VTE this test result excludes the diagnosis                      of VTE.   >499 ng/ml (FEU) : VTE not excluded; additional work up for VTE is                      required. <> Testing on Inpatients and Evaluation of Disseminated Intravascular   Coagulation (DIC) Reference Range:   0-499 ng/ml (FEU) Performed at [facility], [address] [place], [place] [address]   Brain natriuretic peptide     Status: Abnormal   Collection Time: 10/17/2017  6:56 PM  Result Value Ref Range   B Natriuretic Peptide 1,307.0 (H) 0.0 - 100.0 pg/mL    Comment: Performed at [facility], [address] [place], [place] [address]  Protime-INR     Status: None   Collection Time: 10/06/2017  7:04 PM  Result Value Ref Range   Prothrombin Time 14.9 11.4 - 15.2 seconds   INR 1.18     Comment: Performed at [facility], [address] [place], [place] [address]  APTT     Status: None   Collection Time: 10/10/2017  7:04 PM  Result Value Ref Range   aPTT 33 24 - 36 seconds    Comment: Performed at [facility], [address], [place], [place] [address]  MRSA PCR Screening     Status: None   Collection Time: 10/18/2017 11:03 PM  Result Value Ref Range   MRSA by PCR NEGATIVE NEGATIVE    Comment:        The GeneXpert MRSA Assay (FDA approved for NASAL specimens only), is one component of a comprehensive MRSA colonization surveillance program. It is not intended to diagnose MRSA infection nor to guide or monitor treatment for MRSA infections. Performed at [facility], [place], [place] [address]   Heparin level (unfractionated)     Status: Abnormal   Collection Time: 10/08/17  3:26  AM  Result Value Ref Range   Heparin Unfractionated 1.38 (H) 0.30 - 0.70 IU/mL    Comment: (NOTE) If heparin results are below expected values, and patient dosage has  been confirmed, suggest follow up testing of antithrombin III levels. Performed at [facility], [address] [place], [place] [address]   CBC     Status: Abnormal   Collection Time: 10/08/17  3:26 AM  Result Value Ref Range   WBC 6.2 3.6 - 11.0 K/uL   RBC 3.59 (L) 3.80 - 5.20 MIL/uL   Hemoglobin 11.4 (L) 12.0 - 16.0 g/dL   HCT 35.0 35.0 - 47.0 %   MCV 97.6 80.0 - 100.0 fL   MCH 31.8 26.0 - 34.0 pg   MCHC 32.6 32.0 - 36.0 g/dL   RDW 17.4 (H) 11.5 - 14.5 %   Platelets 227 150 - 440 K/uL    Comment: Performed at [facility], [address], [place],  [address]  Basic metabolic panel     Status: Abnormal   Collection Time: 10/08/17  3:26 AM  Result Value Ref Range   Sodium 138 135 - 145 mmol/L   Potassium 5.0 3.5 - 5.1 mmol/L   Chloride 98 98 - 111 mmol/L   CO2 23 22 - 32 mmol/L   Glucose, Bld 111 (H) 70 - 99 mg/dL   BUN 42 (H) 6 - 20 mg/dL   Creatinine,  Ser 9.15 (H) 0.44 - 1.00 mg/dL   Calcium 8.9 8.9 - 10.3 mg/dL   GFR calc non Af Amer 4 (L) >60 mL/min   GFR calc Af Amer 5 (L) >60 mL/min    Comment: (NOTE) The eGFR has been calculated using the CKD EPI equation. This calculation has not been validated in all clinical situations. eGFR's persistently <60 mL/min signify possible Chronic Kidney Disease.    Anion gap 17 (H) 5 - 15    Comment: Performed at [facility], [place], [place] [address]  Heparin level (unfractionated)     Status: Abnormal   Collection Time: 10/08/17  1:20 PM  Result Value Ref Range   Heparin Unfractionated 0.88 (H) 0.30 - 0.70 IU/mL    Comment: (NOTE) If heparin results are below expected values, and patient dosage has  been confirmed, suggest follow up testing of antithrombin III levels. Performed at [facility],  [address] [place], [place] [address]   Phosphorus     Status: Abnormal   Collection Time: 10/08/17  1:20 PM  Result Value Ref Range   Phosphorus 9.6 (H) 2.5 - 4.6 mg/dL    Comment: Performed at [facility], [place], [place] [address]    Ct Angio Chest Pe W And/or Wo Contrast  Result Date: 10/31/2017 CLINICAL DATA:  Acute onset chest pain and dizziness today. EXAM: CT ANGIOGRAPHY CHEST WITH CONTRAST TECHNIQUE: Multidetector CT imaging of the chest was performed using the standard protocol during bolus administration of intravenous contrast. Multiplanar CT image reconstructions and MIPs were obtained to evaluate the vascular anatomy. CONTRAST:  75 mL OMNIPAQUE IOHEXOL 350 MG/ML SOLN COMPARISON:  Single-view of the chest this same day. FINDINGS: Cardiovascular: Pulmonary emboli are seen bilaterally. The RV to LV ratio is 2.1 compatible with right heart strain. The right ventricle is bowed into the left ventricle. Cardiomegaly is identified. No pericardial effusion. No aortic aneurysm. Bilateral central venous catheters are in place with their tips in the lower superior vena cava and just within the right atrium. Mediastinum/Nodes: No enlarged mediastinal, hilar, or axillary lymph nodes. Thyroid gland, trachea, and esophagus demonstrate no significant findings. Lungs/Pleura: No pleural effusion. The lungs demonstrate only mild dependent atelectasis. Upper Abdomen: Negative. Musculoskeletal: Negative. Review of the MIP images confirms the above findings. IMPRESSION: Positive for acute PE with CT evidence of right heart strain (RV/LV Ratio = 2.1) consistent with at least submassive (intermediate risk) PE. The presence of right heart strain has been associated with an increased risk of morbidity and mortality. Please activate Code PE by paging [phone]. Critical Value/emergent results were called by telephone at the time of interpretation on 10/06/2017 at 6:57 pm to Dr. Conni Slipper , who verbally acknowledged these results. Electronically Signed   By: Inge Rise M.D.   On: 10/06/2017 19:00   US Venous Img Lower Bilateral  Result Date: 10/08/2017 CLINICAL DATA:  52 year old female with pulmonary embolism EXAM: BILATERAL LOWER EXTREMITY VENOUS DOPPLER ULTRASOUND TECHNIQUE: Gray-scale sonography with graded compression, as well as color Doppler and duplex ultrasound were performed to evaluate the lower extremity deep venous systems from the level of the common femoral vein and including the common femoral, femoral, profunda femoral, popliteal and calf veins including the posterior tibial, peroneal and gastrocnemius veins when visible. The superficial great saphenous vein was also interrogated. Spectral Doppler was utilized to evaluate flow at rest and with distal augmentation maneuvers in the common femoral, femoral and popliteal veins. COMPARISON:  CT abdomen 06/11/2017 FINDINGS: RIGHT LOWER EXTREMITY Common Femoral Vein: No evidence of thrombus. Normal compressibility. Blunted phasicity. Saphenofemoral Junction: No evidence of thrombus. Normal compressibility and flow on color Doppler imaging. Profunda Femoral Vein: No evidence of thrombus. Normal compressibility and flow on color Doppler imaging. Femoral Vein: No evidence of thrombus. Normal compressibility, respiratory phasicity and response to augmentation. Popliteal Vein: No evidence of thrombus. Normal compressibility, respiratory phasicity and response to augmentation. Calf Veins: No evidence of thrombus. Normal compressibility and flow on color Doppler imaging. Superficial Great Saphenous Vein: No evidence of thrombus. Normal compressibility and flow on color Doppler imaging. Other Findings:  None. LEFT LOWER EXTREMITY Common Femoral Vein: Noncompressible with flow maintained and evidence of chronic DVT. Blunted phasicity with venous hum appearance of the duplex. Saphenofemoral Junction: No evidence of thrombus. Normal  compressibility and flow on color Doppler imaging. Profunda Femoral Vein: No evidence of thrombus. Normal compressibility and flow on color Doppler imaging. Femoral Vein: Noncompressible with flow maintained. Blunted phasicity. Popliteal Vein: Noncompressible with flow maintained. Blunted phasicity Calf Veins: No evidence of thrombus. Normal compressibility and flow on color Doppler imaging. Superficial Great Saphenous Vein: No evidence of thrombus. Normal compressibility and flow on color Doppler imaging. Venous Reflux:  None. Other Findings:  None. IMPRESSION: Right: Sonographic survey of the right negative for DVT. Blunted phasicity may indicate more proximal venous occlusion/stenosis. Left: Sonographic survey of the left is positive for nonocclusive proximal DVT of the common femoral vein, femoral vein, and popliteal vein. 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 Electronically Signed   By: Corrie Mckusick D.O.   On: 10/08/2017 12:31   Dg Chest Portable 1 View  Result Date: 10/28/2017 CLINICAL DATA:  Chest pain and short of breath EXAM: PORTABLE CHEST 1 VIEW COMPARISON:  09/03/2017 FINDINGS: Right-sided central venous catheter tip over the SVC. Left-sided central venous catheter tip over the mid right atrium. Mild cardiomegaly with slight central congestion. No focal opacity or pleural effusion. No pneumothorax. Vascular stents in the right upper extremity. IMPRESSION: 1. Bilateral central venous catheters, on the right tip projects over the distal SVC, on the left hip projects over mid right atrium 2. Mild cardiomegaly with central vascular congestion. Electronically Signed   By: Donavan Foil M.D.   On: 10/27/2017 18:30    ROS   Please see HPI for review of systems all else negative  Blood pressure (!) 84/53, pulse 91, temperature 97.6 F (36.4 C), temperature source Oral, resp. rate 18, height 5' 7"   (1.702 m), weight 120.9 kg, SpO2 91 %. Physical Exam  Patient is awake, alert, communicating, in no acute distress HEENT: Trachea midline, no thyromegaly appreciated, no distention of neck veins, no oral lesions appreciated Cardiovascular: Regular rate and rhythm, soft systolic murmur appreciated at the left parasternal border Pulmonary: Clear to auscultation Abdominal: Positive bowel sounds, soft nontender Extremities: Some left asymmetrical swelling otherwise 1+ edema appreciated bilaterally Multiple prior clotted grafts and fistulas noted bilateral upper extremities Neurologic: Cranial nerves grossly intact with any focal deficits appreciated Psychiatric: Patient is situationally appropriate  Assessment/Plan:  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  Patient has had DVTs in the past, initially treated with Coumadin and subsequently Eliquis and has been compliant with therapy.  Venous ultrasound shows left lower extremity DVT and CT scan of the chest reveals bilateral central pulmonary emboli with evidence of right ventricular strain.  Patient is hemodynamically stable and on nasal  cannula.  Agree with IV heparin and not lytics at this time.  Concerning that the patient developed DVT and pulmonary emboli on Eliquis.  Would most certainly consult hematology oncology and get their recommendations for subsequent anticoagulation, questionable hypercoagulable state, Eliquis resistance.  Will obtain echocardiography and assess pulmonary artery pressures.  If unable to anticoagulate successfully may be a candidate for a filter placement.  Pending echocardiography and hematology consultation  Hermelinda Dellen, DO  Judy Kelley 10/08/2017, 5:36 PM

## 2017-10-08 NOTE — Progress Notes (Signed)
This note also relates to the following rows which could not be included: BP - Cannot attach notes to unvalidated device data  Hd completed, report given to Lorrine Kin RN, notified of patients low bp during dialysis treatment.  Instructed patient to stay in bed for at least 2 hours because of her bp.  Very little UF this treatment because of the Bp.  Patient moved back to her room alert and oriented X4 with no complaints other than being hungry.

## 2017-10-08 NOTE — Consult Note (Signed)
[facility] CONSULT NOTE    Date: 10/08/2017                  Patient Name:  Judy Kelley  MRN: [id number]  DOB: 10-Nov-1965  Age / Sex: 52 y.o., female         PCP: Patient, No Pcp Per                 Service Requesting Consult: Hospitalist                 Reason for Consult:  Evaluation management of end-stage renal disease            History of Present Illness: Patient is a 52 y.o. female with a PMHx of end-stage renal disease on hemodialysis TTS, hypertension, nephrolithiasis, DVT, cervical cancer, anxiety, anemia of chronic kidney disease, secondary hyperparathyroidism, who was admitted to [facility] on 10/14/2017 for evaluation of chest pain.  She also had associated shortness of breath, dizziness.  She was found to have acute submassive pulmonary embolism with right heart strain.  She was therefore admitted.  She undergoes dialysis on Tuesday, Thursday, Saturday.  She is followed by [facility] in [place] usually.  He appears to be maintained on cinacalcet as well as Fosrenol for her underlying secondary hyperparathyroidism.  Patient is due for hemodialysis today.   Medications: Outpatient medications: Medications Prior to Admission  Medication Sig Dispense Refill Last Dose  . apixaban (ELIQUIS) 5 MG TABS tablet Take 5 mg by mouth 2 (two) times daily.   10/14/2017 at 0800  . cinacalcet (SENSIPAR) 90 MG tablet Take 90 mg by mouth daily.   10/06/2017 at 2000  . gabapentin (NEURONTIN) 100 MG capsule Take 100 mg by mouth at bedtime.   10/06/2017 at 2000  . lanthanum (FOSRENOL) 1000 MG chewable tablet Chew 1 tablet (1,000 mg total) by mouth 3 (three) times daily with meals. 90 tablet 0 11/02/2017 at 0800  . loperamide (IMODIUM) 2 MG capsule Take 1 capsule (2 mg total) by mouth as needed for diarrhea or loose stools. 30 capsule 0 Unknown at PRN  . multivitamin (RENA-VIT) TABS tablet Take 1 tablet by mouth daily.   10/16/2017 at 0800  . mupirocin ointment (BACTROBAN)  2 % Apply 1 application topically 2 (two) times daily as needed (rash).    Unknown at PRN  . promethazine (PHENERGAN) 25 MG tablet Take 25 mg by mouth every 6 (six) hours as needed for nausea or vomiting.   Unknown at PRN  . sertraline (ZOLOFT) 50 MG tablet Take 50 mg by mouth at bedtime.    10/06/2017 at 2000  . acetaminophen (TYLENOL) 500 MG tablet Take 1,000 mg by mouth every 6 (six) hours as needed for mild pain or headache.    Unknown at PRN    Current medications: Current Facility-Administered Medications  Medication Dose Route Frequency Provider Last Rate Last Dose  . 0.9 %  sodium chloride infusion  250 mL Intravenous PRN Salary, Montell D, MD      . 0.9 %  sodium chloride infusion  100 mL Intravenous PRN Cortavious Nix, MD      . 0.9 %  sodium chloride infusion  100 mL Intravenous PRN Angeligue Bowne, MD      . acetaminophen (TYLENOL) tablet 1,000 mg  1,000 mg Oral Q6H PRN Salary, Montell D, MD      . alteplase (CATHFLO ACTIVASE) injection 2 mg  2 mg Intracatheter Once PRN Anthonette Legato, MD      .  cinacalcet (SENSIPAR) tablet 90 mg  90 mg Oral Daily Salary, Montell D, MD      . gabapentin (NEURONTIN) capsule 100 mg  100 mg Oral QHS Salary, Montell D, MD   100 mg at 10/10/2017 2247  . heparin ADULT infusion 100 units/mL (25000 units/236mL sodium chloride 0.45%)  1,100 Units/hr Intravenous Continuous Nena Polio, MD 11 mL/hr at 10/08/17 0553 1,100 Units/hr at 10/08/17 0553  . heparin injection 1,000 Units  1,000 Units Dialysis PRN Reather Steller, MD      . HYDROcodone-acetaminophen (NORCO/VICODIN) 5-325 MG per tablet 1-2 tablet  1-2 tablet Oral Q4H PRN Gorden Harms, MD   2 tablet at 10/08/17 1118  . lanthanum (FOSRENOL) chewable tablet 1,000 mg  1,000 mg Oral TID WC Salary, Montell D, MD   1,000 mg at 10/08/17 0911  . lidocaine (PF) (XYLOCAINE) 1 % injection 5 mL  5 mL Intradermal PRN Tremain Rucinski, MD      . lidocaine-prilocaine (EMLA) cream 1 application  1 application  Topical PRN Jnaya Butrick, MD      . loperamide (IMODIUM) capsule 2 mg  2 mg Oral PRN Salary, Montell D, MD      . multivitamin (RENA-VIT) tablet 1 tablet  1 tablet Oral Daily Salary, Montell D, MD      . mupirocin ointment (BACTROBAN) 2 % 1 application  1 application Topical BID PRN Salary, Montell D, MD      . ondansetron (ZOFRAN) tablet 4 mg  4 mg Oral Q6H PRN Salary, Montell D, MD       Or  . ondansetron (ZOFRAN) injection 4 mg  4 mg Intravenous Q6H PRN Salary, Montell D, MD   4 mg at 10/08/17 0910  . pentafluoroprop-tetrafluoroeth (GEBAUERS) aerosol 1 application  1 application Topical PRN Fay Swider, MD      . polyethylene glycol (MIRALAX / GLYCOLAX) packet 17 g  17 g Oral Daily PRN Salary, Montell D, MD      . promethazine (PHENERGAN) tablet 25 mg  25 mg Oral Q6H PRN Salary, Montell D, MD      . sertraline (ZOLOFT) tablet 50 mg  50 mg Oral QHS Salary, Montell D, MD   50 mg at 10/14/2017 2247  . sodium chloride flush (NS) 0.9 % injection 3 mL  3 mL Intravenous Q12H Salary, Montell D, MD   3 mL at 10/08/17 0911  . sodium chloride flush (NS) 0.9 % injection 3 mL  3 mL Intravenous PRN Salary, Montell D, MD      . traZODone (DESYREL) tablet 50 mg  50 mg Oral QHS PRN Salary, Holly Bodily D, MD   50 mg at 10/06/2017 2247      Allergies: Allergies  Allergen Reactions  . Ciprofloxacin Other (See Comments)    IV only-burning to her veins, can take PO  . Erythromycin Anaphylaxis  . Chlorhexidine Rash  . Other Rash and Other (See Comments)    chloraprep      Past Medical History: Past Medical History:  Diagnosis Date  . Anemia   . Anxiety   . Cervical cancer (HCC)    Cervical and ovarian  . DVT (deep venous thrombosis) ([place])   . History of kidney stones   . Hypertension    treated years ago, on no medications for at least years ( as of 2019)  . Renal disorder    ESRD - stage 5 Dialysis T/Th/Sa  . Renal insufficiency      Past Surgical History: Past Surgical History:  Procedure Laterality Date  . AV FISTULA PLACEMENT Right 03/21/2017   Procedure: INSERTION OF ARTERIOVENOUS (AV) GORE-TEX GRAFT RIGHT THIGH;  Surgeon: Angelia Mould, MD;  Location: [facility];  Service: Vascular;  Laterality: Right;  . CYSTOSCOPY N/A 12/09/2016   Procedure: ENDOSCOPY OF ILEAL CONDUIT;  Surgeon: Irine Seal, MD;  Location: [place];  Service: Urology;  Laterality: N/A;  . GRAFT APPLICATION Right 29/47/6546   INSERTION OF ARTERIOVENOUS (AV) GORE-TEX GRAFT RIGHT THIGH   . ileal conduit    . IR FLUORO GUIDE CV LINE LEFT  06/15/2017  . IR FLUORO GUIDE CV LINE RIGHT  12/09/2016  . IR FLUORO GUIDE CV LINE RIGHT  06/07/2017  . IR NEPHROSTOMY EXCHANGE RIGHT  01/21/2017  . IR NEPHROSTOMY EXCHANGE RIGHT  04/13/2017  . IR NEPHROSTOMY EXCHANGE RIGHT  07/06/2017  . IR NEPHROSTOMY EXCHANGE RIGHT  07/28/2017  . IR PTA VENOUS EXCEPT DIALYSIS CIRCUIT  06/07/2017  . IR REMOVAL TUN CV CATH W/O FL  06/29/2017  . IR THROMBECTOMY AV FISTULA W/THROMBOLYSIS/PTA/STENT INC/SHUNT/IMG RT Right 12/09/2016  . IR US GUIDE VASC ACCESS LEFT  06/15/2017  . IR US GUIDE VASC ACCESS RIGHT  12/09/2016  . IR US GUIDE VASC ACCESS RIGHT  12/09/2016  . IR VENOCAVAGRAM IVC  06/07/2017  . nephrosotomy    . TONSILLECTOMY    . TUBAL LIGATION       Family History: Family History  Problem Relation Age of Onset  . Diabetes Mellitus II Mother   . Kidney disease Mother   . Heart disease Mother   . Rheum arthritis Mother   . COPD Mother   . Diabetes Mellitus II Father   . Heart disease Father   . Dementia Father   . Ovarian cancer Maternal Aunt      Social History: Social History   Socioeconomic History  . Marital status: Single    Spouse name: Not on file  . Number of children: Not on file  . Years of education: Not on file  . Highest education level: Not on file  Occupational History  . Not on file  Social Needs  . Financial resource strain: Not on file  . Food insecurity:    Worry: Not on file    Inability:  Not on file  . Transportation needs:    Medical: Not on file    Non-medical: Not on file  Tobacco Use  . Smoking status: Never Smoker  . Smokeless tobacco: Never Used  Substance and Sexual Activity  . Alcohol use: Yes    Frequency: Never    Comment: occasional wine  . Drug use: No  . Sexual activity: Not on file  Lifestyle  . Physical activity:    Days per week: Not on file    Minutes per session: Not on file  . Stress: Not on file  Relationships  . Social connections:    Talks on phone: Not on file    Gets together: Not on file    Attends religious service: Not on file    Active member of club or organization: Not on file    Attends meetings of clubs or organizations: Not on file    Relationship status: Not on file  . Intimate partner violence:    Fear of current or ex partner: Not on file    Emotionally abused: Not on file    Physically abused: Not on file    Forced sexual activity: Not on file  Other Topics Concern  . Not on  file  Social History Narrative  . Not on file     Review of Systems: Review of Systems  Constitutional: Negative for chills, fever and malaise/fatigue.  HENT: Negative for congestion, hearing loss and tinnitus.   Eyes: Negative for blurred vision and double vision.  Respiratory: Positive for shortness of breath. Negative for hemoptysis.   Cardiovascular: Positive for chest pain and leg swelling.  Gastrointestinal: Negative for heartburn, nausea and vomiting.  Genitourinary: Negative for dysuria and urgency.  Musculoskeletal: Negative for back pain and myalgias.  Skin: Negative for itching and rash.  Neurological: Positive for dizziness. Negative for loss of consciousness.  Endo/Heme/Allergies: Negative for polydipsia. Does not bruise/bleed easily.  Psychiatric/Behavioral: Negative for memory loss. The patient is not nervous/anxious.      Vital Signs: Blood pressure 106/71, pulse 88, temperature 98 F (36.7 C), temperature source Oral,  resp. rate 18, height 5\' 7"  (1.702 m), weight 120 kg, SpO2 (!) (P) 88 %.  Weight trends: Filed Weights   10/21/2017 1805 10/08/2017 2216  Weight: 121 kg 120 kg    Physical Exam: General: NAD, resting comfortably  Head: Normocephalic, atraumatic.  Eyes: Anicteric, EOMI  Nose: Mucous membranes moist, not inflammed, nonerythematous.  Throat: Oropharynx nonerythematous, no exudate appreciated.   Neck: Supple, trachea midline.  Lungs:  Normal respiratory effort. Clear to auscultation BL without crackles or wheezes.  Heart: RRR. S1 and S2 normal without gallop, murmur, or rubs.  Abdomen:  BS normoactive. Soft, Nondistended, non-tender.  No masses or organomegaly.  Extremities: No pretibial edema.  Neurologic: A&O X3, Motor strength is 5/5 in the all 4 extremities  Skin: No visible rashes, scars.    Lab results: Basic Metabolic Panel: Recent Labs  Lab 10/12/2017 1831 10/08/17 0326  NA 138 138  K 5.1 5.0  CL 97* 98  CO2 24 23  GLUCOSE 141* 111*  BUN 36* 42*  CREATININE 8.21* 9.15*  CALCIUM 9.0 8.9    Liver Function Tests: Recent Labs  Lab 10/26/2017 1831  AST 29  ALT 10  ALKPHOS 65  BILITOT 0.5  PROT 8.0  ALBUMIN 4.1   No results for input(s): LIPASE, AMYLASE in the last 168 hours. No results for input(s): AMMONIA in the last 168 hours.  CBC: Recent Labs  Lab 10/27/2017 1831 10/08/17 0326  WBC 7.6 6.2  NEUTROABS 6.1  --   HGB 12.1 11.4*  HCT 37.6 35.0  MCV 98.4 97.6  PLT 237 227    Cardiac Enzymes: Recent Labs  Lab 10/23/2017 1831  TROPONINI 0.75*    BNP: Invalid input(s): POCBNP  CBG: No results for input(s): GLUCAP in the last 168 hours.  Microbiology: Results for orders placed or performed during the hospital encounter of 11/02/2017  MRSA PCR Screening     Status: None   Collection Time: 10/08/2017 11:03 PM  Result Value Ref Range Status   MRSA by PCR NEGATIVE NEGATIVE Final    Comment:        The GeneXpert MRSA Assay (FDA approved for NASAL  specimens only), is one component of a comprehensive MRSA colonization surveillance program. It is not intended to diagnose MRSA infection nor to guide or monitor treatment for MRSA infections. Performed at [facility], [address] [place], [place] [address]     Coagulation Studies: Recent Labs    10/18/2017 1904  LABPROT 14.9  INR 1.18    Urinalysis: No results for input(s): COLORURINE, LABSPEC, PHURINE, GLUCOSEU, HGBUR, BILIRUBINUR, KETONESUR, PROTEINUR, UROBILINOGEN, NITRITE, LEUKOCYTESUR in the last 72  hours.  Invalid input(s): APPERANCEUR    Imaging: Ct Angio Chest Pe W And/or Wo Contrast  Result Date: 10/17/2017 CLINICAL DATA:  Acute onset chest pain and dizziness today. EXAM: CT ANGIOGRAPHY CHEST WITH CONTRAST TECHNIQUE: Multidetector CT imaging of the chest was performed using the standard protocol during bolus administration of intravenous contrast. Multiplanar CT image reconstructions and MIPs were obtained to evaluate the vascular anatomy. CONTRAST:  75 mL OMNIPAQUE IOHEXOL 350 MG/ML SOLN COMPARISON:  Single-view of the chest this same day. FINDINGS: Cardiovascular: Pulmonary emboli are seen bilaterally. The RV to LV ratio is 2.1 compatible with right heart strain. The right ventricle is bowed into the left ventricle. Cardiomegaly is identified. No pericardial effusion. No aortic aneurysm. Bilateral central venous catheters are in place with their tips in the lower superior vena cava and just within the right atrium. Mediastinum/Nodes: No enlarged mediastinal, hilar, or axillary lymph nodes. Thyroid gland, trachea, and esophagus demonstrate no significant findings. Lungs/Pleura: No pleural effusion. The lungs demonstrate only mild dependent atelectasis. Upper Abdomen: Negative. Musculoskeletal: Negative. Review of the MIP images confirms the above findings. IMPRESSION: Positive for acute PE with CT evidence of right heart strain (RV/LV Ratio = 2.1) consistent with  at least submassive (intermediate risk) PE. The presence of right heart strain has been associated with an increased risk of morbidity and mortality. Please activate Code PE by paging [phone]. Critical Value/emergent results were called by telephone at the time of interpretation on 11/03/2017 at 6:57 pm to Dr. Conni Slipper , who verbally acknowledged these results. Electronically Signed   By: Inge Rise M.D.   On: 10/08/2017 19:00   US Venous Img Lower Bilateral  Result Date: 10/08/2017 CLINICAL DATA:  52 year old female with pulmonary embolism EXAM: BILATERAL LOWER EXTREMITY VENOUS DOPPLER ULTRASOUND TECHNIQUE: Gray-scale sonography with graded compression, as well as color Doppler and duplex ultrasound were performed to evaluate the lower extremity deep venous systems from the level of the common femoral vein and including the common femoral, femoral, profunda femoral, popliteal and calf veins including the posterior tibial, peroneal and gastrocnemius veins when visible. The superficial great saphenous vein was also interrogated. Spectral Doppler was utilized to evaluate flow at rest and with distal augmentation maneuvers in the common femoral, femoral and popliteal veins. COMPARISON:  CT abdomen 06/11/2017 FINDINGS: RIGHT LOWER EXTREMITY Common Femoral Vein: No evidence of thrombus. Normal compressibility. Blunted phasicity. Saphenofemoral Junction: No evidence of thrombus. Normal compressibility and flow on color Doppler imaging. Profunda Femoral Vein: No evidence of thrombus. Normal compressibility and flow on color Doppler imaging. Femoral Vein: No evidence of thrombus. Normal compressibility, respiratory phasicity and response to augmentation. Popliteal Vein: No evidence of thrombus. Normal compressibility, respiratory phasicity and response to augmentation. Calf Veins: No evidence of thrombus. Normal compressibility and flow on color Doppler imaging. Superficial Great Saphenous Vein: No  evidence of thrombus. Normal compressibility and flow on color Doppler imaging. Other Findings:  None. LEFT LOWER EXTREMITY Common Femoral Vein: Noncompressible with flow maintained and evidence of chronic DVT. Blunted phasicity with venous hum appearance of the duplex. Saphenofemoral Junction: No evidence of thrombus. Normal compressibility and flow on color Doppler imaging. Profunda Femoral Vein: No evidence of thrombus. Normal compressibility and flow on color Doppler imaging. Femoral Vein: Noncompressible with flow maintained. Blunted phasicity. Popliteal Vein: Noncompressible with flow maintained. Blunted phasicity Calf Veins: No evidence of thrombus. Normal compressibility and flow on color Doppler imaging. Superficial Great Saphenous Vein: No evidence of thrombus. Normal compressibility and flow on color Doppler imaging.  Venous Reflux:  None. Other Findings:  None. IMPRESSION: Right: Sonographic survey of the right negative for DVT. Blunted phasicity may indicate more proximal venous occlusion/stenosis. Left: Sonographic survey of the left is positive for nonocclusive proximal DVT of the common femoral vein, femoral vein, and popliteal vein. 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 Electronically Signed   By: Corrie Mckusick D.O.   On: 10/08/2017 12:31   Dg Chest Portable 1 View  Result Date: 10/12/2017 CLINICAL DATA:  Chest pain and short of breath EXAM: PORTABLE CHEST 1 VIEW COMPARISON:  09/03/2017 FINDINGS: Right-sided central venous catheter tip over the SVC. Left-sided central venous catheter tip over the mid right atrium. Mild cardiomegaly with slight central congestion. No focal opacity or pleural effusion. No pneumothorax. Vascular stents in the right upper extremity. IMPRESSION: 1. Bilateral central venous catheters, on the right tip projects over the distal SVC, on the left hip projects over mid  right atrium 2. Mild cardiomegaly with central vascular congestion. Electronically Signed   By: Donavan Foil M.D.   On: 11/01/2017 18:30      Assessment & Plan: Pt is a 52 y.o. female with a PMHx of end-stage renal disease on hemodialysis TTS, hypertension, nephrolithiasis, DVT, cervical cancer, anxiety, anemia of chronic kidney disease, secondary hyperparathyroidism, who was admitted to [facility] on 10/23/2017 for evaluation of chest pain.    Dialysis Orders:  [address]/TTHS/EDW 119  1.  ESRD on HD TTS.  Patient seen and evaluated during hemodialysis.  Tolerating well.  Ultrafiltration target 1.5 kg thus far.  2.  Anemia of chronic kidney disease grade hemoglobin currently 11.4.  Hold off on Epogen or Mircera at this time.  3.  Secondary hyperparathyroidism.  Maintain the patient on Fosrenol as well as cinacalcet.  4.  Acute pulmonary embolism.  Maintain the patient on heparin drip for now.

## 2017-10-08 NOTE — Progress Notes (Signed)
Talked to Dr. Estanislado Pandy about patient's BP of 88/59 has been soft after dialysis and patient is also asking for pain medication for chest pain, could not give narcotic due to BP. No orders given will continue to monitor.

## 2017-10-09 ENCOUNTER — Ambulatory Visit: Admission: RE | Admit: 2017-10-09 | Payer: Medicare Other | Source: Ambulatory Visit

## 2017-10-09 LAB — GLUCOSE, CAPILLARY: Glucose-Capillary: 128 mg/dL — ABNORMAL HIGH (ref 70–99)

## 2017-10-10 LAB — PARATHYROID HORMONE, INTACT (NO CA): PTH: 437 pg/mL — AB (ref 15–65)

## 2017-10-20 ENCOUNTER — Other Ambulatory Visit (HOSPITAL_COMMUNITY): Payer: Medicare Other

## 2017-11-04 NOTE — ED Provider Notes (Signed)
[facility] Department of Emergency Medicine   Code Blue CONSULT NOTE  Chief Complaint: Cardiac arrest/unresponsive   Level V Caveat: Unresponsive  History of present illness: I was contacted by the hospital for a CODE BLUE cardiac arrest upstairs and presented to the patient's bedside.   Upon arrival, the patient had agonal respirations with a heart rate of 106 on telemetry.  Pulseless.  ROS: Unable to obtain, Level V caveat  Scheduled Meds: . cinacalcet  90 mg Oral Daily  . gabapentin  100 mg Oral QHS  . lanthanum  1,000 mg Oral TID WC  . multivitamin  1 tablet Oral Daily  . sertraline  50 mg Oral QHS  . sodium chloride flush  3 mL Intravenous Q12H   Continuous Infusions: . sodium chloride    . heparin 800 Units/hr (10/08/17 2229)   PRN Meds:.sodium chloride, acetaminophen, HYDROcodone-acetaminophen, loperamide, mupirocin ointment, ondansetron **OR** ondansetron (ZOFRAN) IV, polyethylene glycol, promethazine, sodium chloride flush, traZODone Past Medical History:  Diagnosis Date  . Anemia   . Anxiety   . Cervical cancer (HCC)    Cervical and ovarian  . DVT (deep venous thrombosis) ([place])   . History of kidney stones   . Hypertension    treated years ago, on no medications for at least years ( as of 2019)  . Renal disorder    ESRD - stage 5 Dialysis T/Th/Sa  . Renal insufficiency    Past Surgical History:  Procedure Laterality Date  . AV FISTULA PLACEMENT Right 03/21/2017   Procedure: INSERTION OF ARTERIOVENOUS (AV) GORE-TEX GRAFT RIGHT THIGH;  Surgeon: Angelia Mould, MD;  Location: [facility];  Service: Vascular;  Laterality: Right;  . CYSTOSCOPY N/A 12/09/2016   Procedure: ENDOSCOPY OF ILEAL CONDUIT;  Surgeon: Irine Seal, MD;  Location: [place];  Service: Urology;  Laterality: N/A;  . GRAFT APPLICATION Right 40/98/1191   INSERTION OF ARTERIOVENOUS (AV) GORE-TEX GRAFT RIGHT THIGH   . ileal conduit    . IR FLUORO GUIDE CV LINE LEFT  06/15/2017   . IR FLUORO GUIDE CV LINE RIGHT  12/09/2016  . IR FLUORO GUIDE CV LINE RIGHT  06/07/2017  . IR NEPHROSTOMY EXCHANGE RIGHT  01/21/2017  . IR NEPHROSTOMY EXCHANGE RIGHT  04/13/2017  . IR NEPHROSTOMY EXCHANGE RIGHT  07/06/2017  . IR NEPHROSTOMY EXCHANGE RIGHT  07/28/2017  . IR PTA VENOUS EXCEPT DIALYSIS CIRCUIT  06/07/2017  . IR REMOVAL TUN CV CATH W/O FL  06/29/2017  . IR THROMBECTOMY AV FISTULA W/THROMBOLYSIS/PTA/STENT INC/SHUNT/IMG RT Right 12/09/2016  . IR US GUIDE VASC ACCESS LEFT  06/15/2017  . IR US GUIDE VASC ACCESS RIGHT  12/09/2016  . IR US GUIDE VASC ACCESS RIGHT  12/09/2016  . IR VENOCAVAGRAM IVC  06/07/2017  . nephrosotomy    . TONSILLECTOMY    . TUBAL LIGATION     Social History   Socioeconomic History  . Marital status: Single    Spouse name: Not on file  . Number of children: Not on file  . Years of education: Not on file  . Highest education level: Not on file  Occupational History  . Not on file  Social Needs  . Financial resource strain: Not on file  . Food insecurity:    Worry: Not on file    Inability: Not on file  . Transportation needs:    Medical: Not on file    Non-medical: Not on file  Tobacco Use  . Smoking status: Never Smoker  . Smokeless tobacco: Never  Used  Substance and Sexual Activity  . Alcohol use: Yes    Frequency: Never    Comment: occasional wine  . Drug use: No  . Sexual activity: Not on file  Lifestyle  . Physical activity:    Days per week: Not on file    Minutes per session: Not on file  . Stress: Not on file  Relationships  . Social connections:    Talks on phone: Not on file    Gets together: Not on file    Attends religious service: Not on file    Active member of club or organization: Not on file    Attends meetings of clubs or organizations: Not on file    Relationship status: Not on file  . Intimate partner violence:    Fear of current or ex partner: Not on file    Emotionally abused: Not on file    Physically abused: Not on  file    Forced sexual activity: Not on file  Other Topics Concern  . Not on file  Social History Narrative  . Not on file   Allergies  Allergen Reactions  . Ciprofloxacin Other (See Comments)    IV only-burning to her veins, can take PO  . Erythromycin Anaphylaxis  . Chlorhexidine Rash  . Other Rash and Other (See Comments)    chloraprep    Last set of Vital Signs (not current) Vitals:   10/08/17 1808 10/08/17 1935  BP: (!) 97/59 (!) 88/59  Pulse: 89 98  Resp:  16  Temp:  97.9 F (36.6 C)  SpO2:  90%      Physical Exam  Gen: unresponsive Cardiovascular: pulseless  Resp: apneic. Breath sounds equal bilaterally with bagging  Abd: nondistended  Neuro: GCS 3, unresponsive to pain  HEENT: No blood in posterior pharynx, gag reflex absent  Neck: No crepitus  Musculoskeletal: No deformity  Skin: warm  Procedures  INTUBATION Performed by: Doran Stabler Required items: required blood products, implants, devices, and special equipment available Patient identity confirmed: provided demographic data and hospital-assigned identification number Time out: Immediately prior to procedure a "time out" was called to verify the correct patient, procedure, equipment, support staff and site/side marked as required. Indications: Agonal respirations.  Airway protection. Intubation method: Direct laryngoscopy. Preoxygenation: BVM Sedatives: None Paralytic: None Tube Size: 8 cuffed Post-procedure assessment: chest rise and ETCO2 monitor Breath sounds: equal and absent over the epigastrium Tube secured by Respiratory Therapy Patient tolerated the procedure well with no immediate complications.  CRITICAL CARE Performed by: Doran Stabler Total critical care time: 15 Critical care time was exclusive of separately billable procedures and treating other patients. Critical care was necessary to treat or prevent imminent or life-threatening deterioration. Critical care was time  spent personally by me on the following activities: development of treatment plan with patient and/or surrogate as well as nursing, discussions with consultants, evaluation of patient's response to treatment, examination of patient, obtaining history from patient or surrogate, ordering and performing treatments and interventions, ordering and review of laboratory studies, ordering and review of radiographic studies, pulse oximetry and re-evaluation of patient's condition.  Cardiopulmonary Resuscitation (CPR) Procedure Note  Directed/Performed by: Doran Stabler I personally directed ancillary staff and/or performed CPR in an effort to regain return of spontaneous circulation and to maintain cardiac, neuro and systemic perfusion.    Medical Decision making  Hospitalist, Dr. Jannifer Franklin, also present.  While intubating Dr. Jannifer Franklin is running the code.  He is directing ACLS protocol/medications.  Thrombolytics discussed.  Due to prolonged CPR as well as patient already anticoagulated with heparin thrombo-lytics were decided against.  Assessment and Plan  Code still in process, Dr. Jannifer Franklin directing ACLS protocols.  Respiratory at the head of the bed bagging the patient who continues to have equal chest rise without any breath sounds over the epigastrium with ET CO2 monitor with positive color change.  Code taken over by hospitalist at this time.    Orbie Pyo, MD Oct 26, 2017 [phone]

## 2017-11-04 NOTE — Progress Notes (Signed)
This RN was called to the patient's room around [place] because the patient's IV was alarming. Patient was sitting up on the side of the bed and informed this RN that she had vomited in the floor.  This RN went to get Zofran to administer to the patient and a tech to help clean her up.  This RN and the tech took the patient off the cardiac monitor and gave her a bath.  The patient complained of pain all over her body and said she needed to lay down.  This RN proceeded to get vital signs as the patient's blood pressure had been low since dialysis on 10/5.  The tech helped patient put her feet in the bed and put her oxygen back in her nose, which the patient had taken off.  The tech called out loudly to the patient and this RN noted the patient had a stiff posture and was not responding.  We called a code and compressions were started.  The patient did not respond to staff, but had respirations and a faint pulse.  When the attending came into the room, he assessed the patient and noted agonal respirations. The patient then lost pulses and CPR was started.

## 2017-11-04 NOTE — Progress Notes (Signed)
Per VC, this RN called security to secure belongings.  Patient's belongings are at the desk awaiting pickup by the security sergeant. Valuables include phone, glasses, 4 rings, and wallet with debit card, ID cards, and government cards. Security said they could take valuables, but not the clothes and 2 tote bags. AC cannot keep bags in office due to lack of room.  Security and [facility] are aware that belongings cannot stay at nurses' station and the patient is already in the morgue.

## 2017-11-04 NOTE — Discharge Summary (Signed)
[place] at [facility]    Death Note - please see Last Note for all details.  PATIENT NAME: Judy Kelley    MR#:  [id number]  DATE OF BIRTH:  October 12, 1965  DATE OF ADMISSION:  10/18/2017  PRIMARY CARE PHYSICIAN: Patient, No Pcp Per   ADMISSION DIAGNOSIS:  Acute saddle pulmonary embolism with acute cor pulmonale (HCC) [I26.02] PE (pulmonary thromboembolism) (HCC) [I26.99]  HISTORY OF PRESENT ILLNESS ON ADMISSION (as per Dr Jerelyn Charles):  Judy Kelley  is a 52 y.o. 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  HOSPITAL COURSE:  Patient was started on heparin drip on admission.  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  On CT scan admission her pulmonary emboli were found to be submassive with some mild amount of right heart strain.  She was being followed by pulmonology, and had consults ordered though not yet completed for vascular surgery and hematology as well.  Strong suspicion for clotting disorder given that she had recurrent clots despite anticoagulation.  Tonight nursing was performing CHG wipe down for the patient when she had an acute onset of chest pain associated with shortness of breath and quickly became unresponsive.  CODE BLUE was called though patient reportedly became responsive again for a very brief period of time.  However, when code team got there she was again unresponsive and her  breathing was shallow borderline agonal.  She was found to be pulseless and CPR was initiated.  ACLS protocol was followed for 22 minutes with total 6 doses of epi given.  Pulse was never regained.  Her rhythm was consistently PEA.  Strong suspicion here that she likely expanded her PE, potentially by dislodging another portion of her DVT subsequent migration to her long and possible complete obstruction of her pulmonary arteries/saddle embolus.    Pronounced dead by Judy Kelley, Judy Kelley on 10-27-2017 at 01:29 after 1 full minute of auscultation revealed absent heart and lung sounds, absent corneal and pupillary reflexes, and absent response to painful stimuli.                  Cause of death: Pulmonary emboli   Judy Kelley Oct 27, 2017, 1:51 AM  [facility] [phone]  Total clinical and documentation time for today: <30 minutes  Note:  This document was prepared using Dragon voice recognition software and may include unintentional dictation errors.

## 2017-11-04 NOTE — Progress Notes (Signed)
   Nov 06, 2017 0100  Clinical Encounter Type  Visited With Patient  Visit Type Code   Chaplain responded to the Code Blue and offered prayer for the patient and care team. After inquiring about family, Chaplain contacted the patient's friend, Patricia Martinique, at [phone], and gave her information on the situation. Further, Chaplain called the patient's pastor, Paulla Dolly, at [phone] and left him a message about the patient's death.  Chaplain will relay further communications to Optometrist.

## 2017-11-04 DEATH — deceased

## 2018-10-14 IMAGING — XA IR EXCHANGE NEPHROSTOMY RIGHT
2 series · 8 of 8 positions shown · non-contrast
Comparison: None.

INDICATION: History of bladder carcinoma with chronic indwelling right
percutaneous nephrostomy tube.

EXAM:
EXCHANGE OF RIGHT PERCUTANEOUS NEPHROSTOMY TUBE

[Series 3: fl - angio · 4 of 32 frames shown]
[frame 5/32]
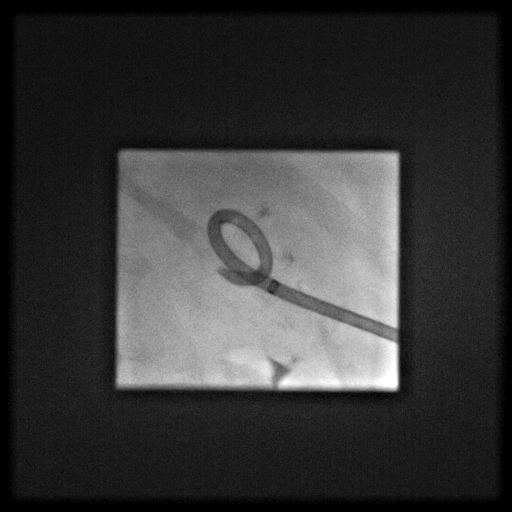
[frame 10/32]
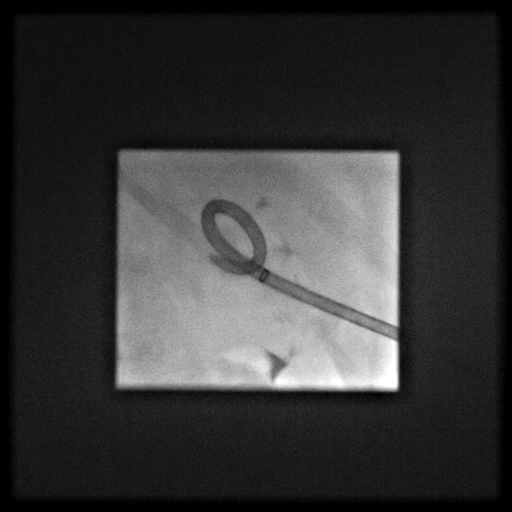
[frame 17/32]
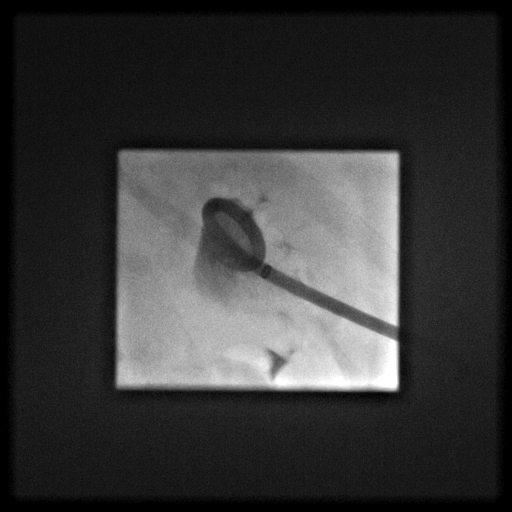
[frame 28/32]
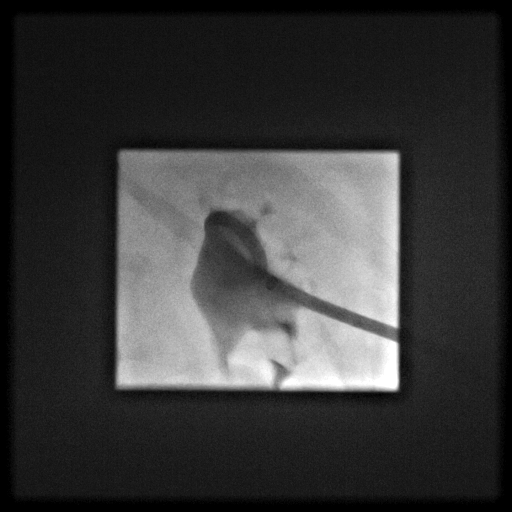

[Series 300: ir nephrostomy exchange right · 4 of 4 slices shown]
[im 1/4]
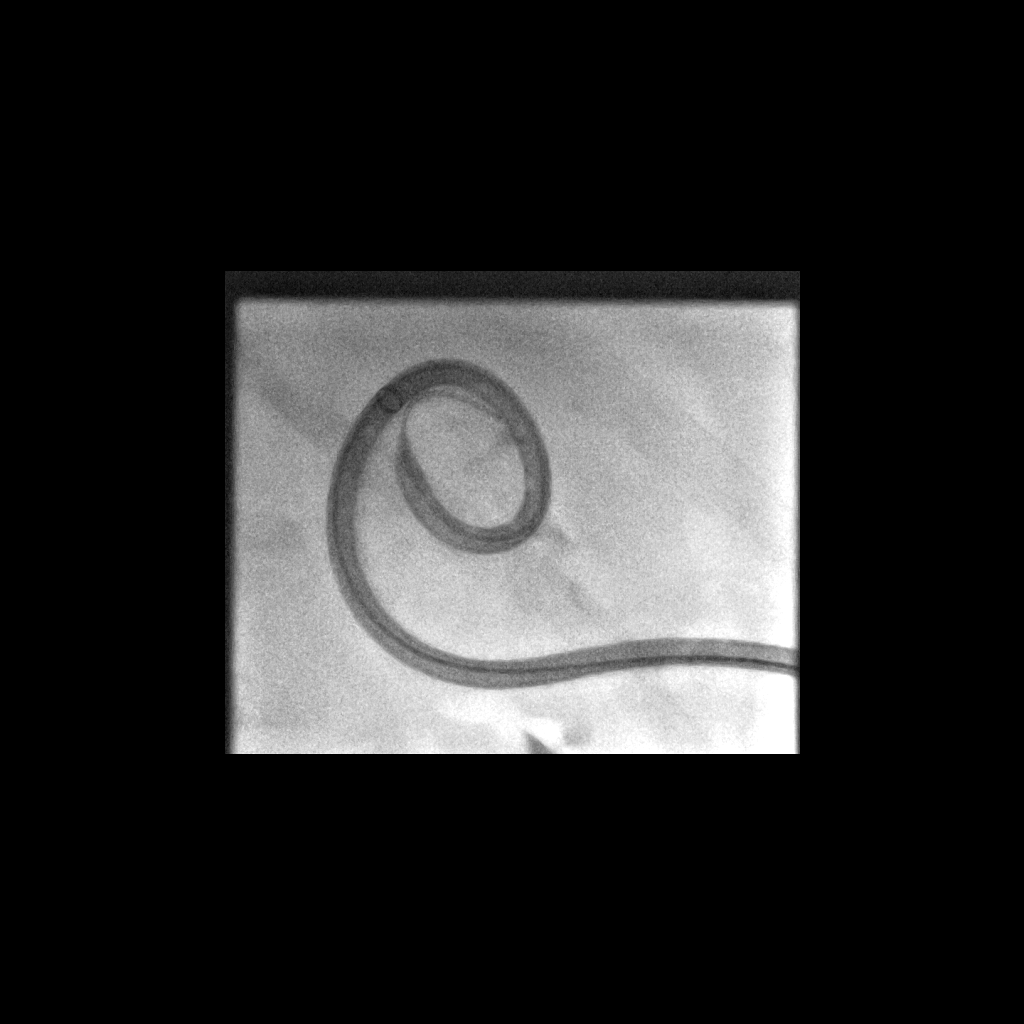
[im 2/4]
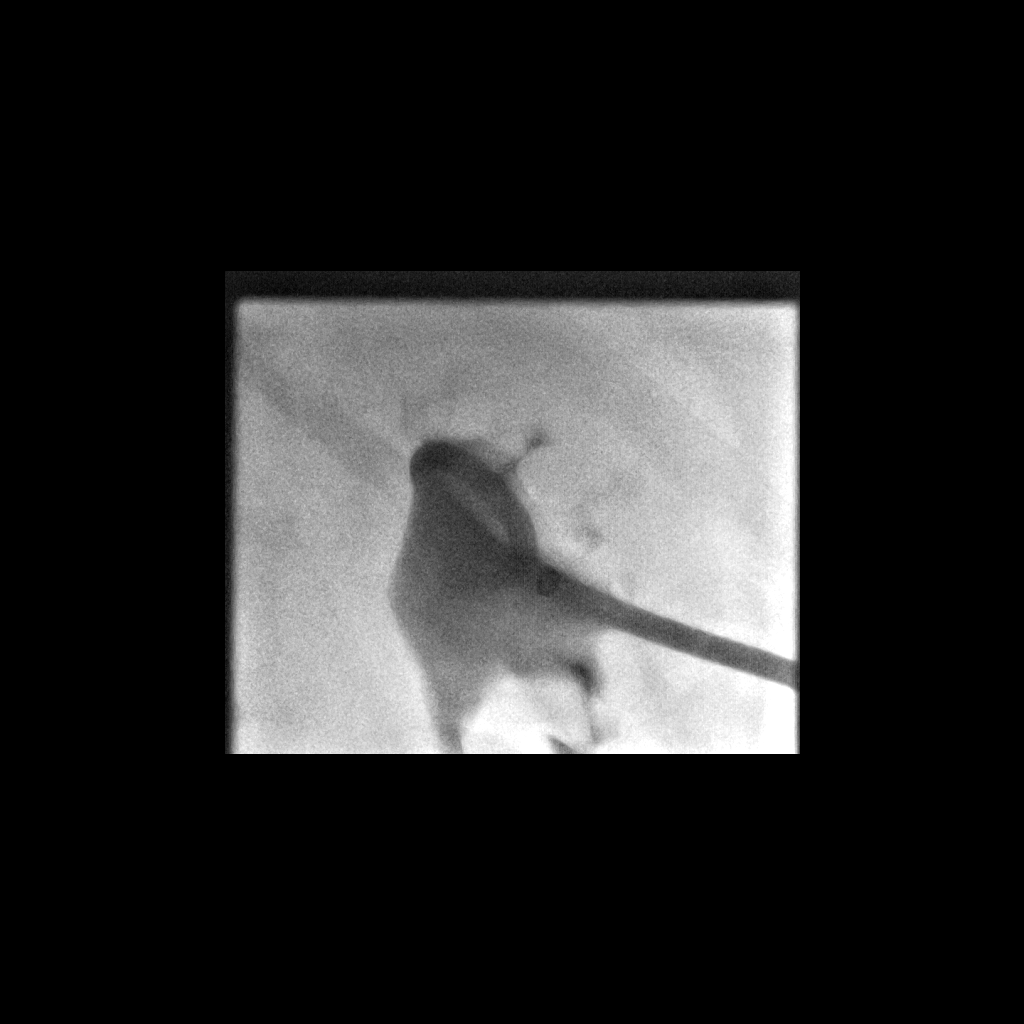
[im 3/4]
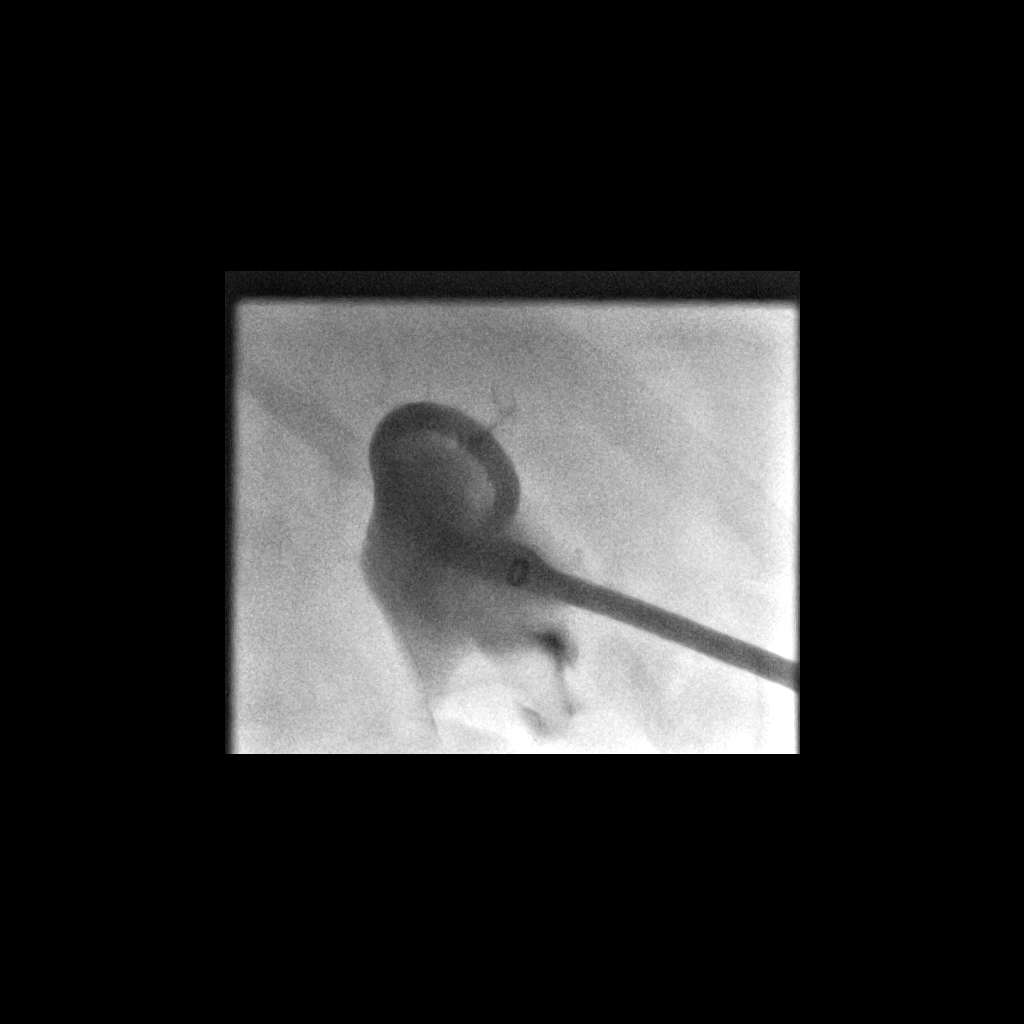
[im 4/4]
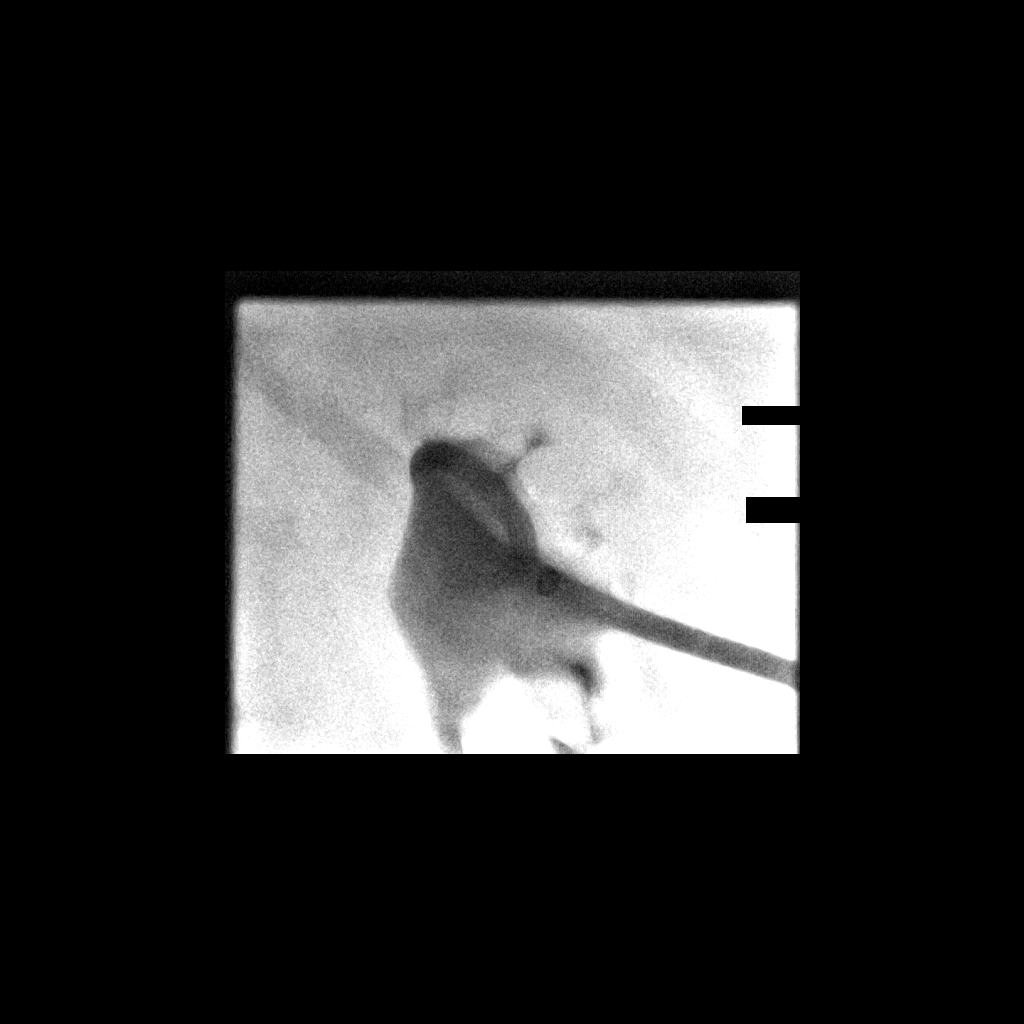

[8 of 8 positions shown; findings below may reference images not displayed]

MEDICATIONS:
None

ANESTHESIA/SEDATION:
None

CONTRAST:  10 mL Isovue-R88-administered into the collecting
system(s)

FLUOROSCOPY TIME:  Fluoroscopy Time: 42 seconds.  17 mGy.

COMPLICATIONS:
None immediate.

PROCEDURE:
Informed written consent was obtained from the patient after a
thorough discussion of the procedural risks, benefits and
alternatives. All questions were addressed. Maximal Sterile Barrier
Technique was utilized including caps, mask, sterile gowns, sterile
gloves, sterile drape, hand hygiene and skin antiseptic. A timeout
was performed prior to the initiation of the procedure.

A pre-existing 14 French nephrostomy tube was injected with contrast
material under fluoroscopy. The catheter was cut and removed over a
guidewire. A new 14 French catheter was advanced over a guidewire
and formed. The catheter was injected with contrast material and a
fluoroscopic spot image obtained to confirm catheter position. The
catheter was connected to a gravity drainage bag.
IMPRESSION: Exchange of indwelling right percutaneous nephrostomy tube.

## 2019-04-09 IMAGING — DX DG CHEST 1V PORT
2 series · 2 of 2 positions shown · non-contrast
Comparison: 09/03/2017

CLINICAL DATA: Chest pain and short of breath

EXAM:
PORTABLE CHEST 1 VIEW

[chest ap (1 of 2)]
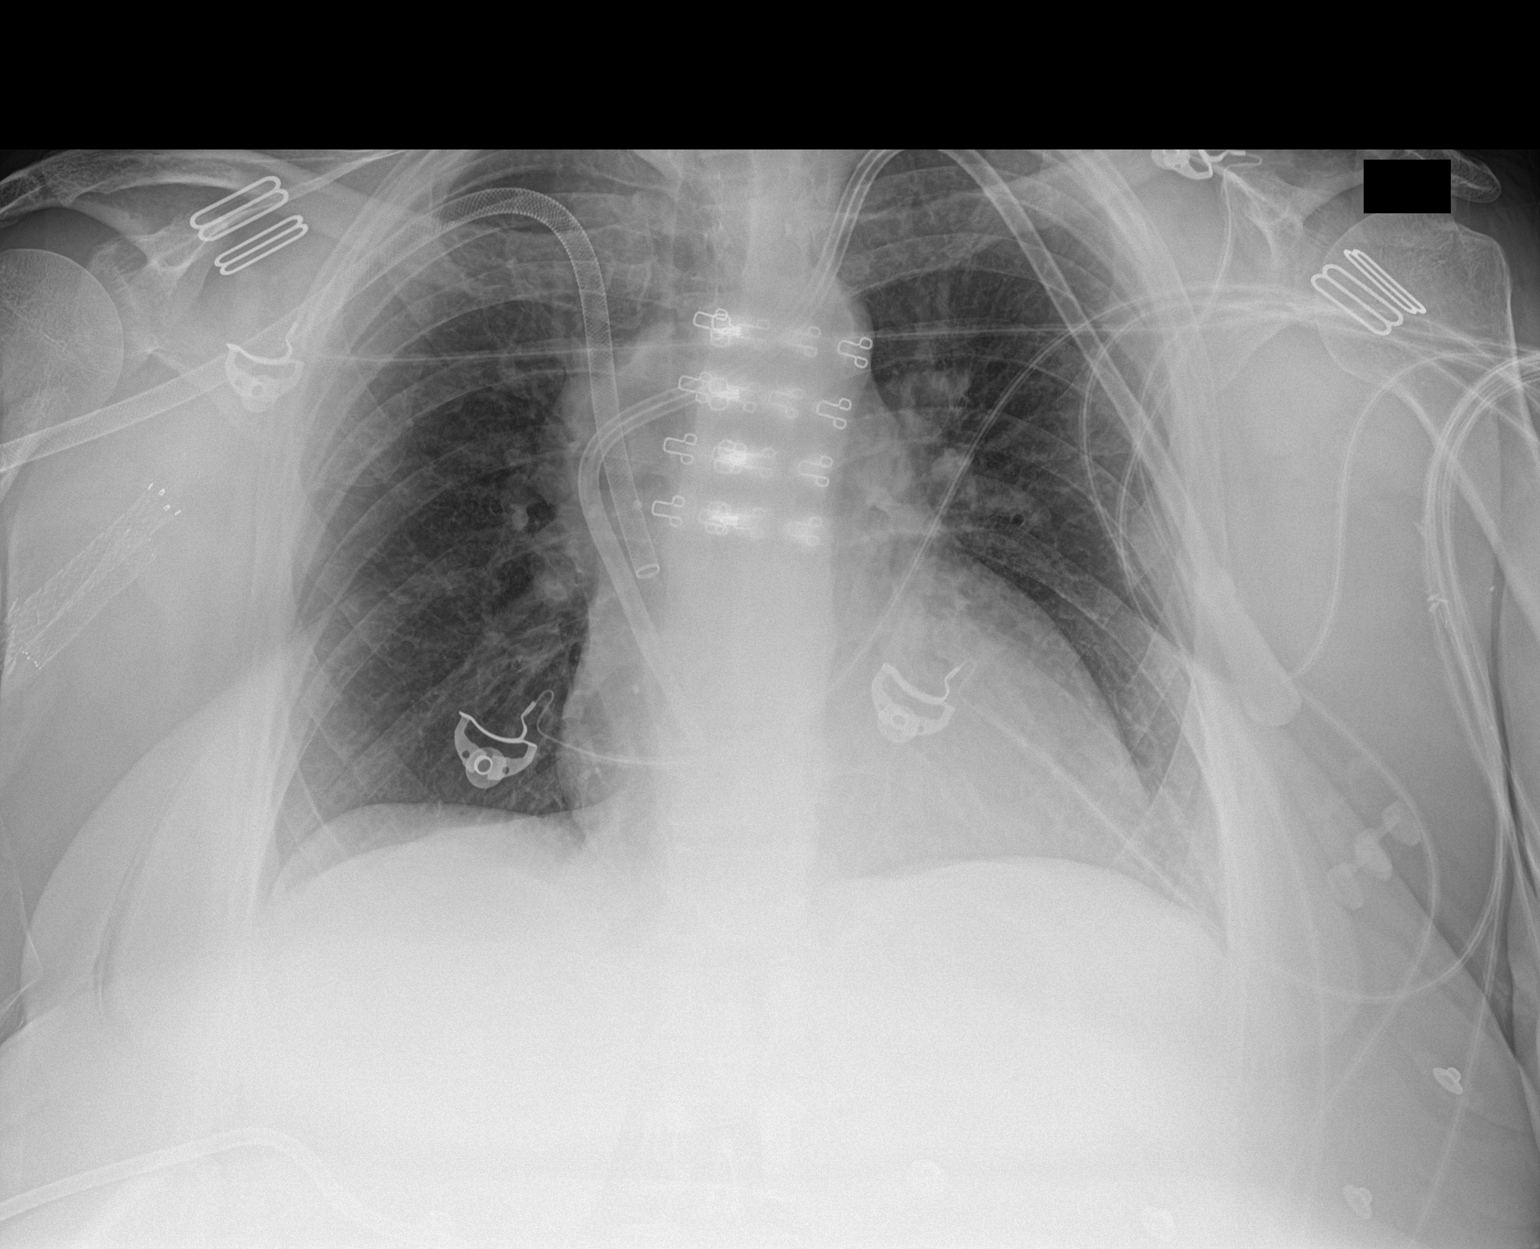

[chest ap (2 of 2)]
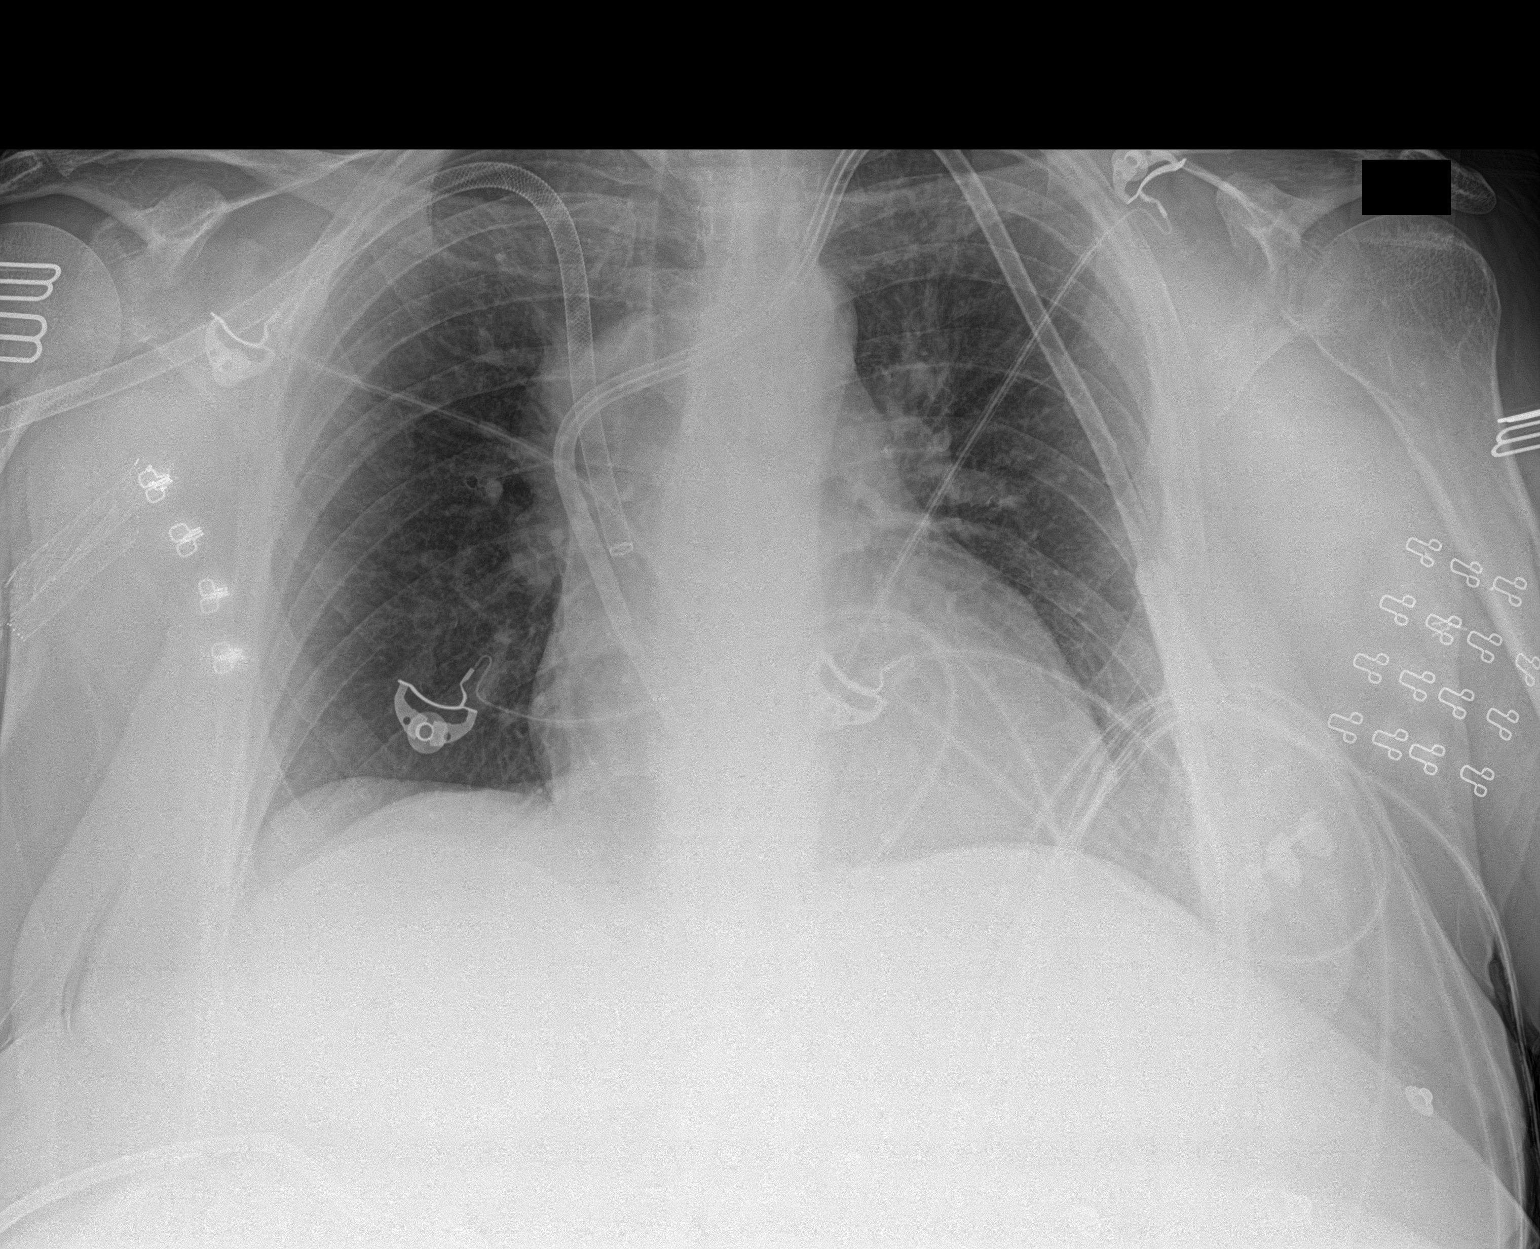

[2 of 2 positions shown; findings below may reference images not displayed]

FINDINGS: Right-sided central venous catheter tip over the SVC. Left-sided
central venous catheter tip over the mid right atrium. Mild
cardiomegaly with slight central congestion. No focal opacity or
pleural effusion. No pneumothorax. Vascular stents in the right
upper extremity.
IMPRESSION: 1. Bilateral central venous catheters, on the right tip projects
over the distal SVC, on the left hip projects over mid right atrium
2. Mild cardiomegaly with central vascular congestion.
